# Patient Record
Sex: Male | Born: 1955 | Race: White | Hispanic: No | Marital: Married | Smoking: Former smoker
Health system: Southern US, Community
[De-identification: ages and names within clinical notes are randomized; demographics above are authoritative.]

## PROBLEM LIST (undated history)

## (undated) DIAGNOSIS — K439 Ventral hernia without obstruction or gangrene: Secondary | ICD-10-CM

## (undated) DIAGNOSIS — I739 Peripheral vascular disease, unspecified: Secondary | ICD-10-CM

## (undated) DIAGNOSIS — N200 Calculus of kidney: Secondary | ICD-10-CM

## (undated) DIAGNOSIS — I219 Acute myocardial infarction, unspecified: Secondary | ICD-10-CM

## (undated) DIAGNOSIS — E785 Hyperlipidemia, unspecified: Secondary | ICD-10-CM

## (undated) DIAGNOSIS — T451X5A Adverse effect of antineoplastic and immunosuppressive drugs, initial encounter: Secondary | ICD-10-CM

## (undated) DIAGNOSIS — R110 Nausea: Secondary | ICD-10-CM

## (undated) DIAGNOSIS — E663 Overweight: Secondary | ICD-10-CM

## (undated) DIAGNOSIS — C259 Malignant neoplasm of pancreas, unspecified: Secondary | ICD-10-CM

## (undated) DIAGNOSIS — R9430 Abnormal result of cardiovascular function study, unspecified: Secondary | ICD-10-CM

## (undated) DIAGNOSIS — I2510 Atherosclerotic heart disease of native coronary artery without angina pectoris: Secondary | ICD-10-CM

## (undated) DIAGNOSIS — I779 Disorder of arteries and arterioles, unspecified: Secondary | ICD-10-CM

## (undated) DIAGNOSIS — J90 Pleural effusion, not elsewhere classified: Secondary | ICD-10-CM

## (undated) DIAGNOSIS — IMO0002 Reserved for concepts with insufficient information to code with codable children: Secondary | ICD-10-CM

## (undated) DIAGNOSIS — E119 Type 2 diabetes mellitus without complications: Secondary | ICD-10-CM

## (undated) DIAGNOSIS — K5900 Constipation, unspecified: Secondary | ICD-10-CM

## (undated) DIAGNOSIS — H269 Unspecified cataract: Secondary | ICD-10-CM

## (undated) HISTORY — DX: Hyperlipidemia, unspecified: E78.5

## (undated) HISTORY — DX: Abnormal result of cardiovascular function study, unspecified: R94.30

## (undated) HISTORY — PX: CORONARY ANGIOPLASTY: SHX604

## (undated) HISTORY — PX: KIDNEY STONE SURGERY: SHX686

## (undated) HISTORY — DX: Overweight: E66.3

## (undated) HISTORY — DX: Disorder of arteries and arterioles, unspecified: I77.9

## (undated) HISTORY — PX: ANKLE SURGERY: SHX546

## (undated) HISTORY — PX: INSERTION / PLACEMENT PLEURAL CATHETER: SUR721

## (undated) HISTORY — DX: Ventral hernia without obstruction or gangrene: K43.9

## (undated) HISTORY — DX: Atherosclerotic heart disease of native coronary artery without angina pectoris: I25.10

## (undated) HISTORY — DX: Peripheral vascular disease, unspecified: I73.9

## (undated) HISTORY — DX: Reserved for concepts with insufficient information to code with codable children: IMO0002

---

## 1979-04-17 DIAGNOSIS — N200 Calculus of kidney: Secondary | ICD-10-CM

## 1979-04-17 HISTORY — DX: Calculus of kidney: N20.0

## 2004-04-16 HISTORY — PX: CARDIAC CATHETERIZATION: SHX172

## 2004-04-20 ENCOUNTER — Inpatient Hospital Stay (HOSPITAL_COMMUNITY): Admission: EM | Admit: 2004-04-20 | Discharge: 2004-04-23 | Payer: Self-pay | Admitting: Cardiology

## 2004-04-20 ENCOUNTER — Ambulatory Visit: Payer: Self-pay | Admitting: Cardiology

## 2004-05-05 ENCOUNTER — Ambulatory Visit: Payer: Self-pay

## 2004-05-12 ENCOUNTER — Ambulatory Visit: Payer: Self-pay | Admitting: Cardiology

## 2004-06-16 ENCOUNTER — Ambulatory Visit: Payer: Self-pay | Admitting: Cardiology

## 2004-08-08 ENCOUNTER — Ambulatory Visit: Payer: Self-pay | Admitting: Cardiology

## 2004-11-09 ENCOUNTER — Ambulatory Visit: Payer: Self-pay | Admitting: Cardiology

## 2005-01-23 ENCOUNTER — Ambulatory Visit: Payer: Self-pay | Admitting: Cardiology

## 2005-05-28 ENCOUNTER — Ambulatory Visit: Payer: Self-pay | Admitting: Cardiology

## 2005-08-07 ENCOUNTER — Ambulatory Visit: Payer: Self-pay | Admitting: Cardiology

## 2005-12-19 ENCOUNTER — Ambulatory Visit: Payer: Self-pay | Admitting: Cardiology

## 2006-06-05 ENCOUNTER — Emergency Department (HOSPITAL_COMMUNITY): Admission: EM | Admit: 2006-06-05 | Discharge: 2006-06-05 | Payer: Self-pay | Admitting: Emergency Medicine

## 2006-08-28 ENCOUNTER — Ambulatory Visit: Payer: Self-pay | Admitting: Cardiology

## 2006-08-28 LAB — CONVERTED CEMR LAB
ALT: 22 units/L (ref 0–40)
AST: 24 units/L (ref 0–37)
Albumin: 4.4 g/dL (ref 3.5–5.2)
Alkaline Phosphatase: 37 units/L — ABNORMAL LOW (ref 39–117)
Total Bilirubin: 0.7 mg/dL (ref 0.3–1.2)
Total CHOL/HDL Ratio: 2.2

## 2006-09-10 ENCOUNTER — Ambulatory Visit: Payer: Self-pay

## 2007-09-11 ENCOUNTER — Ambulatory Visit: Payer: Self-pay | Admitting: Cardiology

## 2007-09-11 LAB — CONVERTED CEMR LAB
Albumin: 4.1 g/dL (ref 3.5–5.2)
Alkaline Phosphatase: 53 units/L (ref 39–117)
LDL Cholesterol: 49 mg/dL (ref 0–99)
Total Bilirubin: 1 mg/dL (ref 0.3–1.2)
Total CHOL/HDL Ratio: 2.5
Triglycerides: 41 mg/dL (ref 0–149)

## 2008-01-28 ENCOUNTER — Ambulatory Visit: Payer: Self-pay | Admitting: Cardiology

## 2008-01-28 LAB — CONVERTED CEMR LAB
ALT: 25 units/L (ref 0–53)
Cholesterol: 91 mg/dL (ref 0–200)
HDL: 43.4 mg/dL (ref >39.0)
Total Bilirubin: 1 mg/dL (ref 0.3–1.2)
Total Protein: 6.5 g/dL (ref 6.0–8.3)
VLDL: 7 mg/dL (ref 0–40)

## 2008-09-21 DIAGNOSIS — E785 Hyperlipidemia, unspecified: Secondary | ICD-10-CM

## 2008-09-21 DIAGNOSIS — E663 Overweight: Secondary | ICD-10-CM | POA: Insufficient documentation

## 2008-09-22 ENCOUNTER — Ambulatory Visit: Payer: Self-pay | Admitting: Cardiology

## 2008-09-23 ENCOUNTER — Encounter: Payer: Self-pay | Admitting: Cardiology

## 2008-10-15 ENCOUNTER — Telehealth: Payer: Self-pay | Admitting: Cardiology

## 2009-09-19 ENCOUNTER — Encounter: Payer: Self-pay | Admitting: Cardiology

## 2009-09-22 ENCOUNTER — Ambulatory Visit: Payer: Self-pay | Admitting: Cardiology

## 2009-09-28 ENCOUNTER — Encounter: Payer: Self-pay | Admitting: Cardiology

## 2010-05-06 ENCOUNTER — Encounter: Payer: Self-pay | Admitting: Emergency Medicine

## 2010-05-14 LAB — CONVERTED CEMR LAB
Albumin: 4.3 g/dL (ref 3.5–5.2)
Bilirubin, Direct: 0.1 mg/dL (ref 0.0–0.3)
Cholesterol: 99 mg/dL (ref 0–200)
HDL: 43.2 mg/dL (ref >39.00)
HDL: 50.4 mg/dL (ref >39.00)
LDL Cholesterol: 43 mg/dL (ref 0–99)
Total Bilirubin: 0.7 mg/dL (ref 0.3–1.2)
Total CHOL/HDL Ratio: 2
Triglycerides: 35 mg/dL (ref 0.0–149.0)
Triglycerides: 51 mg/dL (ref 0.0–149.0)
VLDL: 7 mg/dL (ref 0.0–40.0)

## 2010-05-16 NOTE — Assessment & Plan Note (Signed)
Summary: yearly/sl   Visit Type:  Follow-up Primary Provider:  Selena Batten, MD  CC:  CAD.  History of Present Illness: The patient is seen for followup coronary artery disease.  He is feeling well.  I saw him last night, 2011.  His lipids were checked that day and they were excellent.  He's not having any chest pain.  He is fully active.  He had a drug-eluting stent in 2006.  Nuclear study in 2008 showed no ischemia.  He stopped smoking on the day of his MI in 2006.  He has not restarted.  He has chosen to remain on Plavix along with his aspirin.  I support this.  Current Medications (verified): 1)  Niaspan 1000 Mg Cr-Tabs (Niacin (Antihyperlipidemic)) .... One Tablet By Mouth Once Daily 2)  Altace 2.5 Mg Caps (Ramipril) .... Take 1 Capsule By Mouth Once A Day 3)  Lipitor 40 Mg Tabs (Atorvastatin Calcium) .... Take One Tablet By Mouth Daily. 4)  Metoprolol Succinate 25 Mg Xr24h-Tab (Metoprolol Succinate) .... Take 1 Tablet By Mouth Once A Day 5)  Plavix 75 Mg Tabs (Clopidogrel Bisulfate) .... Take 1 Tablet By Mouth Once A Day 6)  Nitroglycerin 0.4 Mg Subl (Nitroglycerin) .... One Tablet Under Tongue Every 5 Minutes As Needed For Chest Pain---May Repeat Times Three 7)  Aspirin 81 Mg Tbec (Aspirin) .... Take One Tablet By Mouth Daily  Allergies (verified): No Known Drug Allergies  Past History:  Past Medical History:  OVERWEIGHT (ICD-278.02) DYSLIPIDEMIA (ICD-272.4) * EF 50%.... catheterization.. 2006 /  62%.... nuclear... 2008 CAD (ICD-414.00)  [name] (DES) stent  2006 /  nuclear stress.. may, 2008... EF 62%..... no ischemia smoking....stopped  Review of Systems       Patient denies fever, chills, headache, sweats, rash, change in vision, change in hearing, chest pain, nausea vomiting, urinary symptoms, shortness of breath.  All other systems are reviewed and are negative.  Vital Signs:  Patient profile:   55 year old male Height:      74 inches Weight:      257 pounds BMI:      33.12 Pulse rate:   62 / minute Pulse rhythm:   regular BP sitting:   114 / 70  (left arm) Cuff size:   large  Vitals Entered By: Judithe Modest CMA (September 22, 2009 8:33 AM)  Physical Exam  General:  patient is mildly overweight but in good shape and stable. Head:  head is atraumatic. Eyes:  no xanthelasma. Neck:  no jugular venous distention. Chest Wall:  no chest wall tenderness. Lungs:  lungs are clear.  Respiratory effort is nonlabored. Heart:  cardiac exam reveals S1-S2.  No clicks or significant murmurs. Abdomen:  abdomen is soft. Msk:  no musculoskeletal deformities. Extremities:  no peripheral edema. Skin:  no skin rashes. Psych:  patient is oriented to person time and place.  Affect is normal.   Impression & Recommendations:  Problem # 1:  OVERWEIGHT (ICD-278.02) Patient is encouraged to try to lose a little weight.  Problem # 2:  DYSLIPIDEMIA (ICD-272.4)  His updated medication list for this problem includes:    Niaspan 1000 Mg Cr-tabs (Niacin (antihyperlipidemic)) ..... One tablet by mouth once daily    Lipitor 40 Mg Tabs (Atorvastatin calcium) .Marland Kitchen... Take one tablet by mouth daily. By history the lipids have been under excellent control.  He is fasting this morning.  Fasting lipid profile will be obtained.  Orders: TLB-Hepatic/Liver Function Pnl (80076-HEPATIC) TLB-Lipid Panel (80061-LIPID)  Problem #  3:  CAD (ICD-414.00)  His updated medication list for this problem includes:    Altace 2.5 Mg Caps (Ramipril) .Marland Kitchen... Take 1 capsule by mouth once a day    Metoprolol Succinate 25 Mg Xr24h-tab (Metoprolol succinate) .Marland Kitchen... Take 1 tablet by mouth once a day    Plavix 75 Mg Tabs (Clopidogrel bisulfate) .Marland Kitchen... Take 1 tablet by mouth once a day    Nitroglycerin 0.4 Mg Subl (Nitroglycerin) ..... One tablet under tongue every 5 minutes as needed for chest pain---may repeat times three    Aspirin 81 Mg Tbec (Aspirin) .Marland Kitchen... Take one tablet by mouth daily Coronary disease  is stable.  EKG is done today and reviewed by me.  There is no significant abnormality.  He will remain on aspirin.  The remain on Plavix as he would like to and I am in agreement.  Orders: EKG w/ Interpretation (93000) TLB-Hepatic/Liver Function Pnl (80076-HEPATIC) TLB-Lipid Panel (80061-LIPID)  Problem # 4:  * EF 50% There is no reason to assess LV function at this time.  EF was 62% by nuclear scan in 2008.  I will see him for cardiology followup in one year.  Patient Instructions: 1)  Labs today 2)  Follow up in 1 year

## 2010-05-16 NOTE — Letter (Signed)
Summary: Custom - Lipid  [facility], Main Office  [address]   [place], [place] [address]   Phone: [phone]  Fax: [phone]     September 28, 2009 MRN: [id number]   Jimmy Christensen [address] [place], [place]  [address]   Dear Jimmy Christensen,  We have reviewed your cholesterol results.  They are as follows:     Total Cholesterol:    99 (Desirable: less than 200)       HDL  Cholesterol:     43.20  (Desirable: greater than 40 for men and 50 for women)       LDL Cholesterol:       49  (Desirable: less than 100 for low risk and less than 70 for moderate to high risk)       Triglycerides:       35.0  (Desirable: less than 150)  Our recommendations include:  Looks Great, continue current medications   Call our office at the number listed above if you have any questions.  Lowering your LDL cholesterol is important, but it is only one of a large number of "risk factors" that may indicate that you are at risk for heart disease, stroke or other complications of hardening of the arteries.  Other risk factors include:   A.  Cigarette Smoking* B.  High Blood Pressure* C.  Obesity* D.   Low HDL Cholesterol (see yours above)* E.   Diabetes Mellitus (higher risk if your is uncontrolled) F.  Family history of premature heart disease G.  Previous history of stroke or cardiovascular disease    *These are risk factors YOU HAVE CONTROL OVER.  For more information, visit .  There is now evidence that lowering the TOTAL CHOLESTEROL AND LDL CHOLESTEROL can reduce the risk of heart disease.  The American Heart Association recommends the following guidelines for the treatment of elevated cholesterol:  1.  If there is now current heart disease and less than two risk factors, TOTAL CHOLESTEROL should be less than 200 and LDL CHOLESTEROL should be less than 100. 2.  If there is current heart disease or two or more risk factors, TOTAL CHOLESTEROL should be less than 200 and LDL  CHOLESTEROL should be less than 70.  A diet low in cholesterol, saturated fat, and calories is the cornerstone of treatment for elevated cholesterol.  Cessation of smoking and exercise are also important in the management of elevated cholesterol and preventing vascular disease.  Studies have shown that 30 to 60 minutes of physical activity most days can help lower blood pressure, lower cholesterol, and keep your weight at a healthy level.  Drug therapy is used when cholesterol levels do not respond to therapeutic lifestyle changes (smoking cessation, diet, and exercise) and remains unacceptably high.  If medication is started, it is important to have you levels checked periodically to evaluate the need for further treatment options.  Thank you,    [employer] Team

## 2010-05-16 NOTE — Miscellaneous (Signed)
  Clinical Lists Changes  Observations: Added new observation of PAST MED HX:  OVERWEIGHT (ICD-278.02) DYSLIPIDEMIA (ICD-272.4) * EF 50%.... catheterization.. 2006 /  62%.... nuclear... 2008 CAD (ICD-414.00)  [name] (DES) stent  2006 /  nuclear stress.. may, 2008... EF 62%..... no ischemia smoking....stopped  (09/19/2009 12:18) Added new observation of PRIMARY MD: Selena Batten, MD (09/19/2009 12:18)       Past History:  Past Medical History:  OVERWEIGHT (ICD-278.02) DYSLIPIDEMIA (ICD-272.4) * EF 50%.... catheterization.. 2006 /  62%.... nuclear... 2008 CAD (ICD-414.00)  [name] (DES) stent  2006 /  nuclear stress.. may, 2008... EF 62%..... no ischemia smoking....stopped

## 2010-08-29 NOTE — Assessment & Plan Note (Signed)
[facility]                            CARDIOLOGY OFFICE NOTE   NAME:Jimmy Christensen, Jimmy Christensen                           MRN:          [id number]  DATE:08/28/2006                            DOB:          November 14, 1955    Mr. Sharps is doing well.  He has known coronary disease.  He received a  [name] stent in January 2006.  He has had no significant symptoms since  then.  He has not had any further followup testing.  I will talk with  him once again about proceeding with an exercise test.  He is on long-  term Plavix at this point and tolerating his medicines well.  He also is  on Lipitor and Niaspan.  He has had a good result with his lipids.  In  September, however, his HDL was slightly low at 39.  We will repeat it  at this point and then decide if we will give him a higher dose of  Niaspan as he is currently on 1 g.  LV function has been good at 50%.  He has not had any signs of heart failure.   PAST MEDICAL HISTORY:  Allergies:  No known drug allergies.   Medications:  1. Plavix 75.  2. Aspirin 325.  His aspirin dose can be reduced to 81.  3. Toprol-XL 25.  4. Altace 2.5, and we will be sure that he knows that this is now      generic.  5. Lipitor 40.  6. Niaspan one at night.   Other medical problems:  See the list below.   REVIEW OF SYSTEMS:  He is feeling well and has no significant problems.  He asks me about peripheral arterial disease.  I explained to him that  he had no symptoms of PAD.  However, we are treating any potential  vascular disease with all of the secondary prevention that we are doing  and I reviewed all of this with him.   PHYSICAL EXAMINATION:  VITAL SIGNS:  Weight is 233 pounds, similar to  last year.  Blood pressure 116/66 with a pulse of 58.  GENERAL:  The patient is oriented to person, time and place.  Affect is  normal.  HEENT:  Reveals no xanthelasma.  He has normal extraocular motion.  He  has normal conjunctivae.  There are  no carotid bruits.  There is no  jugular venous distention.  LUNGS:  Clear.  Respiratory effort is not labored.  CARDIAC:  Reveals an S1 with an S2.  There are no clicks or significant  murmurs.  ABDOMEN:  Soft.  He has normal bowel sounds.  MUSCULOSKELETAL:  There is trace edema in his right ankle which is due  to an old injury.  There is no edema in the left ankle.  There are no  other musculoskeletal deformities.   EKG shows sinus bradycardia.  He is quite stable with this.  We checked  to see if there were any further labs since September 2007 and I do not  have any further.   PROBLEMS:  1.  Coronary disease.  He received a [name] stent in the past.  Plan      has been for long-term Plavix.  I need to get a copy of his      catheterization report and re-review it.  We will keep him on      Plavix now.  Aspirin dose will be reduced.  2. Ejection fraction 50%.  3. Dyslipidemia.  This is being treated well.  Will obtain a fasting      lipid profile and then be in touch with him as to whether or not I      do want to change his Niaspan dosing or not.  4. History of tobacco use that was stopped.   It has now been since his [name] stent in January 2005.  He did have  residual 40% LAD disease.  We will plan for a stress Myoview as long as  he is in agreement.     Luis Abed, MD, [facility]     JDK/MedQ  DD: 08/28/2006  DT: 08/28/2006  Job #: [id number]   cc:   Pam Drown, M.D.

## 2010-08-29 NOTE — Assessment & Plan Note (Signed)
Jimmy Christensen                            CARDIOLOGY OFFICE NOTE   NAME:Jimmy Christensen, Lacasse                           MRN:          [id number]  DATE:09/11/2007                            DOB:          [birth year]    Mr. Egger is doing well.  He is post PCI in the past.  In my prior  notes, I had mentioned looking for his cath report.  I still do not have  the exact report.  I know that he received a [name] stent in the past.  He is not having chest pain.  He has no shortness of breath.  His  exercise program is actually quite good.  He is exercising on a regular  basis.  His weight is up.  We talked about losing weight, and he says he  will work on trying to do this.  I explained to him all the rationale  behind weight loss including all the negative aspects of having excess  adipose tissue related to the cardiovascular system.   PAST MEDICAL HISTORY:   ALLERGIES:  No known drug allergies.   MEDICATIONS:  1. Plavix 75.  2. Toprol 25.  3. Altace 2.5.  4. Lipitor 40.  5. Niaspan 1 g.  6. Aspirin 325 (to be switched back to 81 mg daily).   OTHER MEDICAL PROBLEMS:  See the complete list below.   REVIEW OF SYSTEMS:  He has no GI or GU symptoms.  He is going about full  activity.  He is stable today.   Otherwise, review of systems is negative.   PHYSICAL EXAMINATION:  VITAL SIGNS:  Blood pressure today is 122/71.  His weight is 249 pounds.  Pulse is 54.  GENERAL:  The patient is oriented to person, time, and place.  Affect is  normal.  HEENT:  Reveals no xanthelasma.  He has normal extraocular motion.  NECK:  There are no carotid bruits.  There is no jugular venous  distention.  LUNGS:  Clear.  Respiratory effort is not labored.  CARDIAC:  Reveals S1 and S2.  There are no clicks or significant  murmurs.  ABDOMEN:  Protuberant but soft.  There are no masses or bruits.  EXTREMITIES:  He has no significant peripheral edema.  MUSCULOSKELETAL:  There are no  musculoskeletal deformities.   EKG reveals mild sinus bradycardia.  His nuclear scan from Sep 10, 2006,  looked good.  He exercised for 11 minutes.  There was no sign of  ischemia.  He had a good ejection fraction.   PROBLEMS:  1. Coronary artery disease post [name] stent in 2006.  He is doing      well.  He continues on aspirin and Plavix.  Once again, I will try      to obtain his cath report.  When I see him next year, we will begin      to talk about whether his Plavix can be stopped.  2. Ejection fraction 50%.  3. Dyslipidemia.  We will draw fasting lipids with liver panel today.  4. History of tobacco use  in the past that he stopped.  5. Excess weight.  We had significant discussions about his approach      to weight loss.   He is stable overall.  I will see him back in 1 year.     Luis Abed, MD, Jimmy Christensen  Electronically Signed    JDK/MedQ  DD: 09/11/2007  DT: 09/11/2007  Job #: [id number]   cc:   Pam Drown, M.D.

## 2010-09-01 NOTE — Discharge Summary (Signed)
NAMEMarland Kitchen  Jimmy Christensen, Jimmy Christensen NO.:  [id number]   MEDICAL RECORD NO.:  [id number]          PATIENT TYPE:  INP   LOCATION:  2021                         FACILITY:  [facility]   PHYSICIAN:  Willa Rough, M.D.     DATE OF BIRTH:  04/20/1955   DATE OF ADMISSION:  04/20/2004  DATE OF DISCHARGE:  04/23/2004                                 DISCHARGE SUMMARY   DATE OF ADMISSION:  April 20, 2004.   DATE OF DISCHARGE:  April 23, 2004.   PROCEDURES:  1.  Cardiac catheterization.  2. Coronary arteriogram.  3. Left      ventriculogram.   HOSPITAL COURSE:  Jimmy Christensen is a 55 year old male with no known history of  coronary artery disease.  He had onset of substernal chest pain at  approximately 1:00 p.m.  He went to Urgent Care and was sent from there to  [facility].  His EKG was abnormal and Dr. Myrtis Ser was contracted.  He was  brought to [facility] for emergent catheterization.   The cardiac catheterization showed an RCA that was totaled proximally.  It  was treated with PTCA and a cypher stent reducing the stenosis to less than  10% with TIMI 3 flow.  He had no other critical disease but there was a 40%  stenosis in the mid LAD.  His EF was approximately 50% with inferior  akinesis.   Jimmy Christensen did well post cath.  He had no chest pain or shortness of breath.  He is motivated to quit smoking and states that he will.  He was seen by  cardiac rehab and given instructions on MI and stent restrictions, use of  sublingual nitroglycerin, calling 911, and exercise guidelines.   By April 23, 2004, Jimmy Christensen was ambulating without chest pain or shortness  of breath.  He was considered stable for discharge and is to follow up as an  outpatient.   DISCHARGE DIAGNOSES:  1.  Acute inferior myocardial infarction, CK  MP peak 1207/209 with a      troponin I peak of 31.72.  2.  Hyperlipidemia with a total cholesterol of 164, triglycerides 88, HDL      34, LDL 112.  3.  Ongoing tobacco use.  4.   Family history of coronary artery disease.  5.  Status post tonsillectomy, ankle surgery, and kidney stone surgery.   DISCHARGE INSTRUCTIONS:  1.  His activity level is to include no strenuous activity until cleared by      M.D. and no driving for five days.  He is to call our office for      problems with the cath site.  He is to stick to a low fat diet.  He is      to follow up with Dr. Willa Rough as well as Dr. Gweneth Dimitri.   DISCHARGE MEDICATIONS:  1.  Aspirin 325 mg daily.  2.  Plavix 75 mg daily.  3.  Lipitor 80 mg daily.  4.  Toprol-XL 25 mg daily.  5.  Sublingual nitroglycerin p.r.n.  RB/MEDQ  D:  04/23/2004  T:  04/23/2004  Job:  [id number]   cc:   Pam Drown, M.D.  [address]  [place]  [place] [address]  Fax: [phone]   Willa Rough, M.D.

## 2010-09-01 NOTE — Cardiovascular Report (Signed)
NAME:  ABDIEL, BLACKERBY NO.:  [id number]   MEDICAL RECORD NO.:  [id number]          PATIENT TYPE:  INP   LOCATION:  2852                         FACILITY:  [facility]   PHYSICIAN:  Charlies Constable, M.D. [facility] DATE OF BIRTH:  1956/02/03   DATE OF PROCEDURE:  04/21/2004  DATE OF DISCHARGE:                              CARDIAC CATHETERIZATION   HISTORY:  Mr. Lopezgarcia is a 55 year old who has no prior known history of heart  disease.  He had the onset of chest pain at 1:30, and was seen later in [facility]  after[facility] where his EKG suggested a diaphragmatic  wall infarction and he was transferred to [facility].  Dr. Myrtis Ser  was consulted and arranged for him to come to the catheter laboratory for  intervention.  The ECG showed 1 mm ST elevation in 3, and inverted T-waves  in 2, 3, and aVF.  He was given 600 mg of aspirin and heparin prior to  transfer.  He has a strong positive family history for coronary disease and  is a smoker.   DESCRIPTION OF PROCEDURE:  The procedure was performed by the right femoral  artery using arterial sheath and 6 French pre-formed coronary catheters.  Femoral arterial puncture was performed and Omnipaque contrast was used.  After completion of the diagnostic study we made a decision to do  intervention on the totally occluded right coronary artery.   The patient was given additional heparin following HCT greater than 200  seconds, and was given double bolused Integrilin infusion.  We used a 6  [place] guiding catheter with side holes and a __________wire.  We  crossed the lesion with the wire without difficulty.  This did not re-  establish reperfusion.  We then dotted the lesion with a 3 x 20 mm Maverick,  and this also did not re-establish reperfusion.  We then inflated the  balloon up to 8 atmospheres by 30 seconds and this established reperfusion  with TIMI III flow.  We then used a 3.5 x 18 mm [name] stent and deployed  this at the lesion of the proximal right coronary artery with one inflation  up to 16 atmospheres for 30 seconds.  We post-dilated with a 4.5 x 15 mm  Quantum Maverick, performing three inflations up to 15 atmospheres by 30  seconds.  Repeat diagnostics were then performed through the guiding  catheter.  The patient tolerated the procedure well and left the laboratory  in satisfactory condition.   RESULTS:  1.  Left main coronary artery is free of significant disease.  2.  The left anterior descending artery gave rise to four diagonal branches      and two septal perforators.  There was 40% narrowing in the mid left      anterior descending artery.  3.  The circumflex artery gave rise to a small marginal branch, large      marginal branch, and a small posterior lateral branch.  These vessels      were irregular, but there is no significant obstruction.  4.  The right coronary artery gave rise to a clonus branch and a right      ventricular branch, and then was completely occluded.  There were      collaterals filling the distal right coronary artery from the left      coronary artery and the right coronary artery appeared quite large.   LEFT VENTRICULOGRAM:  The left ventriculogram performed in the RAO  projection showed akinesis of the inferior wall.  The apex and anterior  lateral wall move well.  The estimated ejection fraction was 50%.  The  aortic pressure was 129/78, with mean of 100.  Left ventricular pressure was  129/24.   Following stenting of the lesion in the mid right coronary artery this  vessel improved from 100% to less than 10%, and the flow improved from TIMI  0 to TIMI III flow.  Distal vessel was a large vessel consisting of  posterior descending and four moderately large posterior lateral branches.  There was a nice myocardial blush at the end of the procedure.   The patient had the onset of chest pain at 1330, and arrived at [facility]  Emergency Room at 1944.   The first balloon inflation was performed at 2332.  This gave a door-window time of 3 hours and 48 minutes, and a reperfusion  time of 10 hours and 2 minutes.   CONCLUSION:  1.  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  2.  Successful reperfusion and stenting of the proximal right coronary      artery using a [name] drug-eluding stent with improvement in narrowing      from 100% to less than 10%.   DISPOSITION:  The patient is_______________for further observation.  We will  classify the patient as low risk and target discharge on day #2.       BB/MEDQ  D:  04/21/2004  T:  04/21/2004  Job:  [id number]   cc:   Willa Rough, M.D.   Pam Drown, M.D.  [address]  [place]  [place] [address]  Fax: [phone]   Cardiopulmonary Lab

## 2010-09-01 NOTE — H&P (Signed)
NAME:  Jimmy Christensen, Jimmy Christensen NO.:  [id number]   MEDICAL RECORD NO.:  [id number]          PATIENT TYPE:  EMS   LOCATION:  ED                           FACILITY:  [facility]   PHYSICIAN:  Willa Rough, M.D.     DATE OF BIRTH:  [birth year]   DATE OF ADMISSION:  04/20/2004  DATE OF DISCHARGE:                                HISTORY & PHYSICAL   HISTORY OF PRESENT ILLNESS:  Mr. Baranowski is a pleasant 55 year old gentleman  who has had chest pain today since 1:00.  He had some chest discomfort  earlier this week.  Today, it was much more prolonged and he was seen and  brought to the [facility].  His symptoms were felt  to be cardiac and I was called immediately, and decision is made to treat  him aggressively.   PAST MEDICAL HISTORY:  Allergies:  No known drug allergies.  Medications:  The patient does not take any medications on a regular basis.  He was given  some pulmonary medications recently.  Other medical problems:  See the  complete list below.   SOCIAL HISTORY:  The patient works with [employer], dispensing.  He smokes until  today.  He does not drink alcohol excessively and he does not use any  street drugs.   FAMILY HISTORY:  The family history is strong for coronary disease in the  patient's mother and siblings.   REVIEW OF SYSTEMS:  Overall, he feels well.  He has not had any major  headaches.  He has had no major GI symptoms or GU symptoms.  Overall, other  than his current chest pain, his review of systems is negative.   PHYSICAL EXAMINATION:  GENERAL:  The patient is having some mild ongoing  chest pain.  VITAL SIGNS:  His blood pressure is 112/70, pulse 60.  HEENT:  No marked abnormalities.  LUNGS:  Clear.  NECK:  Normal.  CARDIAC:  An S1 and S2 but no clicks or significant murmurs.  ABDOMEN:  Benign.  He has no peripheral edema.  There are no musculoskeletal  deformities.  NEUROLOGIC:  Grossly intact.   LABORATORY DATA:  EKG reveals 1 mm of  inferior ST elevation with inverted T  waves.  CPK is in the range of 400 with an MB in the range of 50.  Renal  function is normal.  Hemoglobin is normal.  Baseline coagulations are  pending prior to his heparin bolus.   PROBLEMS:  1.  Smoking history.  The patient and his wife both say that they will be      stopping at this time.  2.  Strong family history of coronary disease.  3.  Acute DMI.  I believe that his symptoms started at approximately 1:00      today; however, they have persisted for      a long period of time, and he still has some symptoms.  He will be      treated as an acute ST elevation infarct.  He is treated with heparin  and beta blocker.  He is now receiving 600 mg of p.o. Plavix.  He will      not be receiving IIB, IIIA inhibitors as he is headed straight to the      catheterization lab under the care of Dr. Juanda Chance.       JK/MEDQ  D:  04/20/2004  T:  04/20/2004  Job:  [id number]

## 2010-09-15 ENCOUNTER — Other Ambulatory Visit: Payer: Self-pay | Admitting: *Deleted

## 2010-09-15 MED ORDER — RAMIPRIL 2.5 MG PO CAPS: 2.5000 mg | ORAL_CAPSULE | Freq: Every day | ORAL | Status: DC

## 2010-09-15 MED ORDER — NIACIN ER (ANTIHYPERLIPIDEMIC) 1000 MG PO TBCR: 1000.0000 mg | EXTENDED_RELEASE_TABLET | Freq: Every day | ORAL | Status: DC

## 2010-09-15 MED ORDER — ATORVASTATIN CALCIUM 40 MG PO TABS: 40.0000 mg | ORAL_TABLET | Freq: Every day | ORAL | Status: DC

## 2010-09-15 MED ORDER — CLOPIDOGREL BISULFATE 75 MG PO TABS: 75.0000 mg | ORAL_TABLET | Freq: Every day | ORAL | Status: DC

## 2010-09-15 MED ORDER — METOPROLOL SUCCINATE ER 25 MG PO TB24: 25.0000 mg | ORAL_TABLET | Freq: Every day | ORAL | Status: DC

## 2010-09-19 ENCOUNTER — Encounter: Payer: Self-pay | Admitting: Cardiology

## 2010-10-12 ENCOUNTER — Encounter: Payer: Self-pay | Admitting: Cardiology

## 2010-10-12 DIAGNOSIS — I2510 Atherosclerotic heart disease of native coronary artery without angina pectoris: Secondary | ICD-10-CM | POA: Insufficient documentation

## 2010-10-12 DIAGNOSIS — R9430 Abnormal result of cardiovascular function study, unspecified: Secondary | ICD-10-CM | POA: Insufficient documentation

## 2010-10-13 ENCOUNTER — Ambulatory Visit (INDEPENDENT_AMBULATORY_CARE_PROVIDER_SITE_OTHER): Payer: BC Managed Care – PPO | Admitting: Cardiology

## 2010-10-13 ENCOUNTER — Encounter: Payer: Self-pay | Admitting: *Deleted

## 2010-10-13 ENCOUNTER — Encounter: Payer: Self-pay | Admitting: Cardiology

## 2010-10-13 DIAGNOSIS — I2510 Atherosclerotic heart disease of native coronary artery without angina pectoris: Secondary | ICD-10-CM

## 2010-10-13 DIAGNOSIS — E785 Hyperlipidemia, unspecified: Secondary | ICD-10-CM

## 2010-10-13 LAB — LIPID PANEL
Cholesterol: 103 mg/dL (ref 0–200)
VLDL: 12.6 mg/dL (ref 0.0–40.0)

## 2010-10-13 NOTE — Patient Instructions (Signed)
Lab today  Your physician wants you to follow-up in: 1 year.  You will receive a reminder letter in the mail two months in advance. If you don't receive a letter, please call our office to schedule the follow-up appointment.  

## 2010-10-13 NOTE — Progress Notes (Signed)
HPI Patient is seen for followup of coronary disease.  He is doing extremely well.  He received a drug-eluting stent in 2006.  Nuclear exercise test in 2008 revealed no ischemia.  Is not having chest pain or shortness of breath.  His blood pressure is under good control.  His lipids are excellent.  I have reviewed labs from earlier this year showing a total cholesterol of 102 with an LDL of 49 and HDL 42 and triglycerides 54.  This is on Niaspan and Lipitor 40.  The meds will be continued. No Known Allergies  Current Outpatient Prescriptions  Medication Sig Dispense Refill  . aspirin 81 MG EC tablet Take 81 mg by mouth daily.        Marland Kitchen atorvastatin (LIPITOR) 40 MG tablet Take 1 tablet (40 mg total) by mouth daily.  90 tablet  3  . clopidogrel (PLAVIX) 75 MG tablet Take 1 tablet (75 mg total) by mouth daily.  90 tablet  3  . metoprolol succinate (TOPROL XL) 25 MG 24 hr tablet Take 1 tablet (25 mg total) by mouth daily.  90 tablet  3  . niacin (NIASPAN) 1000 MG CR tablet Take 1 tablet (1,000 mg total) by mouth at bedtime.  90 tablet  3  . nitroGLYCERIN (NITROSTAT) 0.4 MG SL tablet Place 0.4 mg under the tongue every 5 (five) minutes as needed. May repeat up to 3 doses.       . ramipril (ALTACE) 2.5 MG capsule Take 1 capsule (2.5 mg total) by mouth daily.  90 capsule  3    History   Social History  . Marital Status: Married    Spouse Name: N/A    Number of Children: N/A  . Years of Education: N/A   Occupational History  . Not on file.   Social History Main Topics  . Smoking status: Former Games developer  . Smokeless tobacco: Not on file  . Alcohol Use:   . Drug Use:   . Sexually Active:    Other Topics Concern  . Not on file   Social History Narrative  . No narrative on file    No family history on file.  Past Medical History  Diagnosis Date  . Overweight   . Dyslipidemia   . CAD (coronary artery disease)     [name] (DES) stent 2006; Nuclear stress 05/08, EF 62%, no ischemia  .  Ejection fraction     50% catheterization 2006 /  60% nuclear, 2008    Past Surgical History  Procedure Date  . Cardiac catheterization 2006    EF 50%; 62% nuclear 2008    ROS  Patient denies fever, chills, headache, sweats, rash, change in vision, change in hearing, chest pain, cough, nausea vomiting, urinary symptoms.  All other systems are reviewed and are negative.  PHYSICAL EXAM Patient is overweight.  He is trying to lose some weight.  He is oriented to person time and place.  Affect is normal.  Head is atraumatic.  There is no xanthelasma.  Lungs are clear.  Respiratory effort is unlabored.  Cardiac exam reveals S1 and S2.  No clicks or significant murmurs.  The abdomen is soft.  There is no peripheral edema. Filed Vitals:   10/13/10 0855  BP: 130/76  Pulse: 55  Height: 6\' 2"  (1.88 m)  Weight: 261 lb 12.8 oz (118.752 kg)    EKG Is done today.  There is mild sinus bradycardia.  It is normal.  It is reviewed by me.  ASSESSMENT & PLAN

## 2010-10-13 NOTE — Assessment & Plan Note (Signed)
He has excellent control of his lipids.  No change in therapy.

## 2010-10-13 NOTE — Assessment & Plan Note (Signed)
Coronary disease is stable.  No further workup.  When I see him back next year we will consider a followup stress test.

## 2011-05-06 ENCOUNTER — Other Ambulatory Visit: Payer: Self-pay

## 2011-05-06 ENCOUNTER — Encounter (HOSPITAL_BASED_OUTPATIENT_CLINIC_OR_DEPARTMENT_OTHER): Payer: Self-pay | Admitting: Emergency Medicine

## 2011-05-06 ENCOUNTER — Inpatient Hospital Stay (HOSPITAL_BASED_OUTPATIENT_CLINIC_OR_DEPARTMENT_OTHER)
Admission: EM | Admit: 2011-05-06 | Discharge: 2011-05-07 | DRG: 143 | Disposition: A | Discharge status: Home / Self Care | Payer: BC Managed Care – PPO | Attending: Cardiology | Admitting: Cardiology

## 2011-05-06 ENCOUNTER — Emergency Department (INDEPENDENT_AMBULATORY_CARE_PROVIDER_SITE_OTHER): Payer: BC Managed Care – PPO

## 2011-05-06 DIAGNOSIS — Z9861 Coronary angioplasty status: Secondary | ICD-10-CM

## 2011-05-06 DIAGNOSIS — I252 Old myocardial infarction: Secondary | ICD-10-CM

## 2011-05-06 DIAGNOSIS — I2510 Atherosclerotic heart disease of native coronary artery without angina pectoris: Secondary | ICD-10-CM | POA: Diagnosis present

## 2011-05-06 DIAGNOSIS — Z8679 Personal history of other diseases of the circulatory system: Secondary | ICD-10-CM

## 2011-05-06 DIAGNOSIS — E669 Obesity, unspecified: Secondary | ICD-10-CM | POA: Diagnosis present

## 2011-05-06 DIAGNOSIS — R0789 Other chest pain: Principal | ICD-10-CM | POA: Diagnosis present

## 2011-05-06 DIAGNOSIS — E785 Hyperlipidemia, unspecified: Secondary | ICD-10-CM | POA: Insufficient documentation

## 2011-05-06 DIAGNOSIS — I200 Unstable angina: Secondary | ICD-10-CM

## 2011-05-06 DIAGNOSIS — R079 Chest pain, unspecified: Secondary | ICD-10-CM

## 2011-05-06 DIAGNOSIS — I10 Essential (primary) hypertension: Secondary | ICD-10-CM | POA: Diagnosis present

## 2011-05-06 HISTORY — DX: Acute myocardial infarction, unspecified: I21.9

## 2011-05-06 HISTORY — DX: Calculus of kidney: N20.0

## 2011-05-06 LAB — BASIC METABOLIC PANEL
BUN: 10 mg/dL (ref 6–23)
Calcium: 9.6 mg/dL (ref 8.4–10.5)
Creatinine, Ser: 0.9 mg/dL (ref 0.50–1.35)
GFR calc Af Amer: >90 mL/min (ref >90)
GFR calc non Af Amer: >90 mL/min (ref >90)
Glucose, Bld: 116 mg/dL — ABNORMAL HIGH (ref 70–99)

## 2011-05-06 LAB — CBC
HCT: 40.9 % (ref 39.0–52.0)
Hemoglobin: 14.8 g/dL (ref 13.0–17.0)
MCH: 32.2 pg (ref 26.0–34.0)
MCHC: 36.2 g/dL — ABNORMAL HIGH (ref 30.0–36.0)
MCV: 88.9 fL (ref 78.0–100.0)
RDW: 12.4 % (ref 11.5–15.5)

## 2011-05-06 MED ORDER — CLOPIDOGREL BISULFATE 75 MG PO TABS
75.0000 mg | ORAL_TABLET | Freq: Every day | ORAL | Status: DC
  Administered 2011-05-07: 75 mg via ORAL
  Filled 2011-05-06: qty 1

## 2011-05-06 MED ORDER — ASPIRIN 81 MG PO CHEW
CHEWABLE_TABLET | ORAL | Status: AC
  Administered 2011-05-06: 324 mg via ORAL
  Filled 2011-05-06: qty 4

## 2011-05-06 MED ORDER — ASPIRIN 81 MG PO TBEC: 325.0000 mg | DELAYED_RELEASE_TABLET | Freq: Every day | ORAL | Status: DC

## 2011-05-06 MED ORDER — NIACIN ER (ANTIHYPERLIPIDEMIC) 500 MG PO TBCR
1000.0000 mg | EXTENDED_RELEASE_TABLET | Freq: Every day | ORAL | Status: DC
  Administered 2011-05-06: 1000 mg via ORAL
  Filled 2011-05-06 (×2): qty 2

## 2011-05-06 MED ORDER — ROSUVASTATIN CALCIUM 20 MG PO TABS
20.0000 mg | ORAL_TABLET | Freq: Every day | ORAL | Status: DC
  Administered 2011-05-06: 20 mg via ORAL
  Filled 2011-05-06 (×2): qty 1

## 2011-05-06 MED ORDER — HEPARIN SOD (PORCINE) IN D5W 100 UNIT/ML IV SOLN
1700.0000 [IU]/h | INTRAVENOUS | Status: DC
  Administered 2011-05-06: 1550 [IU]/h via INTRAVENOUS
  Filled 2011-05-06 (×3): qty 250

## 2011-05-06 MED ORDER — ONDANSETRON HCL 4 MG/2ML IJ SOLN: 4.0000 mg | Freq: Four times a day (QID) | INTRAMUSCULAR | Status: DC | PRN

## 2011-05-06 MED ORDER — ASPIRIN EC 325 MG PO TBEC
325.0000 mg | DELAYED_RELEASE_TABLET | Freq: Every day | ORAL | Status: DC
  Filled 2011-05-06: qty 1

## 2011-05-06 MED ORDER — HEPARIN BOLUS VIA INFUSION
4000.0000 [IU] | Freq: Once | INTRAVENOUS | Status: AC
  Administered 2011-05-06: 4000 [IU] via INTRAVENOUS
  Filled 2011-05-06: qty 4000

## 2011-05-06 MED ORDER — ISOSORBIDE MONONITRATE 15 MG HALF TABLET
15.0000 mg | ORAL_TABLET | Freq: Every day | ORAL | Status: DC
  Administered 2011-05-07: 15 mg via ORAL
  Filled 2011-05-06: qty 1

## 2011-05-06 MED ORDER — NITROGLYCERIN 0.4 MG SL SUBL: 0.4000 mg | SUBLINGUAL_TABLET | SUBLINGUAL | Status: DC | PRN

## 2011-05-06 MED ORDER — METOPROLOL SUCCINATE ER 25 MG PO TB24
25.0000 mg | ORAL_TABLET | Freq: Every day | ORAL | Status: DC
  Administered 2011-05-07: 25 mg via ORAL
  Filled 2011-05-06: qty 1

## 2011-05-06 MED ORDER — RAMIPRIL 2.5 MG PO CAPS
2.5000 mg | ORAL_CAPSULE | Freq: Every day | ORAL | Status: DC
  Administered 2011-05-07: 2.5 mg via ORAL
  Filled 2011-05-06: qty 1

## 2011-05-06 MED ORDER — ACETAMINOPHEN 325 MG PO TABS
650.0000 mg | ORAL_TABLET | ORAL | Status: DC | PRN
  Administered 2011-05-07: 650 mg via ORAL
  Filled 2011-05-06: qty 2

## 2011-05-06 MED ORDER — NITROGLYCERIN 0.4 MG SL SUBL
0.4000 mg | SUBLINGUAL_TABLET | SUBLINGUAL | Status: DC | PRN
  Administered 2011-05-06 (×2): 0.4 mg via SUBLINGUAL
  Filled 2011-05-06: qty 25

## 2011-05-06 MED ORDER — ASPIRIN 81 MG PO CHEW
324.0000 mg | CHEWABLE_TABLET | Freq: Once | ORAL | Status: AC
  Administered 2011-05-06: 324 mg via ORAL

## 2011-05-06 NOTE — H&P (Signed)
Jimmy Christensen is an 57 y.o. male with a history of CAD s/p PCI in 2006 Chief Complaint: Chest pain HPI: Patient was in his usual state of health until this afternoon when he started having chest pains while sitting down and not doing anything in particular. Chest pain is left sided, located just underneath his left breast and is radiating to the left axilla. The chest pain was described as a tightness and was 2/10 in intensity whereas the pain in the axilla was described as burning in character. There have been no noticeable aggravation or relieving factors. Pain lasts 2-3 minutes, disappearing for about 10 minutes before recurring. He denies any other symptoms. Specifically he denies nausea or vomiting, diaphoresis or dizziness. His exercise tolerance had remained unchanged and he denied leg edema, orthopnea or PND. He has been taking all his meds regularly and there was no recent stressful event.  His cardiac history is significant for CAD resulting in PCI in 2006. At the time, he had been having upper chest pain somewhat similar to what he has now but worse in intensity. He had the pain for 2 weeks before he presented to the hospital. He showed up at [facility] and was found to have ST elevation in the inferior leads. He had a cypher stent to his RCA.His last known EF was normal and a stress test done in 2008 was also normal.   Past Medical History  Diagnosis Date  . Overweight   . Dyslipidemia   . CAD (coronary artery disease)     [name] (DES) stent 2006; Nuclear stress 05/08, EF 62%, no ischemia  . Ejection fraction     50% catheterization 2006 /  60% nuclear, 2008  . MI (myocardial infarction)   . Kidney stones   . Headache     Past Surgical History  Procedure Date  . Cardiac catheterization 2006    EF 50%; 62% nuclear 2008  . Kidney stone surgery   . Ankle surgery   . Coronary angioplasty     No family history on file. Social History:  reports that he has quit smoking. He  does not have any smokeless tobacco history on file. He reports that he drinks alcohol. He reports that he does not use illicit drugs.  Allergies: No Known Allergies  Medications Prior to Admission  Medication Dose Route Frequency Provider Last Rate Last Dose  . aspirin chewable tablet 324 mg  324 mg Oral Once Hilario Quarry, MD   324 mg at 05/06/11 1526  . nitroGLYCERIN (NITROSTAT) SL tablet 0.4 mg  0.4 mg Sublingual Q5 min PRN Hilario Quarry, MD   0.4 mg at 05/06/11 1625   Medications Prior to Admission  Medication Sig Dispense Refill  . aspirin 81 MG EC tablet Take 81 mg by mouth daily.        Marland Kitchen atorvastatin (LIPITOR) 40 MG tablet Take 1 tablet (40 mg total) by mouth daily.  90 tablet  3  . clopidogrel (PLAVIX) 75 MG tablet Take 1 tablet (75 mg total) by mouth daily.  90 tablet  3  . metoprolol succinate (TOPROL XL) 25 MG 24 hr tablet Take 1 tablet (25 mg total) by mouth daily.  90 tablet  3  . niacin (NIASPAN) 1000 MG CR tablet Take 1 tablet (1,000 mg total) by mouth at bedtime.  90 tablet  3  . nitroGLYCERIN (NITROSTAT) 0.4 MG SL tablet Place 0.4 mg under the tongue every 5 (five) minutes as needed. May  repeat up to 3 doses.       . ramipril (ALTACE) 2.5 MG capsule Take 1 capsule (2.5 mg total) by mouth daily.  90 capsule  3    Results for orders placed during the hospital encounter of 05/06/11 (from the past 48 hour(s))  CBC     Status: Abnormal   Collection Time   05/06/11  3:25 PM      Component Value Range Comment   WBC 7.9  4.0 - 10.5 (K/uL)    RBC 4.60  4.22 - 5.81 (MIL/uL)    Hemoglobin 14.8  13.0 - 17.0 (g/dL)    HCT 16.1  09.6 - 04.5 (%)    MCV 88.9  78.0 - 100.0 (fL)    MCH 32.2  26.0 - 34.0 (pg)    MCHC 36.2 (*) 30.0 - 36.0 (g/dL)    RDW 40.9  81.1 - 91.4 (%)    Platelets 240  150 - 400 (K/uL)   BASIC METABOLIC PANEL     Status: Abnormal   Collection Time   05/06/11  3:25 PM      Component Value Range Comment   Sodium 140  135 - 145 (mEq/L)    Potassium 4.2  3.5 -  5.1 (mEq/L)    Chloride 105  96 - 112 (mEq/L)    CO2 27  19 - 32 (mEq/L)    Glucose, Bld 116 (*) 70 - 99 (mg/dL)    BUN 10  6 - 23 (mg/dL)    Creatinine, Ser 7.82  0.50 - 1.35 (mg/dL)    Calcium 9.6  8.4 - 10.5 (mg/dL)    GFR calc non Af Amer >90  >90 (mL/min)    GFR calc Af Amer >90  >90 (mL/min)   TROPONIN I     Status: Normal   Collection Time   05/06/11  3:25 PM      Component Value Range Comment   Troponin I <0.30  <0.30 (ng/mL)    Dg Chest Portable 1 View  05/06/2011  *RADIOLOGY REPORT*  Clinical Data: Left chest pain.  History of coronary artery disease and myocardial infarction.  PORTABLE CHEST - 1 VIEW  Comparison: None.  Findings: 1535 hours.  The heart size and mediastinal contours are normal.  There is mild linear atelectasis or scarring at the left lung base.  The lungs are otherwise clear.  There is no pleural effusion or pneumothorax.  No acute osseous findings are identified.  Telemetry leads overlie the chest.  IMPRESSION: No active cardiopulmonary process.  Original Report Authenticated By: Gerrianne Scale, M.D.    Review of Systems  Constitutional: Negative.   HENT: Negative.   Eyes: Negative.   Respiratory: Negative.   Gastrointestinal:       He has been burping a lot in the last 3 months.  Genitourinary: Negative.   Musculoskeletal: Negative.   Skin: Negative.   Neurological: Negative.   Endo/Heme/Allergies: Negative.   Psychiatric/Behavioral: Negative.     Blood pressure 106/66, pulse 64, temperature 98.5 F (36.9 C), temperature source Oral, resp. rate 16, height 6\' 2"  (1.88 m), weight 259 lb 14.8 oz (117.9 kg), SpO2 98.00%. Physical Exam  Constitutional: He is oriented to person, place, and time. He appears well-developed and well-nourished. No distress.  HENT:  Head: Normocephalic.  Mouth/Throat: No oropharyngeal exudate.  Eyes: Conjunctivae and EOM are normal. Pupils are equal, round, and reactive to light. No scleral icterus.  Neck: Normal range  of motion. Neck supple. No JVD present. No  thyromegaly present.  Cardiovascular: Normal rate, regular rhythm, normal heart sounds and intact distal pulses.  Exam reveals no gallop and no friction rub.   No murmur heard. Respiratory: Effort normal and breath sounds normal. No respiratory distress. He has no wheezes. He has no rales. He exhibits no tenderness.  GI: Soft. Bowel sounds are normal. There is no tenderness.  Musculoskeletal: Normal range of motion. He exhibits no edema and no tenderness.  Neurological: He is alert and oriented to person, place, and time.  Skin: Skin is warm and dry. No rash noted. He is not diaphoretic. No erythema. No pallor.  Psychiatric: He has a normal mood and affect. His behavior is normal. Judgment and thought content normal.    EKG Sinus, early r wave transition in lead V2, no ST segment depression or elevation.  LHC 04/21/2004 LM- Normal LAD- Mid 40%. Branches- 4 Diags, 2 septal perforators LCX- Normal Branches - Small marginal, large marginal and small PL RCA- proximal 100% occlusion after the conus and RV branches. + collaterals from left. Had DES stent to proximal RCA with restoration of normal flow revealing large territory Inferior wall hypokinesis EF 50%   Assessment/Plan ACS ( Unstable Angina)   Patient with remote Inferior wall STEMI s/p PCI to RCA,  on ASA and Plavix who is  having stuttering chest pain somewhat similar to what he had at the time of the MI. Will start him on heparin infusion and add Isosorbide mononitrate to his PRN NTG I will continue his home meds (ASA, Plavix, ramipril, metoprolol, statin) Will give him a 600mg  dose of plavix now Serial EKGs and cardiac enzymes NPO after midnight If pain persists inspite of current therapy, will consider cardiac catheterization. If however, pain quickly resolves, will order stress test in the morning.     Grandville Silos 05/06/2011, 9:09 PM

## 2011-05-06 NOTE — ED Notes (Signed)
Report to Vernona Rieger on [address] [facility]

## 2011-05-06 NOTE — Progress Notes (Signed)
ANTICOAGULATION CONSULT NOTE - Initial Consult  Pharmacy Consult for Heparin Indication: chest pain/ACS  No Known Allergies  Patient Measurements: Height: 6\' 2"  (188 cm) Weight: 259 lb 14.8 oz (117.9 kg) IBW/kg (Calculated) : 82.2    Vital Signs: Temp: 98.1 F (36.7 C) (01/20 2100) Temp src: Oral (01/20 2100) BP: 117/69 mmHg (01/20 2100) Pulse Rate: 72  (01/20 2100)  Labs:  Basename 05/06/11 1525  HGB 14.8  HCT 40.9  PLT 240  APTT --  LABPROT --  INR --  HEPARINUNFRC --  CREATININE 0.90  CKTOTAL --  CKMB --  TROPONINI <0.30   Estimated Creatinine Clearance: 128.1 ml/min (by C-G formula based on Cr of 0.9).  Medical History: Past Medical History  Diagnosis Date  . Overweight   . Dyslipidemia   . CAD (coronary artery disease)     [name] (DES) stent 2006; Nuclear stress 05/08, EF 62%, no ischemia  . Ejection fraction     50% catheterization 2006 /  60% nuclear, 2008  . MI (myocardial infarction)   . Kidney stones   . Headache     Medications:  Prescriptions prior to admission  Medication Sig Dispense Refill  . aspirin 81 MG EC tablet Take 81 mg by mouth daily.        Marland Kitchen atorvastatin (LIPITOR) 40 MG tablet Take 40 mg by mouth daily.      . clopidogrel (PLAVIX) 75 MG tablet Take 75 mg by mouth daily.      . metoprolol succinate (TOPROL-XL) 25 MG 24 hr tablet Take 25 mg by mouth daily.      . niacin (NIASPAN) 1000 MG CR tablet Take 1,000 mg by mouth at bedtime.      . nitroGLYCERIN (NITROSTAT) 0.4 MG SL tablet Place 0.4 mg under the tongue every 5 (five) minutes as needed. May repeat up to 3 doses.       . ramipril (ALTACE) 2.5 MG capsule Take 2.5 mg by mouth daily.      Marland Kitchen DISCONTD: atorvastatin (LIPITOR) 40 MG tablet Take 1 tablet (40 mg total) by mouth daily.  90 tablet  3  . DISCONTD: clopidogrel (PLAVIX) 75 MG tablet Take 1 tablet (75 mg total) by mouth daily.  90 tablet  3  . DISCONTD: metoprolol succinate (TOPROL XL) 25 MG 24 hr tablet Take 1 tablet (25  mg total) by mouth daily.  90 tablet  3  . DISCONTD: niacin (NIASPAN) 1000 MG CR tablet Take 1 tablet (1,000 mg total) by mouth at bedtime.  90 tablet  3  . DISCONTD: ramipril (ALTACE) 2.5 MG capsule Take 1 capsule (2.5 mg total) by mouth daily.  90 capsule  3    Assessment: 56 year old man to be started on IV heparin for ACS Goal of Therapy:  Heparin level 0.3-0.7 units/ml   Plan:  4000 units bolus followed by a 1550 unit/hr drip.  Check heparin level in 6 hours.  Adjust per goal.  Daily heparin level and CBC while on heparin.  Mickeal Skinner 05/06/2011,9:54 PM

## 2011-05-06 NOTE — Progress Notes (Signed)
At 1902 pt c/o 2/10 chest pain to left side of his chest. 12 lead ekg done without acute changes noted as compared to ekg from [employer]. Pt given 1 SL NTG with relief of pain with in 10 min. Bp pre ntg was 148/79 hr 63 sats 100% on 2L [place]. His BP post NTG was 125/64. Fellows MD on call made aware of Patients arrival and pain. Will cont to monitor closely.

## 2011-05-06 NOTE — ED Provider Notes (Signed)
History   This chart was scribed for Jimmy Quarry, MD by Melba Coon. The patient was seen in room MH05/MH05 and the patient's care was started at 3:38PM.    CSN: [id number]  Arrival date & time 05/06/11  1414   First MD Initiated Contact with Patient 05/06/11 1530      Chief Complaint  Patient presents with  . Chest Pain    (Consider location/radiation/quality/duration/timing/severity/associated sxs/prior treatment) HPI Jimmy Christensen is a 56 y.o. male who presents to the Emergency Department complaining of intermittent moderate to severe left sternal anterior pain with an onset 3 hours ago. Pt has had previous similar episode of pain when his right coronary artery was blocked and had MI in 2006. No heart problems or catherizations since then. Pt rates the severity 2/10. Pt takes ASA everyday, but not today. Standing up alleviates the pain; sitting down aggravates the pain. No n/v/d, HTN or high cholesterol.  PCP: Med clinic in [facility]  Past Medical History  Diagnosis Date  . Overweight   . Dyslipidemia   . CAD (coronary artery disease)     [name] (DES) stent 2006; Nuclear stress 05/08, EF 62%, no ischemia  . Ejection fraction     50% catheterization 2006 /  60% nuclear, 2008  . MI (myocardial infarction)     Past Surgical History  Procedure Date  . Cardiac catheterization 2006    EF 50%; 62% nuclear 2008  . Kidney stone surgery   . Ankle surgery     No family history on file. Family history of heart problems History  Substance Use Topics  . Smoking status: Former Games developer  . Smokeless tobacco: Not on file  . Alcohol Use: Yes     rare      Review of Systems 10 Systems reviewed and are negative for acute change except as noted in the HPI.  Allergies  Review of patient's allergies indicates no known allergies.  Home Medications   Current Outpatient Rx  Name Route Sig Dispense Refill  . ASPIRIN 81 MG PO TBEC Oral Take 81 mg by mouth daily.      .  ATORVASTATIN CALCIUM 40 MG PO TABS Oral Take 1 tablet (40 mg total) by mouth daily. 90 tablet 3  . CLOPIDOGREL BISULFATE 75 MG PO TABS Oral Take 1 tablet (75 mg total) by mouth daily. 90 tablet 3  . METOPROLOL SUCCINATE ER 25 MG PO TB24 Oral Take 1 tablet (25 mg total) by mouth daily. 90 tablet 3  . NIACIN ER (ANTIHYPERLIPIDEMIC) 1000 MG PO TBCR Oral Take 1 tablet (1,000 mg total) by mouth at bedtime. 90 tablet 3  . NITROGLYCERIN 0.4 MG SL SUBL Sublingual Place 0.4 mg under the tongue every 5 (five) minutes as needed. May repeat up to 3 doses.     Marland Kitchen RAMIPRIL 2.5 MG PO CAPS Oral Take 1 capsule (2.5 mg total) by mouth daily. 90 capsule 3    BP 118/60  Pulse 64  Temp(Src) 98.5 F (36.9 C) (Oral)  Resp 9  Ht 6\' 2"  (1.88 m)  Wt 260 lb (117.935 kg)  BMI 33.38 kg/m2  SpO2 99%  Physical Exam  Constitutional: He is oriented to person, place, and time. He appears well-developed.  HENT:  Head: Normocephalic and atraumatic.  Eyes: Conjunctivae and EOM are normal. Pupils are equal, round, and reactive to light. No scleral icterus.  Neck: Normal range of motion. Neck supple. No thyromegaly present.  Cardiovascular: Normal rate and regular rhythm.  Exam reveals  no gallop and no friction rub.   No murmur heard. Pulmonary/Chest: Effort normal and breath sounds normal. No stridor. He has no wheezes. He has no rales. He exhibits no tenderness.  Abdominal: Soft. He exhibits no distension. There is no tenderness. There is no rebound.  Musculoskeletal: Normal range of motion. He exhibits no edema.  Lymphadenopathy:    He has no cervical adenopathy.  Neurological: He is oriented to person, place, and time. Coordination normal.  Skin: No rash noted. No erythema.  Psychiatric: He has a normal mood and affect. His behavior is normal.    ED Course  Procedures (including critical care time)  DIAGNOSTIC STUDIES: Oxygen Saturation is 100% on room air, normal; by my interpretation.    COORDINATION OF  CARE:   Results for orders placed during the hospital encounter of 05/06/11  CBC      Component Value Range   WBC 7.9  4.0 - 10.5 (K/uL)   RBC 4.60  4.22 - 5.81 (MIL/uL)   Hemoglobin 14.8  13.0 - 17.0 (g/dL)   HCT 16.1  09.6 - 04.5 (%)   MCV 88.9  78.0 - 100.0 (fL)   MCH 32.2  26.0 - 34.0 (pg)   MCHC 36.2 (*) 30.0 - 36.0 (g/dL)   RDW 40.9  81.1 - 91.4 (%)   Platelets 240  150 - 400 (K/uL)  BASIC METABOLIC PANEL      Component Value Range   Sodium 140  135 - 145 (mEq/L)   Potassium 4.2  3.5 - 5.1 (mEq/L)   Chloride 105  96 - 112 (mEq/L)   CO2 27  19 - 32 (mEq/L)   Glucose, Bld 116 (*) 70 - 99 (mg/dL)   BUN 10  6 - 23 (mg/dL)   Creatinine, Ser 7.82  0.50 - 1.35 (mg/dL)   Calcium 9.6  8.4 - 95.6 (mg/dL)   GFR calc non Af Amer >90  >90 (mL/min)   GFR calc Af Amer >90  >90 (mL/min)  TROPONIN I      Component Value Range   Troponin I <0.30  <0.30 (ng/mL)     Dg Chest Portable 1 View  05/06/2011  *RADIOLOGY REPORT*  Clinical Data: Left chest pain.  History of coronary artery disease and myocardial infarction.  PORTABLE CHEST - 1 VIEW  Comparison: None.  Findings: 1535 hours.  The heart size and mediastinal contours are normal.  There is mild linear atelectasis or scarring at the left lung base.  The lungs are otherwise clear.  There is no pleural effusion or pneumothorax.  No acute osseous findings are identified.  Telemetry leads overlie the chest.  IMPRESSION: No active cardiopulmonary process.  Original Report Authenticated By: Gerrianne Scale, M.D.     No diagnosis found. Willa Rough, MD 10/13/2010 1:39 PM Signed  HPI  Patient is seen for followup of coronary disease. He is doing extremely well. He received a drug-eluting stent in 2006. Nuclear exercise test in 2008 revealed no ischemia. Is not having chest pain or shortness of breath. His blood pressure is under good control. His lipids are excellent. I have reviewed labs from earlier this year showing a total cholesterol of  102 with an LDL of 49 and HDL 42 and triglycerides 54. This is on Niaspan and Lipitor 40. The meds will be continued.  No Known Allergies  Current Outpatient Prescriptions   Medication  Sig  Dispense  Refill   .  aspirin 81 MG EC tablet  Take 81 mg by  mouth daily.     Marland Kitchen  atorvastatin (LIPITOR) 40 MG tablet  Take 1 tablet (40 mg total) by mouth daily.  90 tablet  3   .  clopidogrel (PLAVIX) 75 MG tablet  Take 1 tablet (75 mg total) by mouth daily.  90 tablet  3   .  metoprolol succinate (TOPROL XL) 25 MG 24 hr tablet  Take 1 tablet (25 mg total) by mouth daily.  90 tablet  3   .  niacin (NIASPAN) 1000 MG CR tablet  Take 1 tablet (1,000 mg total) by mouth at bedtime.  90 tablet  3   .  nitroGLYCERIN (NITROSTAT) 0.4 MG SL tablet  Place 0.4 mg under the tongue every 5 (five) minutes as needed. May repeat up to 3 doses.     .  ramipril (ALTACE) 2.5 MG capsule  Take 1 capsule (2.5 mg total) by mouth daily.  90 capsule  3    History    Results for orders placed during the hospital encounter of 05/06/11  CBC      Component Value Range   WBC 7.9  4.0 - 10.5 (K/uL)   RBC 4.60  4.22 - 5.81 (MIL/uL)   Hemoglobin 14.8  13.0 - 17.0 (g/dL)   HCT 40.9  81.1 - 91.4 (%)   MCV 88.9  78.0 - 100.0 (fL)   MCH 32.2  26.0 - 34.0 (pg)   MCHC 36.2 (*) 30.0 - 36.0 (g/dL)   RDW 78.2  95.6 - 21.3 (%)   Platelets 240  150 - 400 (K/uL)  BASIC METABOLIC PANEL      Component Value Range   Sodium 140  135 - 145 (mEq/L)   Potassium 4.2  3.5 - 5.1 (mEq/L)   Chloride 105  96 - 112 (mEq/L)   CO2 27  19 - 32 (mEq/L)   Glucose, Bld 116 (*) 70 - 99 (mg/dL)   BUN 10  6 - 23 (mg/dL)   Creatinine, Ser 0.86  0.50 - 1.35 (mg/dL)   Calcium 9.6  8.4 - 57.8 (mg/dL)   GFR calc non Af Amer >90  >90 (mL/min)   GFR calc Af Amer >90  >90 (mL/min)  TROPONIN I      Component Value Range   Troponin I <0.30  <0.30 (ng/mL)   Dg Chest Portable 1 View  05/06/2011  *RADIOLOGY REPORT*  Clinical Data: Left chest pain.  History of  coronary artery disease and myocardial infarction.  PORTABLE CHEST - 1 VIEW  Comparison: None.  Findings: 1535 hours.  The heart size and mediastinal contours are normal.  There is mild linear atelectasis or scarring at the left lung base.  The lungs are otherwise clear.  There is no pleural effusion or pneumothorax.  No acute osseous findings are identified.  Telemetry leads overlie the chest.  IMPRESSION: No active cardiopulmonary process.  Original Report Authenticated By: Gerrianne Scale, M.D.     MDM   Date: 05/06/2011  Rate: 68  Rhythm: normal sinus rhythm with frequent pvcs  QRS Axis: normal  Intervals: normal  ST/T Wave abnormalities: normal  Conduction Disutrbances:none  Narrative Interpretation:   Old EKG Reviewed: none available   Patient with unclear pain here seemed to be waxing and waning to about 2/10.  Nitro x 2 given with resolution.  ASA given prehospital.    I personally performed the services described in this documentation, which was scribed in my presence. The recorded information has been reviewed and considered.  Jimmy Quarry, MD 05/06/11 [phone]

## 2011-05-06 NOTE — ED Notes (Signed)
Pt c/o LT CP while sitting at desk approx 1 hr ago; denies other sx, sts he has been burping a lot over the last month

## 2011-05-06 NOTE — ED Notes (Signed)
Pt sts. He thinks the "tightness" he felt earlier was the cardiac lead wires; once he moved them, that feeling was gone

## 2011-05-07 ENCOUNTER — Inpatient Hospital Stay (HOSPITAL_COMMUNITY): Payer: BC Managed Care – PPO

## 2011-05-07 ENCOUNTER — Other Ambulatory Visit: Payer: Self-pay

## 2011-05-07 ENCOUNTER — Telehealth: Payer: Self-pay | Admitting: Cardiology

## 2011-05-07 DIAGNOSIS — E669 Obesity, unspecified: Secondary | ICD-10-CM | POA: Diagnosis present

## 2011-05-07 DIAGNOSIS — R079 Chest pain, unspecified: Secondary | ICD-10-CM

## 2011-05-07 DIAGNOSIS — I517 Cardiomegaly: Secondary | ICD-10-CM

## 2011-05-07 DIAGNOSIS — I200 Unstable angina: Secondary | ICD-10-CM

## 2011-05-07 LAB — PROTIME-INR: INR: 1.11 (ref 0.00–1.49)

## 2011-05-07 LAB — CARDIAC PANEL(CRET KIN+CKTOT+MB+TROPI)
CK, MB: 3.6 ng/mL (ref 0.3–4.0)
CK, MB: 3.7 ng/mL (ref 0.3–4.0)
Total CK: 197 U/L (ref 7–232)
Troponin I: <0.3 ng/mL (ref <0.30)
Troponin I: <0.3 ng/mL (ref <0.30)

## 2011-05-07 LAB — BASIC METABOLIC PANEL
BUN: 11 mg/dL (ref 6–23)
CO2: 25 mEq/L (ref 19–32)
Chloride: 107 mEq/L (ref 96–112)
Creatinine, Ser: 0.79 mg/dL (ref 0.50–1.35)

## 2011-05-07 LAB — CBC
HCT: 41.4 % (ref 39.0–52.0)
MCHC: 35 g/dL (ref 30.0–36.0)
MCV: 90.6 fL (ref 78.0–100.0)
RDW: 12.3 % (ref 11.5–15.5)
WBC: 7.8 10*3/uL (ref 4.0–10.5)

## 2011-05-07 LAB — LIPID PANEL
Cholesterol: 103 mg/dL (ref 0–200)
Triglycerides: 54 mg/dL (ref <150)

## 2011-05-07 MED ORDER — TECHNETIUM TC 99M TETROFOSMIN IV KIT
10.0000 | PACK | Freq: Once | INTRAVENOUS | Status: AC | PRN
  Administered 2011-05-07: 10 via INTRAVENOUS

## 2011-05-07 MED ORDER — PANTOPRAZOLE SODIUM 40 MG PO TBEC: 40.0000 mg | DELAYED_RELEASE_TABLET | Freq: Every day | ORAL | Status: DC

## 2011-05-07 MED ORDER — TECHNETIUM TC 99M TETROFOSMIN IV KIT
30.0000 | PACK | Freq: Once | INTRAVENOUS | Status: AC | PRN
  Administered 2011-05-07: 30 via INTRAVENOUS

## 2011-05-07 NOTE — Telephone Encounter (Signed)
Note opened in error.

## 2011-05-07 NOTE — Discharge Summary (Signed)
Discharge Summary   Patient ID: Terel Bann MRN: [id number], DOB/AGE: [birth year] 56 y.o.  Primary MD: Gweneth Dimitri, MD, MD Primary Cardiologist: Willa Rough MD  Admit date: 05/06/2011 D/C date:     05/07/2011     Primary Discharge Diagnoses:  1. Chest pain, noncardiac, felt likely GI  - negative cardiac enzymes x 3   - Exercise myoview 05/07/11- negative for exercise-stress induced ischemia. Left ventricular ejection fraction 60%.    - Initiated PPI  Secondary Discharge Diagnoses:  1. CAD  - s/p [name] DES to RCA 2006  - EF 50% by cath 2006, 60-65% by echo 04/2011 1. Dyslipidemia 2. Kidney stones / kidney stone surgery 3. Headache 4. Ankle surgery  Allergies No Known Allergies  Diagnostic Studies/Procedures:  Nm Myocar Multi W/spect W/wall Motion / Ef    05/07/2011  Findings: The patient did achieve target heart rate. The stress SPECT images demonstrate physiologic distribution of radiopharmaceutical. Rest images demonstrate no perfusion defects. The gated stress SPECT images demonstrate normal left ventricular myocardial thickening.  No focal wall motion abnormality is seen. Calculated left ventricular end-diastolic volume , end- systolic volume 42ml, ejection fraction of 60%.  IMPRESSION:  1. Negative for exercise-stress induced ischemia.  2. Left ventricular ejection fraction 60%.    05/07/11 - 2D Echocardiogram Study Conclusions - Left ventricle: The cavity size was normal. Wall thickness was increased in a pattern of mild LVH. Systolic function was normal. The estimated ejection fraction was in the range of 60% to 65%. Wall motion was normal; there were no regional wall motion abnormalities. Doppler parameters are consistent with abnormal left ventricular relaxation (grade 1 diastolic dysfunction). - Left atrium: The atrium was mildly dilated. - Right atrium: The atrium was mildly dilated.   History of Present Illness: 56 y.o. male w/ PMHx significant for CAD, HTN,  HLD, who presented to [facility] on 05/06/11 with complaints of chest pain.  He complained of left sided CP underneath his left breast radiating to left axilla, 2/10 described as burning. There were no aggravating or relieving factors. He denies any other symptoms including n/v, diaphoresis or dizziness. His exercise tolerance had remained unchanged and he denied leg edema, orthopnea or PND. He has been taking all his meds regularly and there was no recent stressful event.   Hospital Course: In the ED his EKG showed NSR with early r wave transition in lead V2, no ST segment depression or elevation. He was admitted to the hospital for rule-out and placed on IV heparin and long-acting nitrate. Home meds were continued. He was re-loaded with Plavix. Cardiac enzymes remained negative. His pain was felt somewhat atypical, possibly GI in nature. He went for stress myoview today demonstrating no exercise-stress induced ischemia and normal LV function. His BP increased to 203 systolic but he had not taken his beta blocker that morning. Dr. Shirlee Latch would like him to continue monitoring his BP at home. Nitrate was discontinued and PPI was added. Dr. Shirlee Latch has seen & examined him today and feels he is stable for discharge.  Discharge Vitals: Blood pressure 105/61, pulse 66, temperature 98.1 F (36.7 C), temperature source Oral, resp. rate 16, height 6\' 2"  (1.88 m), weight 259 lb 14.8 oz (117.9 kg), SpO2 98.00%.  Labs: Lab Results  Component Value Date   WBC 7.8 05/07/2011   HGB 14.5 05/07/2011   HCT 41.4 05/07/2011   MCV 90.6 05/07/2011   PLT 203 05/07/2011    Lab 05/07/11 0600  NA 140  K 3.6  CL 107  CO2 25  BUN 11  CREATININE 0.79  CALCIUM 9.0  GLUCOSE 105*    Basename 05/07/11 1245 05/07/11 0600 05/06/11 2226 05/06/11 1525  CKTOTAL 197 188 186 --  CKMB 3.7 3.6 3.7 --  TROPONINI <0.30 <0.30 <0.30 <0.30   Component Value Date   CHOL 103 05/07/2011   HDL 45 05/07/2011   LDLCALC 47  05/07/2011   TRIG 54 05/07/2011   Discharge Medications   Medication List  As of 05/07/2011  4:37 PM   TAKE these medications         aspirin 81 MG EC tablet   Take 81 mg by mouth daily.      atorvastatin 40 MG tablet   Commonly known as: LIPITOR   Take 40 mg by mouth daily.      clopidogrel 75 MG tablet   Commonly known as: PLAVIX   Take 75 mg by mouth daily.      metoprolol succinate 25 MG 24 hr tablet   Commonly known as: TOPROL-XL   Take 25 mg by mouth daily.      niacin 1000 MG CR tablet   Commonly known as: NIASPAN   Take 1,000 mg by mouth at bedtime.      nitroGLYCERIN 0.4 MG SL tablet   Commonly known as: NITROSTAT   Place 0.4 mg under the tongue every 5 (five) minutes as needed. May repeat up to 3 doses.      pantoprazole 40 MG tablet   Commonly known as: PROTONIX   Take 1 tablet (40 mg total) by mouth daily.      ramipril 2.5 MG capsule   Commonly known as: ALTACE   Take 2.5 mg by mouth daily.            Disposition   Discharge Orders    Future Appointments: Provider: Department: Dept Phone: Center:   06/21/2011 10:00 AM Luis Abed, MD [facility] [phone] [facility]     Future Orders Please Complete By Expires   Diet - low sodium heart healthy      Increase activity slowly      Discharge instructions      Comments:   **PLEASE REMEMBER TO BRING ALL OF YOUR MEDICATIONS TO EACH OF YOUR FOLLOW-UP OFFICE VISITS.  * Please monitor your blood pressure at home.      Follow-up Information    Follow up with Willa Rough, MD. (06/21/11 at  10:00am)    Contact information:   [address] [place] [address] [phone]           Outstanding Labs/Studies: None  Duration of Discharge Encounter: Greater than 30 minutes including physician and PA time.  Signed, Aldair Rickel PA-C 05/07/2011, 4:37 PM

## 2011-05-07 NOTE — Progress Notes (Signed)
*  PRELIMINARY RESULTS* Echocardiogram 2D Echocardiogram has been performed.  Jimmy Christensen [facility] 05/07/2011, 3:39 PM

## 2011-05-07 NOTE — Progress Notes (Signed)
GXT Myoview: Pre test - no CP/SOB With GXT - no CP. SOB with exertion but exercise-limiting factor was leg fatigue.  ECGs reviewed - no ST elevation. Some up-sloping ST depression but no down-sloping.  HR goal achieved in stage 2 - MV injected and exercise continued for > 1 minute more. Final results/images pending. OK to d/c heparin and saline-lock IV. 05/07/2011 11:13 AM

## 2011-05-07 NOTE — Progress Notes (Signed)
Heparin infusion was discontinued therefore not restarted. NS loc flushed with 3 cc NS. Monitor discontinued as per R. Barrett PA. RN Lelon Mast on 3700 notified that Heparin and monitor d/c, and that R Barrett requested lunch tray be waiting for patient on return to 3700.

## 2011-05-07 NOTE — Progress Notes (Signed)
Heparin stopped for stress test.

## 2011-05-07 NOTE — Progress Notes (Signed)
D/c orders received; IV removed with gauze on; pt remains in stable condition; pt meds and instructions reviewed and given to pt; pt d/c home 

## 2011-05-07 NOTE — Progress Notes (Signed)
Patient placed on monitor. In NSR 60's to 70's with some PAC's. Report received from his nurse on 3700. No chest discomfort at present. In w/c. Heparin infusing at 1700 cc per hour. IV site no redness swelling and infusing well.

## 2011-05-07 NOTE — Progress Notes (Signed)
ANTICOAGULATION CONSULT NOTE - Follow Up Consult  Pharmacy Consult for heparin Indication: chest pain/ACS  No Known Allergies  Patient Measurements: Height: 6\' 2"  (188 cm) Weight: 259 lb 14.8 oz (117.9 kg) IBW/kg (Calculated) : 82.2   Vital Signs: Temp: 97.8 F (36.6 C) (01/21 0500) Temp src: Oral (01/21 0500) BP: 110/64 mmHg (01/21 0500) Pulse Rate: 60  (01/21 0500)  Labs:  Basename 05/07/11 0600 05/06/11 2226 05/06/11 1525  HGB 14.5 -- 14.8  HCT 41.4 -- 40.9  PLT 203 -- 240  APTT -- -- --  LABPROT 14.5 -- --  INR 1.11 -- --  HEPARINUNFRC 0.27* -- --  CREATININE -- -- 0.90  CKTOTAL -- 186 --  CKMB -- 3.7 --  TROPONINI -- <0.30 <0.30   Estimated Creatinine Clearance: 128.1 ml/min (by C-G formula based on Cr of 0.9).   Medications:  Scheduled:    . aspirin  324 mg Oral Once  . aspirin EC  325 mg Oral Daily  . clopidogrel  75 mg Oral QAC breakfast  . heparin  4,000 Units Intravenous Once  . isosorbide mononitrate  15 mg Oral Daily  . metoprolol succinate  25 mg Oral Daily  . niacin  1,000 mg Oral QHS  . ramipril  2.5 mg Oral Daily  . rosuvastatin  20 mg Oral Daily  . DISCONTD: aspirin  325 mg Oral Daily   Infusions:    . heparin 1,550 Units/hr (05/06/11 2258)    Assessment: 55yo male slightly subtherapeutic on heparin with initial dosing for CP.  Goal of Therapy:  Heparin level 0.3-0.7 units/ml   Plan:  Will increase heparin gtt by ~2 units/kg/hr to 1700 units/hr and check level in 6hr.  Colleen Can PharmD BCPS 05/07/2011,7:16 AM

## 2011-05-07 NOTE — Progress Notes (Signed)
Patient Name: Jimmy Christensen Date of Encounter: 05/07/2011  Principal Problem:  *ACS (acute coronary syndrome)    SUBJECTIVE:No more CP/SOB, pt had Sx at rest and has previously done "Insanity" in Nov 2012 without CP. He has been walking the golf course without difficulty but the Sx he had were like his previous MI pain.   OBJECTIVE  Filed Vitals:   05/06/11 2000 05/06/11 2100 05/06/11 2140 05/07/11 0500  BP:  117/69  110/64  Pulse:  72  60  Temp:  98.1 F (36.7 C)  97.8 F (36.6 C)  TempSrc:  Oral  Oral  Resp:  18  18  Height: 6\' 2"  (1.88 m)     Weight: 259 lb 14.8 oz (117.9 kg)     SpO2:  99% 99% 97%    Intake/Output Summary (Last 24 hours) at 05/07/11 1610 Last data filed at 05/07/11 0600  Gross per 24 hour  Intake 109.02 ml  Output      0 ml  Net 109.02 ml   Weight change:   PHYSICAL EXAM  General: Well developed, well nourished, in no acute distress. Head: Normocephalic, atraumatic.  Neck: Supple without bruits, no JVD. Lungs:  Resp regular and unlabored, CTA. Heart: RRR no s3, s4, or murmurs. Abdomen: Soft, non-tender, non-distended, BS + x 4.  Extremities: No clubbing, cyanosis, no edema.  Neuro: Alert and oriented X 3. Moves all extremities spontaneously. Psych: Normal affect.  LABS:  CBC: Basename 05/07/11 0600 05/06/11 1525  WBC 7.8 7.9  NEUTROABS -- --  HGB 14.5 14.8  HCT 41.4 40.9  MCV 90.6 88.9  PLT 203 240   Basic Metabolic Panel: Basename 05/07/11 0600 05/06/11 1525  NA 140 140  K 3.6 4.2  CL 107 105  CO2 25 27  GLUCOSE 105* 116*  BUN 11 10  CREATININE 0.79 0.90  CALCIUM 9.0 9.6  MG -- --  PHOS -- --   Cardiac Enzymes: Basename 05/07/11 0600 05/06/11 2226 05/06/11 1525  CKTOTAL 188 186 --  CKMB 3.6 3.7 --  CKMBINDEX -- -- --  TROPONINI <0.30 <0.30 <0.30   Fasting Lipid Panel: Basename 05/07/11 0600  CHOL 103  HDL 45  LDLCALC 47  TRIG 54  CHOLHDL 2.3  LDLDIRECT --   Thyroid Function Tests: No results found for this  basename: TSH,T4TOTAL,FREET3,T3FREE,THYROIDAB in the last 72 hours Anemia Panel: No results found for this basename: VITAMINB12,FOLATE,FERRITIN,TIBC,IRON,RETICCTPCT in the last 72 hours  TELE*SR  Radiology/Studies:  Dg Chest Portable 1 View  05/06/2011  *RADIOLOGY REPORT*  Clinical Data: Left chest pain.  History of coronary artery disease and myocardial infarction.  PORTABLE CHEST - 1 VIEW  Comparison: None.  Findings: 1535 hours.  The heart size and mediastinal contours are normal.  There is mild linear atelectasis or scarring at the left lung base.  The lungs are otherwise clear.  There is no pleural effusion or pneumothorax.  No acute osseous findings are identified.  Telemetry leads overlie the chest.  IMPRESSION: No active cardiopulmonary process.  Original Report Authenticated By: Gerrianne Scale, M.D.    Current Medications:     . aspirin  324 mg Oral Once  . aspirin EC  325 mg Oral Daily  . clopidogrel  75 mg Oral QAC breakfast  . heparin  4,000 Units Intravenous Once  . isosorbide mononitrate  15 mg Oral Daily  . metoprolol succinate  25 mg Oral Daily  . niacin  1,000 mg Oral QHS  . ramipril  2.5 mg Oral  Daily  . rosuvastatin  20 mg Oral Daily  . DISCONTD: aspirin  325 mg Oral Daily    ASSESSMENT AND PLAN: 1. ACS - sx not typical, burping recently but no other GI sx and no other MS sx. Ez neg MI. Go ahead with MV per MD. 2. Other wise - see PMH/H&P.  Signed, Theodore Demark , PA-C  Patient seen with PA, agree with above note.  Atypical chest pain but similar in location to prior.  May be GI.  ETT-myoview today, if negative can go home with trial of PPI.   Marca Ancona 05/07/2011 9:45 AM

## 2011-05-09 ENCOUNTER — Telehealth: Payer: Self-pay | Admitting: Cardiology

## 2011-05-09 NOTE — Telephone Encounter (Signed)
Opened in error

## 2011-06-21 ENCOUNTER — Encounter: Payer: BC Managed Care – PPO | Admitting: Cardiology

## 2011-06-28 ENCOUNTER — Encounter: Payer: Self-pay | Admitting: Cardiology

## 2011-07-02 ENCOUNTER — Telehealth: Payer: Self-pay | Admitting: Cardiology

## 2011-07-02 ENCOUNTER — Encounter: Payer: BC Managed Care – PPO | Admitting: Cardiology

## 2011-07-02 MED ORDER — PANTOPRAZOLE SODIUM 40 MG PO TBEC: 40.0000 mg | DELAYED_RELEASE_TABLET | Freq: Every day | ORAL | Status: DC

## 2011-07-02 NOTE — Telephone Encounter (Signed)
Refill sent electronically.  Pt was notified.

## 2011-07-02 NOTE — Telephone Encounter (Signed)
NEW MESSAGE:  Pt recently in the hosp and needs Pantoprazole sod 40 mg sent to [facility].  Pt was to be seen today but appt had to be re-scheduled per Dr. Myrtis Ser.  Pt has 2 pills left.  Please e scribe as soon as possible.

## 2011-08-05 ENCOUNTER — Encounter: Payer: Self-pay | Admitting: Cardiology

## 2011-08-08 ENCOUNTER — Ambulatory Visit (INDEPENDENT_AMBULATORY_CARE_PROVIDER_SITE_OTHER): Payer: BC Managed Care – PPO | Admitting: Cardiology

## 2011-08-08 ENCOUNTER — Encounter: Payer: Self-pay | Admitting: Cardiology

## 2011-08-08 VITALS — BP 125/69 | HR 78 | Ht 74.0 in | Wt 266.0 lb

## 2011-08-08 DIAGNOSIS — R0989 Other specified symptoms and signs involving the circulatory and respiratory systems: Secondary | ICD-10-CM

## 2011-08-08 DIAGNOSIS — R9430 Abnormal result of cardiovascular function study, unspecified: Secondary | ICD-10-CM

## 2011-08-08 DIAGNOSIS — I2510 Atherosclerotic heart disease of native coronary artery without angina pectoris: Secondary | ICD-10-CM

## 2011-08-08 DIAGNOSIS — E785 Hyperlipidemia, unspecified: Secondary | ICD-10-CM

## 2011-08-08 MED ORDER — NITROGLYCERIN 0.4 MG SL SUBL: 0.4000 mg | SUBLINGUAL_TABLET | SUBLINGUAL | Status: DC | PRN

## 2011-08-08 MED ORDER — NITROGLYCERIN 0.4 MG SL SUBL: 0.4000 mg | SUBLINGUAL_TABLET | SUBLINGUAL | Status: AC | PRN

## 2011-08-08 NOTE — Assessment & Plan Note (Signed)
The patient's lipids are excellent. I've had a careful discussion with him about the current use of niacin. He received this to help increase his HDL. He had a substantial increase his HDL. I explained to him that we are not starting niacin new in some patients at this time but that I felt it was appropriate for him to stay on niacin. He is in full agreement. He feels that it is definitely helped him.

## 2011-08-08 NOTE — Assessment & Plan Note (Signed)
There is a soft left carotid bruit. The patient has documented coronary disease. He has never had a carotid Doppler. This will be scheduled and will be in touch with the results. Otherwise I'll see him back in one year.

## 2011-08-08 NOTE — Patient Instructions (Signed)
Your physician wants you to follow-up in: 1 year.   You will receive a reminder letter in the mail two months in advance. If you don't receive a letter, please call our office to schedule the follow-up appointment.  Your physician has requested that you have a carotid duplex. This test is an ultrasound of the carotid arteries in your neck. It looks at blood flow through these arteries that supply the brain with blood. Allow one hour for this exam. There are no restrictions or special instructions.    

## 2011-08-08 NOTE — Progress Notes (Signed)
   HPI Patient is doing very well. He has known coronary disease. He had some chest discomfort in January, 2013.I have reviewed all the hospital records completely concerning this. There was no MI. Two-dimensional echo showed normal LV function. Nuclear stress study showed no scar or ischemia. He has not had any problems since. He does follow full activities.  No Known Allergies  Current Outpatient Prescriptions  Medication Sig Dispense Refill  . aspirin 81 MG EC tablet Take 81 mg by mouth daily.        Marland Kitchen atorvastatin (LIPITOR) 40 MG tablet Take 40 mg by mouth daily.      . clopidogrel (PLAVIX) 75 MG tablet Take 75 mg by mouth daily.      . metoprolol succinate (TOPROL-XL) 25 MG 24 hr tablet Take 25 mg by mouth daily.      . niacin (NIASPAN) 1000 MG CR tablet Take 1,000 mg by mouth at bedtime.      . nitroGLYCERIN (NITROSTAT) 0.4 MG SL tablet Place 0.4 mg under the tongue every 5 (five) minutes as needed. May repeat up to 3 doses.       . pantoprazole (PROTONIX) 40 MG tablet Take 1 tablet (40 mg total) by mouth daily.  90 tablet  0  . ramipril (ALTACE) 2.5 MG capsule Take 2.5 mg by mouth daily.        History   Social History  . Marital Status: Married    Spouse Name: N/A    Number of Children: N/A  . Years of Education: N/A   Occupational History  . Not on file.   Social History Main Topics  . Smoking status: Former Games developer  . Smokeless tobacco: Not on file  . Alcohol Use: Yes     rare  . Drug Use: No  . Sexually Active:    Other Topics Concern  . Not on file   Social History Narrative  . No narrative on file    No family history on file.  Past Medical History  Diagnosis Date  . Overweight   . Dyslipidemia   . CAD (coronary artery disease)     [name] (DES) stent 2006; Nuclear stress 05/08, EF 62%, no ischemia  . Ejection fraction     50% catheterization 2006 /  60% nuclear, 2008  . MI (myocardial infarction)   . Kidney stones   . Headache     Past Surgical  History  Procedure Date  . Cardiac catheterization 2006    EF 50%; 62% nuclear 2008  . Kidney stone surgery   . Ankle surgery   . Coronary angioplasty     ROS   Patient denies fever, chills, headache, sweats, rash, change in vision, change in hearing, chest pain, cough, nausea vomiting, urinary symptoms. All other systems are reviewed and are negative.  PHYSICAL EXAM  Patient is overweight but stable. He is oriented to person time and place. Affect is normal. There is no jugular venous distention. There may be a soft left carotid bruit. Lungs are clear. Respiratory effort is nonlabored. Cardiac exam reveals S1 and S2. There no clicks or significant murmurs. The abdomen is soft. Is no peripheral edema. There no musculoskeletal deformities. There are no skin rashes. Filed Vitals:   08/08/11 1546  BP: 125/69  Pulse: 78  Height: 6\' 2"  (1.88 m)  Weight: 266 lb (120.657 kg)     ASSESSMENT & PLAN

## 2011-08-08 NOTE — Assessment & Plan Note (Signed)
Coronary disease is stable. As outlined, he had a hospitalization in January. I reviewed the records and he had no ischemia or proven infarct. He does not need any further testing at this time.

## 2011-08-09 ENCOUNTER — Other Ambulatory Visit: Payer: Self-pay | Admitting: *Deleted

## 2011-08-09 MED ORDER — NIACIN ER (ANTIHYPERLIPIDEMIC) 1000 MG PO TBCR: 1000.0000 mg | EXTENDED_RELEASE_TABLET | Freq: Every day | ORAL | Status: DC

## 2011-08-09 MED ORDER — METOPROLOL SUCCINATE ER 25 MG PO TB24: 25.0000 mg | ORAL_TABLET | Freq: Every day | ORAL | Status: DC

## 2011-08-09 MED ORDER — ATORVASTATIN CALCIUM 40 MG PO TABS: 40.0000 mg | ORAL_TABLET | Freq: Every day | ORAL | Status: DC

## 2011-08-09 MED ORDER — RAMIPRIL 2.5 MG PO CAPS: 2.5000 mg | ORAL_CAPSULE | Freq: Every day | ORAL | Status: DC

## 2011-08-09 MED ORDER — CLOPIDOGREL BISULFATE 75 MG PO TABS: 75.0000 mg | ORAL_TABLET | Freq: Every day | ORAL | Status: AC

## 2011-08-28 ENCOUNTER — Encounter (INDEPENDENT_AMBULATORY_CARE_PROVIDER_SITE_OTHER): Payer: BC Managed Care – PPO

## 2011-08-28 DIAGNOSIS — R0989 Other specified symptoms and signs involving the circulatory and respiratory systems: Secondary | ICD-10-CM

## 2011-08-28 DIAGNOSIS — I6529 Occlusion and stenosis of unspecified carotid artery: Secondary | ICD-10-CM

## 2011-09-04 ENCOUNTER — Encounter: Payer: Self-pay | Admitting: Cardiology

## 2011-09-04 DIAGNOSIS — I739 Peripheral vascular disease, unspecified: Secondary | ICD-10-CM

## 2011-09-07 ENCOUNTER — Telehealth: Payer: Self-pay | Admitting: Cardiology

## 2011-09-07 NOTE — Telephone Encounter (Signed)
Patient called was told carotid dopplers revealed mild abnormality,Dr.Katz will follow.

## 2011-09-07 NOTE — Telephone Encounter (Signed)
Pt rtn call re results 

## 2011-09-07 NOTE — Progress Notes (Signed)
Called msg to call back for results.

## 2011-09-19 ENCOUNTER — Other Ambulatory Visit: Payer: Self-pay | Admitting: Cardiology

## 2011-09-19 MED ORDER — PANTOPRAZOLE SODIUM 40 MG PO TBEC: 40.0000 mg | DELAYED_RELEASE_TABLET | Freq: Every day | ORAL | Status: DC

## 2012-03-10 ENCOUNTER — Telehealth: Payer: Self-pay | Admitting: Cardiology

## 2012-03-10 NOTE — Telephone Encounter (Signed)
Form was re-faxed

## 2012-03-10 NOTE — Telephone Encounter (Signed)
New problem;   Fax was sent over on 10/29. To stop plavix  On Saturday . Upcoming procedure on  12/5.

## 2012-06-13 ENCOUNTER — Other Ambulatory Visit: Payer: Self-pay

## 2012-06-17 MED ORDER — PANTOPRAZOLE SODIUM 40 MG PO TBEC: 40.0000 mg | DELAYED_RELEASE_TABLET | Freq: Every day | ORAL | Status: DC

## 2012-09-12 ENCOUNTER — Ambulatory Visit (INDEPENDENT_AMBULATORY_CARE_PROVIDER_SITE_OTHER): Payer: BC Managed Care – PPO | Admitting: Cardiology

## 2012-09-12 ENCOUNTER — Encounter: Payer: Self-pay | Admitting: Cardiology

## 2012-09-12 VITALS — BP 132/68 | HR 57 | Ht 74.0 in | Wt 256.0 lb

## 2012-09-12 DIAGNOSIS — K439 Ventral hernia without obstruction or gangrene: Secondary | ICD-10-CM

## 2012-09-12 DIAGNOSIS — E785 Hyperlipidemia, unspecified: Secondary | ICD-10-CM

## 2012-09-12 DIAGNOSIS — I779 Disorder of arteries and arterioles, unspecified: Secondary | ICD-10-CM

## 2012-09-12 DIAGNOSIS — I2510 Atherosclerotic heart disease of native coronary artery without angina pectoris: Secondary | ICD-10-CM

## 2012-09-12 LAB — LIPID PANEL
LDL Cholesterol: 56 mg/dL (ref 0–99)
Total CHOL/HDL Ratio: 2

## 2012-09-12 NOTE — Patient Instructions (Addendum)
**Note De-identified Mckensie Scotti Obfuscation** Your physician recommends that you continue on your current medications as directed. Please refer to the Current Medication list given to you today.  Your physician recommends that you return for lab work in: today  Your physician wants you to follow-up in: 1 year. You will receive a reminder letter in the mail two months in advance. If you don't receive a letter, please call our office to schedule the follow-up appointment.    

## 2012-09-12 NOTE — Assessment & Plan Note (Signed)
We did a carotid Doppler for the first time last year. He has mild disease.

## 2012-09-12 NOTE — Progress Notes (Signed)
HPI   Patient is here for her yearly followup. He is quite stable. His original symptom was tightness across the chest and under both arms. He's had no recurring symptoms. He was in the hospital in January, 2013. Nuclear scan at that time showed no ischemia. His LV function is normal. He's going about full activities. While doing some sit-ups he noted a bulging in his midabdomen. He does have a ventral hernia.  No Known Allergies  Current Outpatient Prescriptions  Medication Sig Dispense Refill  . aspirin 81 MG EC tablet Take 81 mg by mouth daily.        Marland Kitchen atorvastatin (LIPITOR) 40 MG tablet Take 1 tablet (40 mg total) by mouth daily.  90 tablet  3  . metoprolol succinate (TOPROL-XL) 25 MG 24 hr tablet Take 1 tablet (25 mg total) by mouth daily.  90 tablet  3  . niacin (NIASPAN) 1000 MG CR tablet Take 1 tablet (1,000 mg total) by mouth at bedtime.  90 tablet  3  . nitroGLYCERIN (NITROSTAT) 0.4 MG SL tablet Place 1 tablet (0.4 mg total) under the tongue every 5 (five) minutes as needed. May repeat up to 3 doses.  25 tablet  11  . pantoprazole (PROTONIX) 40 MG tablet Take 1 tablet (40 mg total) by mouth daily.  90 tablet  1  . ramipril (ALTACE) 2.5 MG capsule Take 1 capsule (2.5 mg total) by mouth daily.  90 capsule  3   No current facility-administered medications for this visit.    History   Social History  . Marital Status: Married    Spouse Name: N/A    Number of Children: N/A  . Years of Education: N/A   Occupational History  . Not on file.   Social History Main Topics  . Smoking status: Former Games developer  . Smokeless tobacco: Not on file  . Alcohol Use: Yes     Comment: rare  . Drug Use: No  . Sexually Active:    Other Topics Concern  . Not on file   Social History Narrative  . No narrative on file    No family history on file.  Past Medical History  Diagnosis Date  . Overweight(278.02)   . Dyslipidemia   . CAD (coronary artery disease)     [name] (DES) stent  2006; Nuclear stress 05/08, EF 62%, no ischemia  . Ejection fraction     50% catheterization 2006 /  60% nuclear, 2008  . MI (myocardial infarction)   . Kidney stones   . Headache(784.0)   . Carotid artery disease     Doppler, May, 2013, 0-39% bilateral    Past Surgical History  Procedure Laterality Date  . Cardiac catheterization  2006    EF 50%; 62% nuclear 2008  . Kidney stone surgery    . Ankle surgery    . Coronary angioplasty      Patient Active Problem List   Diagnosis Date Noted  . Carotid artery disease   . Obesity (BMI 30-39.9) 05/07/2011  . CAD (coronary artery disease)   . Ejection fraction   . DYSLIPIDEMIA 09/21/2008  . OVERWEIGHT 09/21/2008    ROS   Patient denies fever, chills, headache, sweats, rash, change in vision, change in hearing, chest pain, cough, nausea vomiting, urinary symptoms. All other systems are reviewed and are negative.  PHYSICAL EXAM  Patient is oriented to person time and place. Affect is normal. Lungs are clear. Respiratory effort is nonlabored. Cardiac exam her vitals S1  and S2. There no clicks or significant murmurs. His abdomen is soft. When he tenses his abdominal musculature he does have an abdominal ventral hernia. It is broad-based. There is no peripheral edema. There no musculoskeletal deformities. There are no skin rashes.  Filed Vitals:   09/12/12 0904  BP: 132/68  Pulse: 57  Height: 6\' 2"  (1.88 m)  Weight: 256 lb (116.121 kg)   EKG is done today and reviewed by me. There is mild sinus bradycardia. QRS is normal. There is no change from the past.  ASSESSMENT & PLAN

## 2012-09-12 NOTE — Assessment & Plan Note (Signed)
Lipids will be checked today. Over time he had a dramatic increase in his HDL from niacin. I explained to him that we are no longer starting niacin with new or patient's. However there is no evidence that this is not a good drug for him. He was impressed with the fact that he had excellent response with increase in his HDL. I am in agreement that continuing this medication will be good for him. He is also on guidelines directed Lipitor.

## 2012-09-12 NOTE — Assessment & Plan Note (Signed)
This is a new problem today. It is broad-based. There is no risk to him. I encouraged him to continue strengthening  his abdominal muscles, But not to overdo his exercising. If he has major changes over time this hernia may have to be assessed further

## 2012-09-12 NOTE — Assessment & Plan Note (Signed)
Coronary disease is stable. He received a drug-eluting stent in 2006. Most recent nuclear was January, 2013. There was no scar or ischemia. Ejection fraction 60%. He's having no symptoms. No further workup.

## 2012-09-24 ENCOUNTER — Other Ambulatory Visit: Payer: Self-pay

## 2012-09-24 MED ORDER — NIACIN ER (ANTIHYPERLIPIDEMIC) 1000 MG PO TBCR: 1000.0000 mg | EXTENDED_RELEASE_TABLET | Freq: Every day | ORAL | Status: DC

## 2012-09-24 NOTE — Telephone Encounter (Signed)
niacin (NIASPAN) 1000 MG CR tablet  Take 1 tablet (1,000 mg total) by mouth at bedtime.   90 tablet   3   DYSLIPIDEMIA - Luis Abed, MD at 09/12/2012  9:34 AM  Status: Written Related Problem: DYSLIPIDEMIA  Lipids will be checked today. Over time he had a dramatic increase in his HDL from niacin. I explained to him that we are no longer starting niacin with new or patient's. However there is no evidence that this is not a good drug for him. He was impressed with the fact that he had excellent response with increase in his HDL. I am in agreement that continuing this medication will be good for him. He is also on guidelines directed Lipitor.

## 2012-09-25 ENCOUNTER — Other Ambulatory Visit: Payer: Self-pay

## 2012-09-25 MED ORDER — ATORVASTATIN CALCIUM 40 MG PO TABS: 40.0000 mg | ORAL_TABLET | Freq: Every day | ORAL | Status: AC

## 2012-09-25 MED ORDER — METOPROLOL SUCCINATE ER 25 MG PO TB24: 25.0000 mg | ORAL_TABLET | Freq: Every day | ORAL | Status: AC

## 2012-09-25 NOTE — Telephone Encounter (Signed)
Patient Instructions  Your physician recommends that you continue on your current medications as directed. Please refer to the Current Medication list given to you today. Luis Abed, MD at 09/12/2012  1:27 PM

## 2012-10-02 ENCOUNTER — Other Ambulatory Visit: Payer: Self-pay | Admitting: Emergency Medicine

## 2012-10-02 NOTE — Telephone Encounter (Signed)
Refill request on rampril and plavix.   Patient was seen on 09/12/12, informing patient that medication was not listed during that visit.  Patient states Dr. Myrtis Ser is aware.  Please advise.

## 2012-10-08 MED ORDER — RAMIPRIL 2.5 MG PO CAPS: 2.5000 mg | ORAL_CAPSULE | Freq: Every day | ORAL | Status: DC

## 2013-02-19 ENCOUNTER — Other Ambulatory Visit: Payer: Self-pay

## 2013-03-27 ENCOUNTER — Telehealth: Payer: Self-pay | Admitting: Oncology

## 2013-03-27 NOTE — Telephone Encounter (Signed)
S/W PT AND GVE NP APPT 12/18 @ 10:30 W/DR. CHISM REFERRING -[facility] DX- PANCREATIC CA WELCOME PACKET MAILED.

## 2013-03-30 ENCOUNTER — Telehealth: Payer: Self-pay | Admitting: Oncology

## 2013-03-30 NOTE — Telephone Encounter (Signed)
C/D 03/30/13 for appt. 04/02/13

## 2013-04-01 ENCOUNTER — Other Ambulatory Visit: Payer: Self-pay | Admitting: Oncology

## 2013-04-01 DIAGNOSIS — C259 Malignant neoplasm of pancreas, unspecified: Secondary | ICD-10-CM

## 2013-04-02 ENCOUNTER — Ambulatory Visit (HOSPITAL_BASED_OUTPATIENT_CLINIC_OR_DEPARTMENT_OTHER): Payer: BC Managed Care – PPO | Admitting: Oncology

## 2013-04-02 ENCOUNTER — Other Ambulatory Visit (HOSPITAL_BASED_OUTPATIENT_CLINIC_OR_DEPARTMENT_OTHER): Payer: BC Managed Care – PPO

## 2013-04-02 ENCOUNTER — Ambulatory Visit (HOSPITAL_BASED_OUTPATIENT_CLINIC_OR_DEPARTMENT_OTHER): Payer: BC Managed Care – PPO

## 2013-04-02 ENCOUNTER — Encounter: Payer: Self-pay | Admitting: Oncology

## 2013-04-02 VITALS — BP 137/83 | HR 104 | Temp 98.0°F | Resp 19 | Ht 74.0 in | Wt 207.5 lb

## 2013-04-02 DIAGNOSIS — M549 Dorsalgia, unspecified: Secondary | ICD-10-CM

## 2013-04-02 DIAGNOSIS — R109 Unspecified abdominal pain: Secondary | ICD-10-CM

## 2013-04-02 DIAGNOSIS — C259 Malignant neoplasm of pancreas, unspecified: Secondary | ICD-10-CM

## 2013-04-02 DIAGNOSIS — C252 Malignant neoplasm of tail of pancreas: Secondary | ICD-10-CM

## 2013-04-02 LAB — COMPREHENSIVE METABOLIC PANEL (CC13)
AST: 21 U/L (ref 5–34)
Albumin: 3.8 g/dL (ref 3.5–5.0)
Anion Gap: 12 mEq/L — ABNORMAL HIGH (ref 3–11)
BUN: 9 mg/dL (ref 7.0–26.0)
CO2: 26 mEq/L (ref 22–29)
Calcium: 10.2 mg/dL (ref 8.4–10.4)
Chloride: 101 mEq/L (ref 98–109)
Potassium: 4.2 mEq/L (ref 3.5–5.1)

## 2013-04-02 LAB — CBC WITH DIFFERENTIAL/PLATELET
Basophils Absolute: 0 10*3/uL (ref 0.0–0.1)
EOS%: 0.5 % (ref 0.0–7.0)
Eosinophils Absolute: 0.1 10*3/uL (ref 0.0–0.5)
HCT: 48.2 % (ref 38.4–49.9)
HGB: 16 g/dL (ref 13.0–17.1)
MCH: 29.7 pg (ref 27.2–33.4)
MONO#: 1 10*3/uL — ABNORMAL HIGH (ref 0.1–0.9)
NEUT#: 9 10*3/uL — ABNORMAL HIGH (ref 1.5–6.5)
RDW: 13.3 % (ref 11.0–14.6)
WBC: 11.4 10*3/uL — ABNORMAL HIGH (ref 4.0–10.3)
lymph#: 1.3 10*3/uL (ref 0.9–3.3)

## 2013-04-02 MED ORDER — OXYCODONE HCL 10 MG PO TABS: 10.0000 mg | ORAL_TABLET | ORAL | Status: DC

## 2013-04-02 NOTE — Consult Note (Signed)
Reason for Referral: Pancreatic cancer.   HPI: 57 year old gentleman currently of [place] her he currently resides. He is a gentleman with past medical history of coronary artery disease but really no significant comorbid conditions otherwise. He was in his normal state of health and in the last few months started developing abdominal pain and back pain associated with weight loss. His workup initially consistent of an abdominal ultrasound but subsequently on 03/19/2013 underwent a CT scan of the abdomen and pelvis after an ultrasound showed a fullness of the collective system. His CT scan of the abdomen showed a 4.6 x 3.8 cm mass at the tail of the pancreas as well as a suspicious 1.2 cm lymph node is noted as well. Perihepatic ascites and diffuse omental caking were all detected suggesting advanced malignancy. On 03/24/2013 he underwent an EUS and fine-needle aspiration of his pancreatic tumor that was done by [facility] health gastroenterology showed adenocarcinoma confirming the presence of pancreatic cancer. Patient is referred to me for the evaluation and management options.  Clinically, Jimmy Christensen continue to have a low grade nagging mid abdominal and back pain. Is not associated with any nausea or vomiting. Has not reported any hematochezia or melena. Has not reported any jaundice or change in the color of his urine and stool. He is taking Percocet with some relief of his symptoms. He has reported close to 50 pound weight loss in the last 6 months. Some of that is intentional and most recently it has not. He does not report any headaches blurred vision or double vision. Did not report any seizure activity or neurological complaints. He does not report any chest pain shortness of breath or cough. Does that report any palpitation or leg edema. Does not report any wheezing or hemoptysis. Does not report any frequency urgency or hesitancy. Does not report any hematuria or dysuria.  Does not report any joint pain or arthralgias. Does not report any bleeding or clotting tendencies.   Past Medical History  Diagnosis Date  . Overweight(278.02)   . Dyslipidemia   . CAD (coronary artery disease)     [name] (DES) stent 2006; Nuclear stress 05/08, EF 62%, no ischemia  . Ejection fraction     50% catheterization 2006 /  60% nuclear, 2008  . MI (myocardial infarction)   . Kidney stones   . Headache(784.0)   . Carotid artery disease     Doppler, May, 2013, 0-39% bilateral  . Ventral hernia     First noted may, 2014  :  Past Surgical History  Procedure Laterality Date  . Cardiac catheterization  2006    EF 50%; 62% nuclear 2008  . Kidney stone surgery    . Ankle surgery    . Coronary angioplasty    :   Current Outpatient Prescriptions  Medication Sig Dispense Refill  . aspirin 81 MG EC tablet Take 81 mg by mouth daily.        Marland Kitchen atorvastatin (LIPITOR) 40 MG tablet Take 1 tablet (40 mg total) by mouth daily.  90 tablet  3  . Canagliflozin (INVOKANA) 100 MG TABS Take 1 tablet by mouth daily.      . ENDOCET 5-325 MG per tablet Take 1 tablet by mouth every 6 (six) hours as needed.      . fluticasone (FLONASE) 50 MCG/ACT nasal spray Place 1 spray into both nostrils daily.      . insulin glargine (LANTUS) 100 UNIT/ML injection Inject 10 Units into the skin  daily.      . metFORMIN (GLUCOPHAGE) 500 MG tablet Take 1 tablet by mouth daily.      . metoprolol succinate (TOPROL-XL) 25 MG 24 hr tablet Take 1 tablet (25 mg total) by mouth daily.  90 tablet  3  . niacin (NIASPAN) 1000 MG CR tablet Take 1 tablet (1,000 mg total) by mouth at bedtime.  90 tablet  3  . nitroGLYCERIN (NITROSTAT) 0.4 MG SL tablet Place 1 tablet (0.4 mg total) under the tongue every 5 (five) minutes as needed. May repeat up to 3 doses.  25 tablet  11  . pantoprazole (PROTONIX) 40 MG tablet Take 1 tablet (40 mg total) by mouth daily.  90 tablet  1  . Oxycodone HCl 10 MG TABS Take 1 tablet (10 mg total) by  mouth every 4 (four) hours.  50 tablet  0   No current facility-administered medications for this visit.      No Known Allergies:  No family history on file.:  History   Social History  . Marital Status: Married    Spouse Name: N/A    Number of Children: N/A  . Years of Education: N/A   Occupational History  . Not on file.   Social History Main Topics  . Smoking status: Former Games developer  . Smokeless tobacco: Not on file  . Alcohol Use: Yes     Comment: rare  . Drug Use: No  . Sexual Activity:    Other Topics Concern  . Not on file   Social History Narrative  . No narrative on file  :  Pertinent items are noted in HPI.  Exam: ECOG 1 Blood pressure 137/83, pulse 104, temperature 98 F (36.7 C), temperature source Oral, resp. rate 19, height 6\' 2"  (1.88 m), weight 207 lb 8 oz (94.121 kg). General appearance: alert, cooperative and appears stated age Head: Normocephalic, without obvious abnormality, atraumatic Throat: lips, mucosa, and tongue normal; teeth and gums normal Neck: no adenopathy, no carotid bruit, no JVD, supple, symmetrical, trachea midline and thyroid not enlarged, symmetric, no tenderness/mass/nodules Back: symmetric, no curvature. ROM normal. No CVA tenderness. Resp: clear to auscultation bilaterally Chest wall: no tenderness Cardio: regular rate and rhythm, S1, S2 normal, no murmur, click, rub or gallop GI: soft, non-tender; bowel sounds normal; no masses,  no organomegaly Extremities: extremities normal, atraumatic, no cyanosis or edema Pulses: 2+ and symmetric Skin: Skin color, texture, turgor normal. No rashes or lesions Lymph nodes: Cervical, supraclavicular, and axillary nodes normal. Neurologic: Grossly normal   Recent Labs  04/02/13 1040  WBC 11.4*  HGB 16.0  HCT 48.2  PLT 309    Recent Labs  04/02/13 1040  NA 139  K 4.2  CO2 26  GLUCOSE 157*  BUN 9.0  CREATININE 0.7  CALCIUM 10.2      Assessment and Plan:    57 year old gentleman with the following issues:  1. 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 He presents with a tail of the pancreas tumor measuring 4.6 x 3.8 cm. His abdominal imaging studies showed omental caking, ascites and possible lymphadenopathy. The natural course of this disease was discussed today in detail with Jimmy Christensen and his wife. I've explained to them that this is an incurable malignancy and carries a rather poor prognosis despite the best treatment options. Given his age and a reasonable performance status he would be a good candidate for palliative chemotherapy in attempt to palliate future symptoms from advanced pancreatic cancer as  well as improve overall survival modestly. I've explained to him as well that surgery has no role at this time and radiation is likely to benefit.  Options of systemic chemotherapy were discussed today that would include Gemzar alone, Gemzar with Tarceva, Gemzar with Abraxane and FOLFOXIRI. The risks and benefits of these regimens were discussed in details complications associated with him were discussed as well. The benefit is clearly increases with adding more drugs leaving combination of 5-FU, oxaliplatin and CPT-11 is the most effective regimen but carries the most toxicity. Complications include myelosuppression, peripheral neuropathy, GI complications, neutropenia, neutropenic sepsis and infusion-related complications are some of these complications discussed today. The benefits of all of these regimens are marginal at this point.  The plan for him is to think about this discussion today he is also seeking second opinion at another institutions and he will let me know if he was to be treated here and what specific regimen he is willing to go with.  2. Abdominal pain: I have given him prescription for oxycodone 10 mg I've also discussed with him the appropriate bowel regimen as well as role of note a block in the future if narcotic  analgesics do not help.  3. Gastric outlet obstruction as well as biliary obstruction: At this point I see no signs of the fact that his tumor is in detail the pancreas makes this less likely at this time.   4. Thrombosis prophylaxis: The risk of VTE is high with pancreatic malignancy and he is not showing any evidence of thrombosis at this time.

## 2013-04-02 NOTE — Progress Notes (Signed)
Please see consult note.  

## 2013-04-02 NOTE — Progress Notes (Signed)
Checked in new patient with no financial issues. He has appt card. °

## 2013-04-03 LAB — CANCER ANTIGEN 19-9: CA 19-9: 22381 U/mL — ABNORMAL HIGH (ref <35.0)

## 2013-05-02 ENCOUNTER — Emergency Department (HOSPITAL_BASED_OUTPATIENT_CLINIC_OR_DEPARTMENT_OTHER)
Admission: EM | Admit: 2013-05-02 | Discharge: 2013-05-02 | Disposition: A | Discharge status: Home / Self Care | Payer: BC Managed Care – PPO | Attending: Emergency Medicine | Admitting: Emergency Medicine

## 2013-05-02 ENCOUNTER — Encounter (HOSPITAL_BASED_OUTPATIENT_CLINIC_OR_DEPARTMENT_OTHER): Payer: Self-pay | Admitting: Emergency Medicine

## 2013-05-02 DIAGNOSIS — E663 Overweight: Secondary | ICD-10-CM | POA: Insufficient documentation

## 2013-05-02 DIAGNOSIS — Z87442 Personal history of urinary calculi: Secondary | ICD-10-CM | POA: Insufficient documentation

## 2013-05-02 DIAGNOSIS — Z79899 Other long term (current) drug therapy: Secondary | ICD-10-CM | POA: Insufficient documentation

## 2013-05-02 DIAGNOSIS — K625 Hemorrhage of anus and rectum: Secondary | ICD-10-CM | POA: Insufficient documentation

## 2013-05-02 DIAGNOSIS — Z9861 Coronary angioplasty status: Secondary | ICD-10-CM | POA: Insufficient documentation

## 2013-05-02 DIAGNOSIS — K644 Residual hemorrhoidal skin tags: Secondary | ICD-10-CM | POA: Insufficient documentation

## 2013-05-02 DIAGNOSIS — IMO0002 Reserved for concepts with insufficient information to code with codable children: Secondary | ICD-10-CM | POA: Insufficient documentation

## 2013-05-02 DIAGNOSIS — Z7982 Long term (current) use of aspirin: Secondary | ICD-10-CM | POA: Insufficient documentation

## 2013-05-02 DIAGNOSIS — E119 Type 2 diabetes mellitus without complications: Secondary | ICD-10-CM | POA: Insufficient documentation

## 2013-05-02 DIAGNOSIS — R109 Unspecified abdominal pain: Secondary | ICD-10-CM | POA: Insufficient documentation

## 2013-05-02 DIAGNOSIS — C259 Malignant neoplasm of pancreas, unspecified: Secondary | ICD-10-CM | POA: Insufficient documentation

## 2013-05-02 DIAGNOSIS — K649 Unspecified hemorrhoids: Secondary | ICD-10-CM

## 2013-05-02 DIAGNOSIS — E785 Hyperlipidemia, unspecified: Secondary | ICD-10-CM | POA: Insufficient documentation

## 2013-05-02 DIAGNOSIS — Z794 Long term (current) use of insulin: Secondary | ICD-10-CM | POA: Insufficient documentation

## 2013-05-02 DIAGNOSIS — I252 Old myocardial infarction: Secondary | ICD-10-CM | POA: Insufficient documentation

## 2013-05-02 DIAGNOSIS — I2510 Atherosclerotic heart disease of native coronary artery without angina pectoris: Secondary | ICD-10-CM | POA: Insufficient documentation

## 2013-05-02 DIAGNOSIS — Z87891 Personal history of nicotine dependence: Secondary | ICD-10-CM | POA: Insufficient documentation

## 2013-05-02 DIAGNOSIS — Z95818 Presence of other cardiac implants and grafts: Secondary | ICD-10-CM | POA: Insufficient documentation

## 2013-05-02 DIAGNOSIS — G8929 Other chronic pain: Secondary | ICD-10-CM | POA: Insufficient documentation

## 2013-05-02 HISTORY — DX: Constipation, unspecified: K59.00

## 2013-05-02 HISTORY — DX: Nausea: T45.1X5A

## 2013-05-02 HISTORY — DX: Pleural effusion, not elsewhere classified: J90

## 2013-05-02 HISTORY — DX: Malignant neoplasm of pancreas, unspecified: C25.9

## 2013-05-02 HISTORY — DX: Nausea: R11.0

## 2013-05-02 LAB — CBC
HCT: 35.1 % — ABNORMAL LOW (ref 39.0–52.0)
Hemoglobin: 11.7 g/dL — ABNORMAL LOW (ref 13.0–17.0)
MCH: 28.7 pg (ref 26.0–34.0)
MCHC: 33.3 g/dL (ref 30.0–36.0)
MCV: 86.2 fL (ref 78.0–100.0)
PLATELETS: 208 10*3/uL (ref 150–400)
RBC: 4.07 MIL/uL — ABNORMAL LOW (ref 4.22–5.81)
RDW: 14.1 % (ref 11.5–15.5)
WBC: 5.8 10*3/uL (ref 4.0–10.5)

## 2013-05-02 MED ORDER — SENNOSIDES-DOCUSATE SODIUM 8.6-50 MG PO TABS
2.0000 | ORAL_TABLET | Freq: Every day | ORAL | Status: DC
Start: 2013-05-02 — End: 2013-05-08

## 2013-05-02 MED ORDER — MAGNESIUM CITRATE PO SOLN
1.0000 | Freq: Once | ORAL | Status: DC
Start: 2013-05-02 — End: 2013-05-04

## 2013-05-02 MED ORDER — FLEET ENEMA 7-19 GM/118ML RE ENEM
1.0000 | ENEMA | Freq: Once | RECTAL | Status: AC
  Administered 2013-05-02: 1 via RECTAL
  Filled 2013-05-02: qty 1

## 2013-05-02 MED ORDER — BELLADONNA-OPIUM 16.2-30 MG RE SUPP: 30.0000 mg | Freq: Three times a day (TID) | RECTAL | Status: DC | PRN

## 2013-05-02 NOTE — Discharge Instructions (Signed)
As discussed, it is important that you follow-up with your physicians to ensure appropriate ongoing care.  Return here for concerning changes in your condition.

## 2013-05-02 NOTE — ED Notes (Signed)
Pt having rectal bleeding with some constipation since last night.  Pt relates it is dark red bleeding when he wipes.  Pt states he is having some bulging sensation with the urge to have BM.  Poor appetite.  Having some nausea due to treatment of pancreatic cancer.

## 2013-05-02 NOTE — ED Provider Notes (Signed)
CSN: [id number]     Arrival date & time 05/02/13  9381 History   First MD Initiated Contact with Patient 05/02/13 1022     Chief Complaint  Patient presents with  . Rectal Bleeding   (Consider location/radiation/quality/duration/timing/severity/associated sxs/prior Treatment) HPI Patient presents with concerns rectal bleeding and pain. Patient notes that symptoms have been present for several days. He notes that the bleeding is apparently new, present with defecation.  There is black stool, red blood. There is associated mild lower abdominal pain. Patient has a notable history of chronic pain for which he takes significant necrotic. In addition the patient was recently diagnosed with pancreatic cancer, and is currently receiving chemotherapy. Of chemotherapy session completed 2 days ago. Patient also had a consultation of collapsed lung. He has a drain in the right lung.  One he notes that this is a unchanged, constant, no current complaints. For the patient's rectal pain, he has not taken any medication.  Past Medical History  Diagnosis Date  . Overweight   . Dyslipidemia   . CAD (coronary artery disease)     [name] (DES) stent 2006; Nuclear stress 05/08, EF 62%, no ischemia  . Ejection fraction     50% catheterization 2006 /  60% nuclear, 2008  . MI (myocardial infarction)   . Kidney stones   . Headache(784.0)   . Carotid artery disease     Doppler, May, 2013, 0-39% bilateral  . Ventral hernia     First noted may, 2014  . Pancreatic cancer   . Constipation   . Chemotherapy-induced nausea   . Pleural effusion   . Pleural effusion on right    Past Surgical History  Procedure Laterality Date  . Cardiac catheterization  2006    EF 50%; 62% nuclear 2008  . Kidney stone surgery    . Ankle surgery    . Coronary angioplasty    . Insertion / placement pleural catheter     History reviewed. No pertinent family history. History  Substance Use Topics  . Smoking status:  Former Research scientist (life sciences)  . Smokeless tobacco: Not on file  . Alcohol Use: Yes     Comment: rare    Review of Systems  Constitutional:       Per HPI, otherwise negative  HENT:       Per HPI, otherwise negative  Respiratory:       Per HPI, otherwise negative  Cardiovascular:       Per HPI, otherwise negative  Gastrointestinal: Negative for vomiting.  Endocrine:       Negative aside from HPI  Genitourinary:       Neg aside from HPI   Musculoskeletal:       Per HPI, otherwise negative  Skin: Negative.   Neurological: Negative for syncope.    Allergies  Review of patient's allergies indicates no known allergies.  Home Medications   Current Outpatient Rx  Name  Route  Sig  Dispense  Refill  . aspirin 81 MG EC tablet   Oral   Take 81 mg by mouth daily.           Marland Kitchen atorvastatin (LIPITOR) 40 MG tablet   Oral   Take 1 tablet (40 mg total) by mouth daily.   90 tablet   3   . Canagliflozin (INVOKANA) 100 MG TABS   Oral   Take 1 tablet by mouth daily.         . ENDOCET 5-325 MG per tablet   Oral  Take 1 tablet by mouth every 6 (six) hours as needed.         . fluticasone (FLONASE) 50 MCG/ACT nasal spray   Each Nare   Place 1 spray into both nostrils daily.         . insulin glargine (LANTUS) 100 UNIT/ML injection   Subcutaneous   Inject 10 Units into the skin daily.         . metFORMIN (GLUCOPHAGE) 500 MG tablet   Oral   Take 1 tablet by mouth daily.         . metoprolol succinate (TOPROL-XL) 25 MG 24 hr tablet   Oral   Take 1 tablet (25 mg total) by mouth daily.   90 tablet   3   . niacin (NIASPAN) 1000 MG CR tablet   Oral   Take 1 tablet (1,000 mg total) by mouth at bedtime.   90 tablet   3   . nitroGLYCERIN (NITROSTAT) 0.4 MG SL tablet   Sublingual   Place 1 tablet (0.4 mg total) under the tongue every 5 (five) minutes as needed. May repeat up to 3 doses.   25 tablet   11   . Oxycodone HCl 10 MG TABS   Oral   Take 1 tablet (10 mg total) by  mouth every 4 (four) hours.   50 tablet   0   . pantoprazole (PROTONIX) 40 MG tablet   Oral   Take 1 tablet (40 mg total) by mouth daily.   90 tablet   1    BP 108/70  Pulse 124  Temp(Src) 97.7 F (36.5 C) (Oral)  Resp 22  Ht 6\' 2"  (1.88 m)  Wt 188 lb (85.276 kg)  BMI 24.13 kg/m2  SpO2 94% Physical Exam  Nursing note and vitals reviewed. Constitutional: He is oriented to person, place, and time. He appears well-developed. He has a sickly appearance. No distress.  HENT:  Head: Normocephalic and atraumatic.  Eyes: Conjunctivae and EOM are normal.  Cardiovascular: Normal rate and regular rhythm.   Pulmonary/Chest: Effort normal. No stridor. No respiratory distress.    Abdominal: Soft. He exhibits no distension. There is tenderness in the suprapubic area.  Genitourinary:     Musculoskeletal: He exhibits no edema.  Neurological: He is alert and oriented to person, place, and time.  Skin: Skin is warm and dry.  Psychiatric: He has a normal mood and affect.    ED Course  Procedures (including critical care time) Labs Review Labs Reviewed  CBC   Imaging Review No results found.  EKG Interpretation   None      1:20 PM Patient had BM following enema.  No new complaints MDM   1. Rectal bleeding   2. Hemorrhoid    this patient with history of pancreatic cancer now presents with rectal bleeding.  On exam is awake alert mildly tachycardic, but otherwise hemodynamically stable.  Patient has a notable external hemorrhoids, nonthrombosed.  After this had some relief with medication here, he was discharged.  Panel a conversation about hemorrhoids, primary care followup, eventual surgical evaluation if his hemorrhoids persist.     Carmin Muskrat, MD 05/02/13 1322

## 2013-05-04 ENCOUNTER — Encounter (HOSPITAL_COMMUNITY): Payer: Self-pay | Admitting: Emergency Medicine

## 2013-05-04 ENCOUNTER — Inpatient Hospital Stay (HOSPITAL_COMMUNITY)
Admission: EM | Admit: 2013-05-04 | Discharge: 2013-05-08 | DRG: 871 | Disposition: A | Discharge status: Home Health | Payer: BC Managed Care – PPO | Attending: Internal Medicine | Admitting: Internal Medicine

## 2013-05-04 ENCOUNTER — Emergency Department (HOSPITAL_COMMUNITY): Payer: BC Managed Care – PPO

## 2013-05-04 DIAGNOSIS — IMO0002 Reserved for concepts with insufficient information to code with codable children: Secondary | ICD-10-CM

## 2013-05-04 DIAGNOSIS — J90 Pleural effusion, not elsewhere classified: Secondary | ICD-10-CM

## 2013-05-04 DIAGNOSIS — I252 Old myocardial infarction: Secondary | ICD-10-CM

## 2013-05-04 DIAGNOSIS — E872 Acidosis, unspecified: Secondary | ICD-10-CM

## 2013-05-04 DIAGNOSIS — I739 Peripheral vascular disease, unspecified: Secondary | ICD-10-CM

## 2013-05-04 DIAGNOSIS — C259 Malignant neoplasm of pancreas, unspecified: Secondary | ICD-10-CM | POA: Diagnosis present

## 2013-05-04 DIAGNOSIS — Z800 Family history of malignant neoplasm of digestive organs: Secondary | ICD-10-CM

## 2013-05-04 DIAGNOSIS — E785 Hyperlipidemia, unspecified: Secondary | ICD-10-CM

## 2013-05-04 DIAGNOSIS — E669 Obesity, unspecified: Secondary | ICD-10-CM

## 2013-05-04 DIAGNOSIS — E43 Unspecified severe protein-calorie malnutrition: Secondary | ICD-10-CM

## 2013-05-04 DIAGNOSIS — R1084 Generalized abdominal pain: Secondary | ICD-10-CM | POA: Diagnosis present

## 2013-05-04 DIAGNOSIS — K439 Ventral hernia without obstruction or gangrene: Secondary | ICD-10-CM

## 2013-05-04 DIAGNOSIS — R0609 Other forms of dyspnea: Secondary | ICD-10-CM | POA: Diagnosis present

## 2013-05-04 DIAGNOSIS — Z515 Encounter for palliative care: Secondary | ICD-10-CM

## 2013-05-04 DIAGNOSIS — I2510 Atherosclerotic heart disease of native coronary artery without angina pectoris: Secondary | ICD-10-CM

## 2013-05-04 DIAGNOSIS — R112 Nausea with vomiting, unspecified: Secondary | ICD-10-CM | POA: Diagnosis present

## 2013-05-04 DIAGNOSIS — J189 Pneumonia, unspecified organism: Secondary | ICD-10-CM

## 2013-05-04 DIAGNOSIS — Z87891 Personal history of nicotine dependence: Secondary | ICD-10-CM

## 2013-05-04 DIAGNOSIS — E8729 Other acidosis: Secondary | ICD-10-CM | POA: Diagnosis present

## 2013-05-04 DIAGNOSIS — C252 Malignant neoplasm of tail of pancreas: Secondary | ICD-10-CM | POA: Diagnosis present

## 2013-05-04 DIAGNOSIS — Z7982 Long term (current) use of aspirin: Secondary | ICD-10-CM

## 2013-05-04 DIAGNOSIS — A419 Sepsis, unspecified organism: Principal | ICD-10-CM

## 2013-05-04 DIAGNOSIS — Z87442 Personal history of urinary calculi: Secondary | ICD-10-CM

## 2013-05-04 DIAGNOSIS — C801 Malignant (primary) neoplasm, unspecified: Secondary | ICD-10-CM

## 2013-05-04 DIAGNOSIS — M25512 Pain in left shoulder: Secondary | ICD-10-CM

## 2013-05-04 DIAGNOSIS — R9430 Abnormal result of cardiovascular function study, unspecified: Secondary | ICD-10-CM

## 2013-05-04 DIAGNOSIS — J9383 Other pneumothorax: Secondary | ICD-10-CM | POA: Diagnosis present

## 2013-05-04 DIAGNOSIS — R109 Unspecified abdominal pain: Secondary | ICD-10-CM

## 2013-05-04 DIAGNOSIS — C786 Secondary malignant neoplasm of retroperitoneum and peritoneum: Secondary | ICD-10-CM

## 2013-05-04 DIAGNOSIS — R0989 Other specified symptoms and signs involving the circulatory and respiratory systems: Secondary | ICD-10-CM | POA: Diagnosis present

## 2013-05-04 DIAGNOSIS — E871 Hypo-osmolality and hyponatremia: Secondary | ICD-10-CM

## 2013-05-04 DIAGNOSIS — R197 Diarrhea, unspecified: Secondary | ICD-10-CM

## 2013-05-04 DIAGNOSIS — I779 Disorder of arteries and arterioles, unspecified: Secondary | ICD-10-CM

## 2013-05-04 DIAGNOSIS — E663 Overweight: Secondary | ICD-10-CM

## 2013-05-04 DIAGNOSIS — R509 Fever, unspecified: Secondary | ICD-10-CM

## 2013-05-04 DIAGNOSIS — G5790 Unspecified mononeuropathy of unspecified lower limb: Secondary | ICD-10-CM | POA: Diagnosis present

## 2013-05-04 DIAGNOSIS — J9819 Other pulmonary collapse: Secondary | ICD-10-CM | POA: Diagnosis present

## 2013-05-04 DIAGNOSIS — E119 Type 2 diabetes mellitus without complications: Secondary | ICD-10-CM

## 2013-05-04 DIAGNOSIS — R188 Other ascites: Secondary | ICD-10-CM

## 2013-05-04 DIAGNOSIS — Z8249 Family history of ischemic heart disease and other diseases of the circulatory system: Secondary | ICD-10-CM

## 2013-05-04 DIAGNOSIS — J939 Pneumothorax, unspecified: Secondary | ICD-10-CM

## 2013-05-04 DIAGNOSIS — J910 Malignant pleural effusion: Secondary | ICD-10-CM | POA: Diagnosis present

## 2013-05-04 HISTORY — DX: Type 2 diabetes mellitus without complications: E11.9

## 2013-05-04 HISTORY — DX: Unspecified cataract: H26.9

## 2013-05-04 LAB — MRSA PCR SCREENING: MRSA by PCR: NEGATIVE

## 2013-05-04 LAB — URINALYSIS, ROUTINE W REFLEX MICROSCOPIC
Glucose, UA: >1000 mg/dL — AB
Hgb urine dipstick: NEGATIVE
Ketones, ur: 40 mg/dL — AB
Leukocytes, UA: NEGATIVE
NITRITE: NEGATIVE
PH: 6 (ref 5.0–8.0)
Protein, ur: 30 mg/dL — AB
Urobilinogen, UA: 0.2 mg/dL (ref 0.0–1.0)

## 2013-05-04 LAB — BASIC METABOLIC PANEL
BUN: 16 mg/dL (ref 6–23)
CALCIUM: 8.3 mg/dL — AB (ref 8.4–10.5)
CHLORIDE: 90 meq/L — AB (ref 96–112)
CO2: 25 mEq/L (ref 19–32)
CREATININE: 0.65 mg/dL (ref 0.50–1.35)
GFR calc non Af Amer: >90 mL/min (ref >90)
Glucose, Bld: 155 mg/dL — ABNORMAL HIGH (ref 70–99)
Potassium: 4.4 mEq/L (ref 3.7–5.3)
Sodium: 131 mEq/L — ABNORMAL LOW (ref 137–147)

## 2013-05-04 LAB — COMPREHENSIVE METABOLIC PANEL
ALT: 24 U/L (ref 0–53)
AST: 28 U/L (ref 0–37)
Albumin: 2 g/dL — ABNORMAL LOW (ref 3.5–5.2)
Alkaline Phosphatase: 75 U/L (ref 39–117)
BUN: 17 mg/dL (ref 6–23)
CALCIUM: 8.4 mg/dL (ref 8.4–10.5)
CHLORIDE: 93 meq/L — AB (ref 96–112)
CO2: 17 mEq/L — ABNORMAL LOW (ref 19–32)
Creatinine, Ser: 0.61 mg/dL (ref 0.50–1.35)
GFR calc Af Amer: >90 mL/min (ref >90)
GFR calc non Af Amer: >90 mL/min (ref >90)
Glucose, Bld: 157 mg/dL — ABNORMAL HIGH (ref 70–99)
Potassium: 4.2 mEq/L (ref 3.7–5.3)
Sodium: 132 mEq/L — ABNORMAL LOW (ref 137–147)
Total Bilirubin: 0.6 mg/dL (ref 0.3–1.2)
Total Protein: 5.2 g/dL — ABNORMAL LOW (ref 6.0–8.3)

## 2013-05-04 LAB — CBC WITH DIFFERENTIAL/PLATELET
BASOS PCT: 0 % (ref 0–1)
Basophils Absolute: 0 10*3/uL (ref 0.0–0.1)
EOS ABS: 0 10*3/uL (ref 0.0–0.7)
EOS PCT: 0 % (ref 0–5)
HCT: 44.7 % (ref 39.0–52.0)
HEMOGLOBIN: 15.5 g/dL (ref 13.0–17.0)
LYMPHS PCT: 11 % — AB (ref 12–46)
Lymphs Abs: 0.4 10*3/uL — ABNORMAL LOW (ref 0.7–4.0)
MCH: 29.2 pg (ref 26.0–34.0)
MCHC: 34.7 g/dL (ref 30.0–36.0)
MCV: 84.2 fL (ref 78.0–100.0)
MONO ABS: 0.1 10*3/uL (ref 0.1–1.0)
Monocytes Relative: 2 % — ABNORMAL LOW (ref 3–12)
NEUTROS PCT: 87 % — AB (ref 43–77)
Neutro Abs: 3.5 10*3/uL (ref 1.7–7.7)
PLATELETS: 216 10*3/uL (ref 150–400)
RBC: 5.31 MIL/uL (ref 4.22–5.81)
RDW: 14 % (ref 11.5–15.5)
WBC: 4 10*3/uL (ref 4.0–10.5)

## 2013-05-04 LAB — TROPONIN I

## 2013-05-04 LAB — GLUCOSE, CAPILLARY
GLUCOSE-CAPILLARY: 145 mg/dL — AB (ref 70–99)
GLUCOSE-CAPILLARY: 121 mg/dL — AB (ref 70–99)
Glucose-Capillary: 152 mg/dL — ABNORMAL HIGH (ref 70–99)

## 2013-05-04 LAB — CG4 I-STAT (LACTIC ACID): LACTIC ACID, VENOUS: 2.93 mmol/L — AB (ref 0.5–2.2)

## 2013-05-04 LAB — BODY FLUID CELL COUNT WITH DIFFERENTIAL
Eos, Fluid: 2 %
Lymphs, Fluid: 89 %
MONOCYTE-MACROPHAGE-SEROUS FLUID: 2 % — AB (ref 50–90)
Neutrophil Count, Fluid: 7 % (ref 0–25)
Total Nucleated Cell Count, Fluid: 584 cu mm (ref 0–1000)

## 2013-05-04 LAB — LIPASE, BLOOD: Lipase: 8 U/L — ABNORMAL LOW (ref 11–59)

## 2013-05-04 LAB — URINE MICROSCOPIC-ADD ON

## 2013-05-04 LAB — GRAM STAIN

## 2013-05-04 LAB — PRO B NATRIURETIC PEPTIDE: Pro B Natriuretic peptide (BNP): 542 pg/mL — ABNORMAL HIGH (ref 0–125)

## 2013-05-04 MED ORDER — VANCOMYCIN HCL 10 G IV SOLR
2000.0000 mg | Freq: Once | INTRAVENOUS | Status: AC
  Administered 2013-05-04: 2000 mg via INTRAVENOUS
  Filled 2013-05-04: qty 2000

## 2013-05-04 MED ORDER — SODIUM CHLORIDE 0.9 % IV SOLN
INTRAVENOUS | Status: AC
  Administered 2013-05-04: 16:00:00 via INTRAVENOUS

## 2013-05-04 MED ORDER — FLUTICASONE PROPIONATE 50 MCG/ACT NA SUSP
1.0000 | Freq: Every day | NASAL | Status: DC | PRN
  Filled 2013-05-04: qty 16

## 2013-05-04 MED ORDER — INSULIN ASPART 100 UNIT/ML ~~LOC~~ SOLN: 0.0000 [IU] | Freq: Every day | SUBCUTANEOUS | Status: DC

## 2013-05-04 MED ORDER — MORPHINE SULFATE ER 30 MG PO TBCR
30.0000 mg | EXTENDED_RELEASE_TABLET | Freq: Two times a day (BID) | ORAL | Status: DC
  Administered 2013-05-04: 30 mg via ORAL
  Filled 2013-05-04: qty 1

## 2013-05-04 MED ORDER — METOPROLOL SUCCINATE ER 25 MG PO TB24
25.0000 mg | ORAL_TABLET | Freq: Every day | ORAL | Status: DC
  Administered 2013-05-04 – 2013-05-07 (×3): 25 mg via ORAL
  Filled 2013-05-04 (×5): qty 1

## 2013-05-04 MED ORDER — DULOXETINE HCL 60 MG PO CPEP
60.0000 mg | ORAL_CAPSULE | Freq: Every day | ORAL | Status: DC
  Administered 2013-05-04 – 2013-05-08 (×5): 60 mg via ORAL
  Filled 2013-05-04 (×5): qty 1

## 2013-05-04 MED ORDER — SODIUM CHLORIDE 0.9 % IJ SOLN
3.0000 mL | Freq: Two times a day (BID) | INTRAMUSCULAR | Status: DC
Start: 2013-05-04 — End: 2013-05-08
  Administered 2013-05-04: 3 mL via INTRAVENOUS

## 2013-05-04 MED ORDER — INSULIN ASPART 100 UNIT/ML ~~LOC~~ SOLN
0.0000 [IU] | Freq: Three times a day (TID) | SUBCUTANEOUS | Status: DC
  Administered 2013-05-05: 1 [IU] via SUBCUTANEOUS
  Administered 2013-05-06 (×2): 2 [IU] via SUBCUTANEOUS
  Administered 2013-05-06 – 2013-05-08 (×2): 1 [IU] via SUBCUTANEOUS

## 2013-05-04 MED ORDER — ONDANSETRON HCL 4 MG/2ML IJ SOLN
4.0000 mg | Freq: Once | INTRAMUSCULAR | Status: AC
  Administered 2013-05-04: 4 mg via INTRAVENOUS
  Filled 2013-05-04: qty 2

## 2013-05-04 MED ORDER — ATORVASTATIN CALCIUM 40 MG PO TABS
40.0000 mg | ORAL_TABLET | Freq: Every day | ORAL | Status: DC
  Administered 2013-05-04 – 2013-05-08 (×5): 40 mg via ORAL
  Filled 2013-05-04 (×5): qty 1

## 2013-05-04 MED ORDER — ASPIRIN 81 MG PO CHEW
81.0000 mg | CHEWABLE_TABLET | Freq: Every day | ORAL | Status: DC
  Administered 2013-05-04 – 2013-05-08 (×5): 81 mg via ORAL
  Filled 2013-05-04 (×5): qty 1

## 2013-05-04 MED ORDER — HYDROMORPHONE HCL 2 MG PO TABS
2.0000 mg | ORAL_TABLET | ORAL | Status: DC | PRN
  Administered 2013-05-04 – 2013-05-06 (×2): 2 mg via ORAL
  Administered 2013-05-07: 4 mg via ORAL
  Filled 2013-05-04 (×2): qty 1
  Filled 2013-05-04: qty 2

## 2013-05-04 MED ORDER — PIPERACILLIN-TAZOBACTAM 3.375 G IVPB 30 MIN
3.3750 g | Freq: Once | INTRAVENOUS | Status: AC
  Administered 2013-05-04: 3.375 g via INTRAVENOUS
  Filled 2013-05-04: qty 50

## 2013-05-04 MED ORDER — NITROGLYCERIN 0.4 MG SL SUBL: 0.4000 mg | SUBLINGUAL_TABLET | SUBLINGUAL | Status: DC | PRN

## 2013-05-04 MED ORDER — HYDROCORTISONE 2.5 % RE CREA
TOPICAL_CREAM | Freq: Two times a day (BID) | RECTAL | Status: DC | PRN
  Filled 2013-05-04: qty 28.35

## 2013-05-04 MED ORDER — BISACODYL 10 MG RE SUPP
10.0000 mg | Freq: Every day | RECTAL | Status: DC | PRN
  Filled 2013-05-04: qty 1

## 2013-05-04 MED ORDER — ALUM & MAG HYDROXIDE-SIMETH 200-200-20 MG/5ML PO SUSP
30.0000 mL | Freq: Four times a day (QID) | ORAL | Status: DC | PRN
  Administered 2013-05-07: 30 mL via ORAL
  Filled 2013-05-04: qty 30

## 2013-05-04 MED ORDER — INSULIN GLARGINE 100 UNIT/ML ~~LOC~~ SOLN
10.0000 [IU] | Freq: Every day | SUBCUTANEOUS | Status: DC
  Administered 2013-05-04: 10 [IU] via SUBCUTANEOUS
  Filled 2013-05-04: qty 0.1

## 2013-05-04 MED ORDER — SODIUM CHLORIDE 0.9 % IV SOLN
INTRAVENOUS | Status: AC
  Administered 2013-05-04: 19:00:00 via INTRAVENOUS
  Administered 2013-05-05: 50 mL/h via INTRAVENOUS

## 2013-05-04 MED ORDER — HYDROMORPHONE HCL PF 1 MG/ML IJ SOLN
1.0000 mg | Freq: Once | INTRAMUSCULAR | Status: AC
  Administered 2013-05-04: 1 mg via INTRAVENOUS
  Filled 2013-05-04: qty 1

## 2013-05-04 MED ORDER — BIOTENE DRY MOUTH MT LIQD: 15.0000 mL | OROMUCOSAL | Status: DC | PRN

## 2013-05-04 MED ORDER — MENTHOL 3 MG MT LOZG
1.0000 | LOZENGE | OROMUCOSAL | Status: DC | PRN
  Administered 2013-05-05: 3 mg via ORAL
  Filled 2013-05-04 (×2): qty 9

## 2013-05-04 MED ORDER — IOHEXOL 300 MG/ML  SOLN
50.0000 mL | Freq: Once | INTRAMUSCULAR | Status: AC | PRN
  Administered 2013-05-04: 50 mL via ORAL

## 2013-05-04 MED ORDER — ONDANSETRON HCL 4 MG/2ML IJ SOLN
4.0000 mg | Freq: Four times a day (QID) | INTRAMUSCULAR | Status: DC | PRN
  Administered 2013-05-05: 4 mg via INTRAVENOUS
  Filled 2013-05-04 (×2): qty 2

## 2013-05-04 MED ORDER — MELOXICAM 7.5 MG PO TABS
7.5000 mg | ORAL_TABLET | Freq: Every day | ORAL | Status: DC
  Administered 2013-05-04 – 2013-05-05 (×2): 7.5 mg via ORAL
  Filled 2013-05-04 (×2): qty 1

## 2013-05-04 MED ORDER — ONDANSETRON HCL 4 MG PO TABS: 4.0000 mg | ORAL_TABLET | Freq: Four times a day (QID) | ORAL | Status: DC | PRN

## 2013-05-04 MED ORDER — ACETAMINOPHEN 325 MG PO TABS: 650.0000 mg | ORAL_TABLET | Freq: Four times a day (QID) | ORAL | Status: DC | PRN

## 2013-05-04 MED ORDER — ENOXAPARIN SODIUM 40 MG/0.4ML ~~LOC~~ SOLN
40.0000 mg | SUBCUTANEOUS | Status: DC
  Administered 2013-05-05 – 2013-05-08 (×4): 40 mg via SUBCUTANEOUS
  Filled 2013-05-04 (×4): qty 0.4

## 2013-05-04 MED ORDER — ZINC 10 MG MT LOZG: 10.0000 mg | LOZENGE | Freq: Every day | OROMUCOSAL | Status: DC

## 2013-05-04 MED ORDER — VITAMIN D3 25 MCG (1000 UNIT) PO TABS
5000.0000 [IU] | ORAL_TABLET | Freq: Every day | ORAL | Status: DC
  Administered 2013-05-04 – 2013-05-08 (×5): 5000 [IU] via ORAL
  Filled 2013-05-04 (×5): qty 5

## 2013-05-04 MED ORDER — VANCOMYCIN HCL IN DEXTROSE 1-5 GM/200ML-% IV SOLN
1000.0000 mg | Freq: Three times a day (TID) | INTRAVENOUS | Status: DC
  Administered 2013-05-04 – 2013-05-07 (×8): 1000 mg via INTRAVENOUS
  Filled 2013-05-04 (×10): qty 200

## 2013-05-04 MED ORDER — ACETAMINOPHEN 650 MG RE SUPP: 650.0000 mg | Freq: Four times a day (QID) | RECTAL | Status: DC | PRN

## 2013-05-04 MED ORDER — PROMETHAZINE HCL 25 MG/ML IJ SOLN
12.5000 mg | Freq: Four times a day (QID) | INTRAMUSCULAR | Status: DC | PRN
  Administered 2013-05-05 (×2): 12.5 mg via INTRAVENOUS
  Filled 2013-05-04 (×2): qty 1

## 2013-05-04 MED ORDER — IOHEXOL 300 MG/ML  SOLN
100.0000 mL | Freq: Once | INTRAMUSCULAR | Status: AC | PRN
  Administered 2013-05-04: 100 mL via INTRAVENOUS

## 2013-05-04 MED ORDER — PANTOPRAZOLE SODIUM 40 MG PO TBEC
40.0000 mg | DELAYED_RELEASE_TABLET | Freq: Every day | ORAL | Status: DC
  Administered 2013-05-04 – 2013-05-08 (×5): 40 mg via ORAL
  Filled 2013-05-04 (×5): qty 1

## 2013-05-04 MED ORDER — CANAGLIFLOZIN 100 MG PO TABS
1.0000 | ORAL_TABLET | Freq: Every day | ORAL | Status: DC
  Administered 2013-05-04 – 2013-05-08 (×4): 100 mg via ORAL
  Filled 2013-05-04 (×5): qty 1

## 2013-05-04 MED ORDER — LIDOCAINE-PRILOCAINE 2.5-2.5 % EX CREA
1.0000 "application " | TOPICAL_CREAM | Freq: Once | CUTANEOUS | Status: DC
  Filled 2013-05-04: qty 5

## 2013-05-04 MED ORDER — LORAZEPAM 2 MG/ML IJ SOLN: 1.0000 mg | INTRAMUSCULAR | Status: DC | PRN

## 2013-05-04 MED ORDER — SODIUM CHLORIDE 0.9 % IV BOLUS (SEPSIS)
1000.0000 mL | Freq: Once | INTRAVENOUS | Status: AC
  Administered 2013-05-04: 1000 mL via INTRAVENOUS

## 2013-05-04 MED ORDER — HYDROMORPHONE HCL PF 1 MG/ML IJ SOLN
1.0000 mg | INTRAMUSCULAR | Status: DC | PRN
  Administered 2013-05-04 – 2013-05-07 (×5): 1 mg via INTRAVENOUS
  Filled 2013-05-04: qty 2
  Filled 2013-05-04 (×4): qty 1

## 2013-05-04 MED ORDER — PIPERACILLIN-TAZOBACTAM 3.375 G IVPB
3.3750 g | Freq: Three times a day (TID) | INTRAVENOUS | Status: DC
  Administered 2013-05-04 – 2013-05-07 (×8): 3.375 g via INTRAVENOUS
  Filled 2013-05-04 (×10): qty 50

## 2013-05-04 NOTE — Consult Note (Signed)
Name: Jimmy Christensen MRN: [id number] DOB: Sep 15, 1955    ADMISSION DATE:  05/04/2013 CONSULTATION DATE:  1/19  REFERRING MD :  Betsey Holiday  PRIMARY SERVICE:  [facility]   CHIEF COMPLAINT:  Dyspnea/ possible pneumothorax   BRIEF PATIENT DESCRIPTION:   This is a 58 year old male recently diagnosed w/ stage IV pancreatic cancer in December. He is receiving his chemotherapy at [facility] in [place] (last chemo 1/15, and recently had Pleurx tube placed on 1/14, drained last 1/18) . Presents to ER at [facility] on 1/19 with 1d h/o progressive abd pain, fever and associated dyspnea. On eval pt found to have large right PTX. Per EDP had sig rush of air when vac bottle attached. PCCM asked to eval to assist in management of PTX.   SIGNIFICANT EVENTS / STUDIES:  Last chemo 1/15  Right pleur-x tube placed 1/14 To ER 1/17 with constipation  1/19 presents w/ abd pain, but found to have large PTX on film. Placed Pleur-x to sahara   LINES / TUBES: Right pleur-x tube 1/14>>>  CULTURES:  ANTIBIOTICS:  HISTORY OF PRESENT ILLNESS:    This is a 58 year old male recently diagnosed w/ stage IV pancreatic cancer in December. He is receiving his chemotherapy at [facility] in [place] (last chemo 1/15, and recently had Pleurx tube placed on 1/14, drained last 1/18) . Presents to ER at [facility] on 1/19 with 1d h/o progressive abd pain, fever and associated dyspnea. On eval pt found to have large right PTX. Per EDP had sig rush of air when vac bottle attached. PCCM asked to eval to assist in management of PTX.   PAST MEDICAL HISTORY :  Past Medical History  Diagnosis Date  . Overweight   . Dyslipidemia   . CAD (coronary artery disease)     [name] (DES) stent 2006; Nuclear stress 05/08, EF 62%, no ischemia  . Ejection fraction     50% catheterization 2006 /  60% nuclear, 2008  . MI (myocardial infarction)   . Kidney stones   . Headache(784.0)   . Carotid artery disease     Doppler, May, 2013,  0-39% bilateral  . Ventral hernia     First noted may, 2014  . Pancreatic cancer   . Constipation   . Chemotherapy-induced nausea   . Pleural effusion   . Pleural effusion on right    Past Surgical History  Procedure Laterality Date  . Cardiac catheterization  2006    EF 50%; 62% nuclear 2008  . Kidney stone surgery    . Ankle surgery    . Coronary angioplasty    . Insertion / placement pleural catheter     Prior to Admission medications   Medication Sig Start Date End Date Taking? Authorizing Provider  aspirin 81 MG EC tablet Take 81 mg by mouth daily.     Yes Historical Provider, MD  atorvastatin (LIPITOR) 40 MG tablet Take 1 tablet (40 mg total) by mouth daily. 09/25/12 10/30/13 Yes Carlena Bjornstad, MD  Canagliflozin [facility]) 100 MG TABS Take 1 tablet by mouth daily.   Yes Historical Provider, MD  cholecalciferol (VITAMIN D) 1000 UNITS tablet Take 5,000 Units by mouth daily.   Yes Historical Provider, MD  DULoxetine (CYMBALTA) 60 MG capsule Take 60 mg by mouth daily.   Yes Historical Provider, MD  fluticasone (FLONASE) 50 MCG/ACT nasal spray Place 1 spray into both nostrils daily. 03/14/13  Yes Historical Provider, MD  HYDROmorphone (DILAUDID) 2  MG tablet Take by mouth every 6 (six) hours as needed for severe pain.   Yes Historical Provider, MD  insulin glargine (LANTUS) 100 UNIT/ML injection Inject 10 Units into the skin at bedtime.    Yes Historical Provider, MD  lactulose (CHRONULAC) 10 GM/15ML solution Take 10 g by mouth 2 (two) times daily.   Yes Historical Provider, MD  lidocaine-prilocaine (EMLA) cream Apply 1 application topically once.   Yes Historical Provider, MD  meloxicam (MOBIC) 7.5 MG tablet Take 7.5 mg by mouth daily.   Yes Historical Provider, MD  metFORMIN (GLUCOPHAGE) 500 MG tablet Take 1 tablet by mouth daily. 02/18/13  Yes Historical Provider, MD  metoprolol succinate (TOPROL-XL) 25 MG 24 hr tablet Take 1 tablet (25 mg total) by mouth daily. 09/25/12 10/30/13 Yes  Carlena Bjornstad, MD  morphine (MS CONTIN) 30 MG 12 hr tablet Take 30 mg by mouth every 12 (twelve) hours.   Yes Historical Provider, MD  niacin (NIASPAN) 1000 MG CR tablet Take 1 tablet (1,000 mg total) by mouth at bedtime. 09/24/12 10/29/13 Yes Carlena Bjornstad, MD  ondansetron (ZOFRAN) 4 MG tablet Take 4 mg by mouth every 8 (eight) hours as needed for nausea or vomiting.   Yes Historical Provider, MD  pantoprazole (PROTONIX) 40 MG tablet Take 1 tablet (40 mg total) by mouth daily. 06/13/12 06/13/13 Yes Carlena Bjornstad, MD  prochlorperazine (COMPAZINE) 5 MG tablet Take 5 mg by mouth every 6 (six) hours as needed for nausea or vomiting.   Yes Historical Provider, MD  belladonna-opium (B&O SUPPRETTES) 16.2-30 MG suppository Place 1 suppository rectally every 8 (eight) hours as needed for pain. 05/02/13   Carmin Muskrat, MD  nitroGLYCERIN (NITROSTAT) 0.4 MG SL tablet Place 1 tablet (0.4 mg total) under the tongue every 5 (five) minutes as needed. May repeat up to 3 doses. 08/08/11   Carlena Bjornstad, MD  senna-docusate (SENOKOT-S) 8.6-50 MG per tablet Take 2 tablets by mouth daily. 05/02/13   Carmin Muskrat, MD   No Known Allergies  FAMILY HISTORY:  No family history on file. SOCIAL HISTORY:  reports that he has quit smoking. He does not have any smokeless tobacco history on file. He reports that he drinks alcohol. He reports that he does not use illicit drugs.  REVIEW OF SYSTEMS:   Constitutional: Negative for fever, chills, weight loss, malaise/fatigue and diaphoresis.  HENT: Negative for hearing loss, ear pain, nosebleeds, congestion, sore throat, neck pain, tinnitus and ear discharge.   Respiratory: Negative for cough, hemoptysis, sputum production, shortness of breath, w/ some chest pain associated w/ deep breath,  wheezing and stridor.   Cardiovascular: Negative for chest pain, palpitations, orthopnea, claudication, leg swelling and PND.  Gastrointestinal: Negative for heartburn, nausea, vomiting,  abdominal pain, diarrhea, constipation, blood in stool and melena.  Genitourinary: Negative for dysuria, urgency, frequency, hematuria and flank pain.  Musculoskeletal: Negative for myalgias, back pain, joint pain and falls.  Skin: Negative for itching and rash.  Neurological: Negative for dizziness, tingling, tremors, sensory change, speech change, focal weakness, seizures, loss of consciousness, weakness and headaches.  Endo/Heme/Allergies: Negative for environmental allergies and polydipsia. Does not bruise/bleed easily.  SUBJECTIVE:  Feel better  VITAL SIGNS: Temp:  [97.8 F (36.6 C)] 97.8 F (36.6 C) (01/19 1114) Pulse Rate:  [136] 136 (01/19 1114) Resp:  [19] 19 (01/19 1114) BP: (100)/(67) 100/67 mmHg (01/19 1114) SpO2:  [93 %-96 %] 93 % (01/19 1114)  PHYSICAL EXAMINATION: General:  58 year old male, appears stated  age, no acute distress.  Neuro:  Awake, oriented  HEENT:  [place] no JVD  Cardiovascular:  rrr Lungs:  Decreased on right. Right pleur-x now connected to 20 cmH20 1/7 air leak, good tidal  Abdomen: soft, tender + bowel sounds Musculoskeletal: intact  Skin:  Intact    Recent Labs Lab 05/04/13 1135  NA 132*  K 4.2  CL 93*  CO2 17*  BUN 17  CREATININE 0.61  GLUCOSE 157*    Recent Labs Lab 05/02/13 1130 05/04/13 1135  HGB 11.7* 15.5  HCT 35.1* 44.7  WBC 5.8 4.0  PLT 208 216   Dg Chest Port 1 View  05/04/2013   CLINICAL DATA:  Shortness of breath.  EXAM: PORTABLE CHEST - 1 VIEW  COMPARISON:  None.  FINDINGS: Right chest tube is noted of the right lower chest. A right pneumothorax is present. The pneumothorax is prominent and may be under tension. Atelectasis and effusion on the right noted. Port-A-Cath noted in good anatomic position. Small left pleural effusion. Mild left base atelectasis. Heart size normal.  IMPRESSION: 1. Prominent right pneumothorax with with probable tension. Right chest tube is noted over the right chest. 2. Bibasilar atelectasis with  bilateral pleural effusions. Critical Value/emergent results were called by telephone at the time of interpretation on 05/04/2013 at 11:50 AM to Dr. Joseph Berkshire , who verbally acknowledged these results.   Electronically Signed   By: Marcello Moores  Register   On: 05/04/2013 11:50    ASSESSMENT / PLAN: Abd pain/fever in setting of known stage IV pancreatic cancer w/ mets to omentum, peritoneum and lymph. Undergoing chemotherapy at [facility].  Plan Agree w/ CT imaging of abd to rule out abd sepsis Defer further diagnostics/rx  to IM service   Right Pneumothorax vs trapped lung  S/p pleur-x tube placed 1/14 for recurrent pleural effusion (possibly malignant).  rec Have placed pleur-x to sahara devise, will keep on 20 cmH20 for overnight Re-assess CXR in am  Mild metabolic acidosis + AG, hemodynamically stable  rec Check lactate  Pulmonary and [place] Pager: [phone]  05/04/2013, 1:34 PM  Reviewed above, examined pt, and agree with assessment/plan.  58 yo with recurrent pleural effusion s/p pleurx catheter.  Has Rt pneumothorax >> improved after attaching pleurx to sahara.  Continue pleurx to suction and f/u CXR.  Chesley Mires, MD [facility] Pulmonary/Critical Care 05/04/2013, 4:46 PM Pager:  [phone] After 3pm call: [phone]

## 2013-05-04 NOTE — ED Provider Notes (Addendum)
CSN: [id number]     Arrival date & time 05/04/13  1105 History   First MD Initiated Contact with Patient 05/04/13 1113     Chief Complaint  Patient presents with  . Shortness of Breath  . Diarrhea  . Emesis   (Consider location/radiation/quality/duration/timing/severity/associated sxs/prior Treatment) HPI Comments: Patient presents to the ER for evaluation of multiple complaints. Patient has been experiencing increasing abdominal pain for several days. He has felt constipated in the past, but has now developed diarrhea for the last couple of days. Patient has had nausea without vomiting. He has been febrile at home, as high as 101 earlier today. He has had a cough and chest congestion.  Patient reports that he has had chronic back pain secondary to his pancreatic cancer. He has developed progressively worsening, now severe diffuse abdominal pain over the last few days.  Patient is a 58 y.o. Christensen presenting with shortness of breath, diarrhea, and vomiting.  Shortness of Breath Associated symptoms: abdominal pain, cough, diaphoresis, fever and vomiting   Diarrhea Associated symptoms: abdominal pain, chills, diaphoresis, fever and vomiting   Emesis Associated symptoms: abdominal pain, chills and diarrhea     Past Medical History  Diagnosis Date  . Overweight   . Dyslipidemia   . CAD (coronary artery disease)     [name] (DES) stent 2006; Nuclear stress 05/08, EF 62%, no ischemia  . Ejection fraction     50% catheterization 2006 /  60% nuclear, 2008  . MI (myocardial infarction)   . Kidney stones   . Headache(784.0)   . Carotid artery disease     Doppler, May, 2013, 0-39% bilateral  . Ventral hernia     First noted may, 2014  . Pancreatic cancer   . Constipation   . Chemotherapy-induced nausea   . Pleural effusion   . Pleural effusion on right    Past Surgical History  Procedure Laterality Date  . Cardiac catheterization  2006    EF 50%; 62% nuclear 2008  . Kidney stone  surgery    . Ankle surgery    . Coronary angioplasty    . Insertion / placement pleural catheter     No family history on file. History  Substance Use Topics  . Smoking status: Former Games developer  . Smokeless tobacco: Not on file  . Alcohol Use: Yes     Comment: rare    Review of Systems  Constitutional: Positive for fever, chills and diaphoresis.  Respiratory: Positive for cough and shortness of breath.   Gastrointestinal: Positive for nausea, vomiting, abdominal pain and diarrhea.  All other systems reviewed and are negative.    Allergies  Review of patient's allergies indicates no known allergies.  Home Medications   Current Outpatient Rx  Name  Route  Sig  Dispense  Refill  . aspirin 81 MG EC tablet   Oral   Take 81 mg by mouth daily.           Marland Kitchen atorvastatin (LIPITOR) 40 MG tablet   Oral   Take 1 tablet (40 mg total) by mouth daily.   90 tablet   3   . Canagliflozin (INVOKANA) 100 MG TABS   Oral   Take 1 tablet by mouth daily.         . cholecalciferol (VITAMIN D) 1000 UNITS tablet   Oral   Take 5,000 Units by mouth daily.         . DULoxetine (CYMBALTA) 60 MG capsule   Oral   Take  60 mg by mouth daily.         . fluticasone (FLONASE) 50 MCG/ACT nasal spray   Each Nare   Place 1 spray into both nostrils daily.         Marland Kitchen HYDROmorphone (DILAUDID) 2 MG tablet   Oral   Take by mouth every 6 (six) hours as needed for severe pain.         Marland Kitchen insulin glargine (LANTUS) 100 UNIT/ML injection   Subcutaneous   Inject 10 Units into the skin at bedtime.          Marland Kitchen lactulose (CHRONULAC) 10 GM/15ML solution   Oral   Take 10 g by mouth 2 (two) times daily.         Marland Kitchen lidocaine-prilocaine (EMLA) cream   Topical   Apply 1 application topically once.         . meloxicam (MOBIC) 7.5 MG tablet   Oral   Take 7.5 mg by mouth daily.         . metFORMIN (GLUCOPHAGE) 500 MG tablet   Oral   Take 1 tablet by mouth daily.         . metoprolol  succinate (TOPROL-XL) 25 MG 24 hr tablet   Oral   Take 1 tablet (25 mg total) by mouth daily.   90 tablet   3   . morphine (MS CONTIN) 30 MG 12 hr tablet   Oral   Take 30 mg by mouth every 12 (twelve) hours.         . niacin (NIASPAN) 1000 MG CR tablet   Oral   Take 1 tablet (1,000 mg total) by mouth at bedtime.   90 tablet   3   . ondansetron (ZOFRAN) 4 MG tablet   Oral   Take 4 mg by mouth every 8 (eight) hours as needed for nausea or vomiting.         . pantoprazole (PROTONIX) 40 MG tablet   Oral   Take 1 tablet (40 mg total) by mouth daily.   90 tablet   1   . prochlorperazine (COMPAZINE) 5 MG tablet   Oral   Take 5 mg by mouth every 6 (six) hours as needed for nausea or vomiting.         . belladonna-opium (B&O SUPPRETTES) Jimmy.2-30 MG suppository   Rectal   Place 1 suppository rectally every 8 (eight) hours as needed for pain.   12 suppository   0   . nitroGLYCERIN (NITROSTAT) 0.4 MG SL tablet   Sublingual   Place 1 tablet (0.4 mg total) under the tongue every 5 (five) minutes as needed. May repeat up to 3 doses.   25 tablet   11   . senna-docusate (SENOKOT-S) 8.6-50 MG per tablet   Oral   Take 2 tablets by mouth daily.   30 tablet   0    BP 100/67  Pulse 136  Temp(Src) 97.8 F (36.6 C) (Oral)  Resp 19  SpO2 93% Physical Exam  Constitutional: He is oriented to person, place, and time. He appears well-developed and well-nourished. He appears distressed.  HENT:  Head: Normocephalic and atraumatic.  Right Ear: Hearing normal.  Left Ear: Hearing normal.  Nose: Nose normal.  Mouth/Throat: Oropharynx is clear and moist and mucous membranes are normal.  Eyes: Conjunctivae and EOM are normal. Pupils are equal, round, and reactive to light.  Neck: Normal range of motion. Neck supple.  Cardiovascular: Regular rhythm, S1 normal and S2 normal.  Tachycardia present.  Exam reveals no gallop and no friction rub.   No murmur heard. Pulmonary/Chest:  Tachypnea noted. No respiratory distress. He has decreased breath sounds in the right upper field, the right middle field and the right lower field. He exhibits no tenderness.  Abdominal: Soft. Normal appearance and bowel sounds are normal. There is no hepatosplenomegaly. There is generalized tenderness. There is no rebound, no guarding, no tenderness at McBurney's point and negative Murphy's sign. No hernia.  Musculoskeletal: Normal range of motion.  Neurological: He is alert and oriented to person, place, and time. He has normal strength. No cranial nerve deficit or sensory deficit. Coordination normal. GCS eye subscore is 4. GCS verbal subscore is 5. GCS motor subscore is 6.  Skin: Skin is warm and intact. No rash noted. He is diaphoretic. No cyanosis.  Psychiatric: He has a normal mood and affect. His speech is normal and behavior is normal. Thought content normal.    ED Course  Procedures (including critical care time) Labs Review Labs Reviewed  CBC WITH DIFFERENTIAL - Abnormal; Notable for the following:    Neutrophils Relative % 87 (*)    Lymphocytes Relative 11 (*)    Monocytes Relative 2 (*)    Lymphs Abs 0.4 (*)    All other components within normal limits  COMPREHENSIVE METABOLIC PANEL - Abnormal; Notable for the following:    Sodium 132 (*)    Chloride 93 (*)    CO2 17 (*)    Glucose, Bld 157 (*)    Total Protein 5.2 (*)    Albumin 2.0 (*)    All other components within normal limits  LIPASE, BLOOD - Abnormal; Notable for the following:    Lipase 8 (*)    All other components within normal limits  PRO B NATRIURETIC PEPTIDE - Abnormal; Notable for the following:    Pro B Natriuretic peptide (BNP) 542.0 (*)    All other components within normal limits  GLUCOSE, CAPILLARY - Abnormal; Notable for the following:    Glucose-Capillary 145 (*)    All other components within normal limits  CG4 I-STAT (LACTIC ACID) - Abnormal; Notable for the following:    Lactic Acid, Venous  2.93 (*)    All other components within normal limits  CULTURE, BLOOD (ROUTINE X 2)  CULTURE, BLOOD (ROUTINE X 2)  BODY FLUID CULTURE  GRAM STAIN  TROPONIN I  URINALYSIS, ROUTINE W REFLEX MICROSCOPIC  BODY FLUID CELL COUNT WITH DIFFERENTIAL   Imaging Review Dg Chest Port 1 View  05/04/2013   CLINICAL DATA:  Shortness of breath.  EXAM: PORTABLE CHEST - 1 VIEW  COMPARISON:  None.  FINDINGS: Right chest tube is noted of the right lower chest. A right pneumothorax is present. The pneumothorax is prominent and may be under tension. Atelectasis and effusion on the right noted. Port-A-Cath noted in good anatomic position. Small left pleural effusion. Mild left base atelectasis. Heart size normal.  IMPRESSION: 1. Prominent right pneumothorax with with probable tension. Right chest tube is noted over the right chest. 2. Bibasilar atelectasis with bilateral pleural effusions. Critical Value/emergent results were called by telephone at the time of interpretation on 05/04/2013 at 11:50 AM to Dr. Joseph Berkshire , who verbally acknowledged these results.   Electronically Signed   By: Marcello Moores  Register   On: 05/04/2013 11:50    EKG Interpretation    Date/Time:  Monday May 04 2013 11:11:03 EST Ventricular Rate:  134 PR Interval:  115 QRS Duration: 75 QT  Interval:  307 QTC Calculation: 458 R Axis:   90 Text Interpretation:  Sinus tachycardia Borderline right axis deviation Otherwise within normal limits Confirmed by Aysha Livecchi  MD, Blaize Epple ([id number]) on 05/04/2013 11:32:51 AM            MDM  Diagnosis: 1. R Pneumothorax 2. Fever 3. Abdominal Pain 4. Pancreatic CA 5. Ascites/Peritoneal Carcinomatosis  Patient presents to the ER for evaluation of multiple problems. Patient has been experiencing nausea, vomiting and abdominal pain. He has been febrile at home. Patient's abdominal exam revealed diffuse abdominal tenderness, no obvious peritonitis.  Patient was also complaining of shortness of  breath. Patient has recent diagnosis of pancreatic cancer and has had recurrent right-sided pleural effusion, now has Pleur-X tube in place. Patient had decreased breath sounds on the right side. It is not clear if this was from fluid or other cause such as pneumonia. X-ray, however, surprisingly showed significant pneumothorax on the right. This was discussed briefly with cardiothoracic surgery as well as pulmonology. Patient was worked up the [employer] in an attempt to reexpand the lung. He seems to have symptomatically improved after one bottle. He was also treated with Dialudid for analgesia. Pulmonology has evaluated the patient and will continue to manage his pneumothorax. Although the radiologist expressed concern over possible attention pathology, clinically he does not appear to have a tension pneumothorax. Pleural fluid sent for Gram stain and culture.  The patient underwent CT scan of abdomen to further evaluate her abdominal pain and fever. The patient shows evidence of extensive peritoneal carcinomatosis, but no obvious inflammatory infectious process.  Fever etiology possibly from ascites with SBP vs. Pleural fluid infection.  The patient will require hospitalization for further management.  CRITICAL CARE Performed by: Orpah Greek   Total critical care time: 17min  Critical care time was exclusive of separately billable procedures and treating other patients.  Critical care was necessary to treat or prevent imminent or life-threatening deterioration.  Critical care was time spent personally by me on the following activities: development of treatment plan with patient and/or surrogate as well as nursing, discussions with consultants, evaluation of patient's response to treatment, examination of patient, obtaining history from patient or surrogate, ordering and performing treatments and interventions, ordering and review of laboratory studies, ordering and review of  radiographic studies, pulse oximetry and re-evaluation of patient's condition.     Orpah Greek, MD 05/04/13 [place], MD 05/04/13 [phone]

## 2013-05-04 NOTE — ED Notes (Signed)
Pt ret from CT at this time, tol well.  Pt remains with pleuravac to suction with sm amt serosang drng return.  Pt reports improvement of SOB, continues to c/o pain to abd, pt med per [facility].  Family at bs.  Pt denies further needs/complaints at thistime.

## 2013-05-04 NOTE — ED Notes (Signed)
Per EMS: Pt was recently diagnosed with pancreatic cancer in December. Pt reports having two rounds of chemotherapy, last on 04/27/13. Pt reports N/V and constipation since this treatment. Pt reports on Saturday he went to the ED and received an enema. Pt reports diarrhea since. Pt reports chills, fever, and shortness of breath. Pt is A/O x4.

## 2013-05-04 NOTE — Progress Notes (Signed)
ANTIBIOTIC CONSULT NOTE - INITIAL  Pharmacy Consult for vancomycin and Zosyn Indication: possible peritonitis  No Known Allergies  Patient Measurements:  Body Weight:  85.3kg  Vital Signs: Temp: 97.8 F (36.6 C) (01/19 1114) Temp src: Oral (01/19 1114) BP: 108/55 mmHg (01/19 1454) Pulse Rate: 106 (01/19 1454)  Labs:  Recent Labs  05/02/13 1130 05/04/13 1135  WBC 5.8 4.0  HGB 11.7* 15.5  PLT 208 216  CREATININE  --  0.61     Medical History: Past Medical History  Diagnosis Date  . Overweight   . Dyslipidemia   . CAD (coronary artery disease)     [name] (DES) stent 2006; Nuclear stress 05/08, EF 62%, no ischemia  . Ejection fraction     50% catheterization 2006 /  60% nuclear, 2008  . MI (myocardial infarction)   . Kidney stones 1981  . Carotid artery disease     Doppler, May, 2013, 0-39% bilateral  . Ventral hernia     First noted may, 2014  . Pancreatic cancer   . Constipation   . Chemotherapy-induced nausea   . Pleural effusion on right   . Diabetes mellitus type 2 in nonobese   . Cataract     Medications:  Scheduled:   Infusions:  . sodium chloride    . piperacillin-tazobactam    . vancomycin     Assessment: 18 yoM admitted 1/19 with R pneumothorax and abdominal pain in setting of known stage IV pancreatic cancer w/ mets to omentum, peritoneum and lymph. Pharmacy has been consulted to dose vancomycin and Zosyn for suspected peritonitis   Antiinfectives 1/19 >> Zosyn >> 1/19 >> vancomycin >>    Tmax: afebrile WBCs: WNL Renal: SCr 0.61 (baseline appears 0.8-0.9), CrCl >100 ml/min CG&N  Microbiology 1/19 blood x2: collected   Goal of Therapy:  Vancomycin trough level 15-20 mcg/ml eradication of indfection  Plan:  - Zosyn 3.375G IV q8h to be infused over 4 hours - vancomycin 2g IV x 1 as a loading dose - vancomycin 1g IV q8h starting 1/20 at midnight - vancomycin trough at steady state if indicated - follow-up clinical course, culture  results, renal function - follow-up antibiotic de-escalation and length of therapy  Thank you for the consult.  Johny Drilling, PharmD, BCPS Clinical Pharmacist Pager: [phone] Pharmacy: [phone] 05/04/2013 4:22 PM

## 2013-05-04 NOTE — H&P (Signed)
[facility] History and Physical  Jimmy Christensen EGB:[id number] DOB: 1955/12/24 DOA: 05/04/2013  Referring physician:  Carmin Muskrat PCP:  [name], MD   Chief Complaint:  Abdominal pain  HPI:  The patient is a 58 y.o. year-old male with history of CAD s/p MI with DES in 2006 followed by Dr. [name] and recently diagnosed metastatic pancreatic cancer, seen previously by Dr. [name], but currently undergoing treatment at [facility] in [place] who presents with progressive SOB, fever, and abdominal pain.  The patient was last at their baseline health several months ago.  He was diagnosed with pancreatic cancer in fall of 2014 due to abdominal pain which radiated to the back associated with unexplained weight loss.  At the time of diagnosis, he had a 4cm tumor of the tail of the pancreas and peritoneal disease.  He was completed two cycles of FOLFIRINOX, last cycle completed 1 week ago in [place].  His has tolerated chemotherapy well, however, his course has been complicated by a right malignant pleural effusion.  About 3-4 weeks ago, he underwent thoracentesis with removal of 6L of fluid, however, his fluid reaccumulated so he had a pleurex catheter placed 1 week prior to admission.  Shortly after placement (within 24 hours), he became hypoxic and was admitted for two days in [place] with pneumothorax.  He had his pleurex catheter placed to suction and the lung reexpanded to 80%.  He was discharged 3 days ago.  Since going home, he has become progressively more [name] of breath.  Yesterday, he developed sudden onset 10/10 LUQ pain with radiation to the epigastrium and similar sharp pain in the suprapubic region with radiation to the RLQ.  The pain has waxed and waned from 7/10 up to 10/10, but did not improve until receiving pain medication in ER.  He also had a fever to 101F this AM.  He has had SOB without productive cough, + nausea without vomiting this morning for which he took antiemetics.  Last week he  was constipated, however, he received an enema and then subsequently has been having watery diarrhea with some near-incontinence.    In the ER, he was borderline hypotensive to 100/50s, tachycardic to 130s.  Labs were notable for Na 132, chloride 93, bicarb 17, hgb 11.7.  CXR demonstrated large right pneumothorax that appeared to be under some tension.  Pulmonology was consulted and placed his pleurex catheter to suction which has improved his SOB.  CT scan abd/pelvis demonstrates stable pancreatic mass, however, he has a new left pleural effusion, worsening of his peritoneal mets, and new ascites.  He had blood cultures and was given vanc and zosyn for possible sepsis given fevers + tachycardia.  He is being admitted for ongoing management of his PTX, correction of metabolic disturbance, and further work up of his abdominal pain.  He states he wants to fight his cancer and wants to be full code and everything possible done to cure his cancer, however, he also understands that he is going to die from his cancer and he wants to be comfortable through the process.  He is amenable to having palliative care assist with symptom management.    Review of Systems:  General:  + fevers as above.  + weight loss HEENT:  Denies changes to hearing and vision, rhinorrhea, sinus congestion, sore throat CV:  Denies chest pain and palpitations, lower extremity edema.  PULM:  Per HPI GI:  Per HPI GU:  Denies dysuria, frequency, urgency ENDO:  Denies polyuria,  polydipsia.   HEME:  Denies hematemesis, blood in stools, melena, abnormal bruising or bleeding.  LYMPH:  Denies lymphadenopathy.   MSK:  Denies arthralgias, myalgias.   DERM:  Denies skin rash or ulcer.   NEURO:  Denies focal numbness, weakness, slurred speech, confusion, facial droop.  PSYCH:  Denies anxiety and depression.    Past Medical History  Diagnosis Date  . Overweight   . Dyslipidemia   . CAD (coronary artery disease)     [name] (DES) stent  2006; Nuclear stress 05/08, EF 62%, no ischemia  . Ejection fraction     50% catheterization 2006 /  60% nuclear, 2008  . MI (myocardial infarction)   . Kidney stones 1981  . Carotid artery disease     Doppler, May, 2013, 0-39% bilateral  . Ventral hernia     First noted may, 2014  . Pancreatic cancer   . Constipation   . Chemotherapy-induced nausea   . Pleural effusion on right   . Diabetes mellitus type 2 in nonobese   . Cataract    Past Surgical History  Procedure Laterality Date  . Cardiac catheterization  2006    EF 50%; 62% nuclear 2008  . Kidney stone surgery    . Ankle surgery  right  . Coronary angioplasty    . Insertion / placement pleural catheter     Social History:  reports that he has quit smoking. His smoking use included Cigarettes. He has a 25 pack-year smoking history. He does not have any smokeless tobacco history on file. He reports that he drinks alcohol. He reports that he does not use illicit drugs. Lives with wife, currently very SOB with exertion, but does not use assist device.  No home health services.    No Known Allergies  Family History  Problem Relation Age of Onset  . Liver cancer Sister   . Cancer Paternal Grandmother   . Heart disease Mother   . Heart disease Father   . Stroke Sister   . Stroke Paternal Grandfather      Prior to Admission medications   Medication Sig Start Date End Date Taking? Authorizing Provider  aspirin 81 MG EC tablet Take 81 mg by mouth daily.     Yes Historical Provider, MD  atorvastatin (LIPITOR) 40 MG tablet Take 1 tablet (40 mg total) by mouth daily. 09/25/12 10/30/13 Yes Carlena Bjornstad, MD  Canagliflozin [facility]) 100 MG TABS Take 1 tablet by mouth daily.   Yes Historical Provider, MD  cholecalciferol (VITAMIN D) 1000 UNITS tablet Take 5,000 Units by mouth daily.   Yes Historical Provider, MD  DULoxetine (CYMBALTA) 60 MG capsule Take 60 mg by mouth daily.   Yes Historical Provider, MD  fluticasone (FLONASE) 50  MCG/ACT nasal spray Place 1 spray into both nostrils daily as needed.  03/14/13  Yes Historical Provider, MD  Glutamine POWD Take 10 g by mouth daily.   Yes Historical Provider, MD  HYDROmorphone (DILAUDID) 2 MG tablet Take by mouth every 6 (six) hours as needed for severe pain.   Yes Historical Provider, MD  insulin glargine (LANTUS) 100 UNIT/ML injection Inject 10 Units into the skin at bedtime.    Yes Historical Provider, MD  lactulose (CHRONULAC) 10 GM/15ML solution Take 10 g by mouth 2 (two) times daily.   Yes Historical Provider, MD  lidocaine-prilocaine (EMLA) cream Apply 1 application topically once.   Yes Historical Provider, MD  meloxicam (MOBIC) 7.5 MG tablet Take 7.5 mg by mouth daily.  Yes Historical Provider, MD  metFORMIN (GLUCOPHAGE) 500 MG tablet Take 1 tablet by mouth daily. 02/18/13  Yes Historical Provider, MD  metoprolol succinate (TOPROL-XL) 25 MG 24 hr tablet Take 1 tablet (25 mg total) by mouth daily. 09/25/12 10/30/13 Yes Carlena Bjornstad, MD  morphine (MS CONTIN) 30 MG 12 hr tablet Take 30 mg by mouth every 12 (twelve) hours.   Yes Historical Provider, MD  niacin (NIASPAN) 1000 MG CR tablet Take 1 tablet (1,000 mg total) by mouth at bedtime. 09/24/12 10/29/13 Yes Carlena Bjornstad, MD  ondansetron (ZOFRAN) 4 MG tablet Take 4 mg by mouth every 8 (eight) hours as needed for nausea or vomiting.   Yes Historical Provider, MD  pantoprazole (PROTONIX) 40 MG tablet Take 1 tablet (40 mg total) by mouth daily. 06/13/12 06/13/13 Yes Carlena Bjornstad, MD  prochlorperazine (COMPAZINE) 5 MG tablet Take 5 mg by mouth every 6 (six) hours as needed for nausea or vomiting.   Yes Historical Provider, MD  Zinc 10 MG LOZG Use as directed 10 mg in the mouth or throat daily.   Yes Historical Provider, MD  nitroGLYCERIN (NITROSTAT) 0.4 MG SL tablet Place 1 tablet (0.4 mg total) under the tongue every 5 (five) minutes as needed. May repeat up to 3 doses. 08/08/11   Carlena Bjornstad, MD  senna-docusate (SENOKOT-S)  8.6-50 MG per tablet Take 2 tablets by mouth daily. 05/02/13   Carmin Muskrat, MD   Physical Exam: Filed Vitals:   05/04/13 1106 05/04/13 1114 05/04/13 1454 05/04/13 1619  BP:  100/67 108/55 105/62  Pulse:  136 106 109  Temp:  97.8 F (36.6 C)    TempSrc:  Oral    Resp:  19 22 22   SpO2: 96% 93% 93% 94%     General:  CM, NAD  Eyes:  PERRL, anicteric, non-injected.  ENT:  Nares clear.  OP clear, non-erythematous without plaques or exudates.  MMM.  Neck:  Supple without TM or JVD.    Lymph:  No cervical, supraclavicular, or submandibular LAD.  Cardiovascular:  RRR, normal S1, S2, without m/r/g.  2+ pulses, warm extremities  Respiratory:  Diminished at the right base with some rales, then just diminished all the way to apex and tympanitic.  Left side diminished with faint rales at base, otherwise clear, no wheezes, no increased WOB.  pleurex catheter draining air and serosanguinous material  Abdomen:  Hyperactive BS.  Soft, mildly distended, TTP with some mild guarding in the LUQ without rebound and milder pain the RLQ    Skin:  No rashes or focal lesions.  Musculoskeletal:  Normal bulk and tone.  No LE edema.  Psychiatric:  A & O x 4.  Appropriate affect.  Neurologic:  CN 3-12 intact.  5/5 strength.  Sensation intact.  Labs on Admission:  Basic Metabolic Panel:  Recent Labs Lab 05/04/13 1135  NA 132*  K 4.2  CL 93*  CO2 17*  GLUCOSE 157*  BUN 17  CREATININE 0.61  CALCIUM 8.4   Liver Function Tests:  Recent Labs Lab 05/04/13 1135  AST 28  ALT 24  ALKPHOS 75  BILITOT 0.6  PROT 5.2*  ALBUMIN 2.0*    Recent Labs Lab 05/04/13 1135  LIPASE 8*   No results found for this basename: AMMONIA,  in the last 168 hours CBC:  Recent Labs Lab 05/02/13 1130 05/04/13 1135  WBC 5.8 4.0  NEUTROABS  --  3.5  HGB 11.7* 15.5  HCT 35.1* 44.7  MCV  86.2 84.2  PLT 208 216   Cardiac Enzymes:  Recent Labs Lab 05/04/13 1135  TROPONINI <0.30    BNP (last 3  results)  Recent Labs  05/04/13 1135  PROBNP 542.0*   CBG:  Recent Labs Lab 05/04/13 1123  GLUCAP 145*    Radiological Exams on Admission: Dg Chest 1 View  05/04/2013   CLINICAL DATA:  Evaluate pneumothorax, shortness of breath and chest pain  EXAM: CHEST - 1 VIEW  COMPARISON:  Earlier same day; CT abdomen pelvis - earlier same day; 03/19/2013  FINDINGS: Grossly unchanged cardiac silhouette and mediastinal contours. Stable positioning of support apparatus. There has been minimal re-expansion of the right lung with persistent small right-sided hydro pneumothorax. No significant deviation of the cardiomediastinal structures. Possible minimal increase in the amount of right-sided pleural fluid. Small amount of left-sided pleural fluid appear grossly unchanged. Improved aeration of the right lower lung with persistent bibasilar heterogeneous opacities, right greater than left. No new focal airspace opacities. No definite evidence of edema. Grossly unchanged bones.  IMPRESSION: 1. Minimal re-expansion of the right lung with persistent small right-sided hydro pneumothorax. Given the presence of a right basilar approach chest tube, this pneumothorax may be ex vacuo in etiology. Clinical correlation is advised. 2. Unchanged small left-sided pleural effusion. 3. Improved aeration of the right lung with persistent bibasilar opacities, right greater than left, atelectasis versus infiltrate.   Electronically Signed   By: Sandi Mariscal M.D.   On: 05/04/2013 14:55   Ct Abdomen Pelvis W Contrast  05/04/2013   CLINICAL DATA:  Abdominal pain. Fever. Chills. Undergoing chemotherapy for pancreatic carcinoma.  EXAM: CT ABDOMEN AND PELVIS WITH CONTRAST  TECHNIQUE: Multidetector CT imaging of the abdomen and pelvis was performed using the standard protocol following bolus administration of intravenous contrast.  CONTRAST:  172mL OMNIPAQUE IOHEXOL 300 MG/ML  SOLN  COMPARISON:  03/19/2013  FINDINGS: Right chest tube is  now seen in place with a right basilar hydropneumothorax. Collapse or consolidation is seen in the right lower lung. New small to moderate left pleural effusion noted.  New moderate ascites is seen. Peritoneal enhancement is also noted, and omental caking is again demonstrated, consistent with peritoneal carcinomatosis. No evidence of bowel obstruction.  Hypovascular mass in the pancreatic body is again seen which currently measures 4.1 x 3.8 cm on image 69 this is not significant change since previous study.  Probable tiny sub-cm hepatic cysts are stable. No definite liver masses are identified. The spleen, and adrenal glands are normal in appearance. Tiny renal cyst remains stable and there is no evidence of renal masses or hydronephrosis. No lymphadenopathy identified no evidence focal inflammatory process or bowel obstruction. No suspicious bone lesions identified.  IMPRESSION: New ascites and findings consistent with increased peritoneal carcinomatosis.  Large right basilar hydropneumothorax with right chest tube in place.  Collapse or consolidation in right lower lung. Infection or neoplasm cannot be excluded.  New small to moderate left pleural effusion.  Stable 4 cm hypovascular mass the pancreatic body, consistent with known primary pancreatic carcinoma.   Electronically Signed   By: Earle Gell M.D.   On: 05/04/2013 14:52   Dg Chest Port 1 View  05/04/2013   CLINICAL DATA:  Shortness of breath.  EXAM: PORTABLE CHEST - 1 VIEW  COMPARISON:  None.  FINDINGS: Right chest tube is noted of the right lower chest. A right pneumothorax is present. The pneumothorax is prominent and may be under tension. Atelectasis and effusion on the right noted.  Port-A-Cath noted in good anatomic position. Small left pleural effusion. Mild left base atelectasis. Heart size normal.  IMPRESSION: 1. Prominent right pneumothorax with with probable tension. Right chest tube is noted over the right chest. 2. Bibasilar atelectasis  with bilateral pleural effusions. Critical Value/emergent results were called by telephone at the time of interpretation on 05/04/2013 at 11:50 AM to Dr. Joseph Berkshire , who verbally acknowledged these results.   Electronically Signed   By: Marcello Moores  Register   On: 05/04/2013 11:50    EKG: Independently reviewed. Sinus tachycardia, no acute ST segment changes  Assessment/Plan Principal Problem:   Pneumothorax on right Active Problems:   CAD (coronary artery disease)   Pancreatic cancer with malignant pleural effusions and ascites   Metabolic acidosis, increased anion gap   Hyponatremia   Diabetes mellitus type 2 in nonobese   HCAP (healthcare-associated pneumonia)   Sepsis   Abdominal pain  ---  58 yo M with metastatic pancreatic cancer who presents with malignant pleural effusions, persistent PTX, new abdominal ascites with worsening abdominal pain.  Patient would still like aggressive palliative chemotherapy and is full code, but he is amenable to palliative following for symptom management -  Palliative care consult -  Will notify Dr. [name] of admission, although he is not managing cancer currently  Right pneumothorax likely secondary to ongoing pleural leak which is probably from malignancy.  This is recurrent and I am concerned about his ability to heal this leak spontaneously.  -  Agree with pleurex to suction -  Management per pulmonology  Fever with tachycardia = SIRS and possible PNA on CT, although this may just be persistent ateletasis.  Other sources may include urine or SBP.  Also consider malignancy-associated fever. -  Blood cx -  F/u UA and Ucx -  F/u thoracentesis Cx -  F/u paracentesis Cx -  vanc and zosyn pending results above  DOE, likely secondary to malignant effusion and PTX -  Appears dehydrated currently so will not be aggressive with diuresis at this time -  Management as above  Abdominal pain, ddx includes SBP or pain from malignancy -   Paracentesis with gram stain, cell count, and culture -  Obviously has malignant component and already receiving tx so will not send for cytology -  Continue long-acting narcotic -  Increase oral dilaudid -  Add IV medication for breakthrough pain -  Consider increasing long-acting narcotic if needed  Diarrhea, may be from recent constipation and lactulose, delayed side effect of chemotherapy, from cancer, or infectious -  C. Diff -  Stool culture -  If C. Diff negative, consider imodium  Nausea and vomiting -  zofran with phenergan for breakthrough   + gap metabolic acidosis, may be mixed due to ketosis, lactic acidosis, and diarrhea, low suspicion for toxic ingestion  -  Repeat BMP  -  Hydrate  CAD, stable.  Continue atorvastatin and ASA, but continue to address utility of these medications  T2DM, stable.   -  A1c -  Hold oral meds -  Start low dose SSI  Diet:  regular Access:  Port IVF:  yes Proph:  lovenox pending paracentesis  Code Status: full Family Communication: patient and wife Disposition Plan: Admit to stepdown for now to monitor respiratory status, but may be able to quickly transition to telemetry as he looks better than his labs and story suggest.  Concerned he may have a sudden decline, however.    Time spent: 60 min Tinna Kolker [facility] Pager [phone]  If 7PM-7AM, please contact night-coverage [email or web address] Password [facility] 05/04/2013, 4:33 PM

## 2013-05-05 ENCOUNTER — Inpatient Hospital Stay (HOSPITAL_COMMUNITY): Payer: BC Managed Care – PPO

## 2013-05-05 DIAGNOSIS — J90 Pleural effusion, not elsewhere classified: Secondary | ICD-10-CM

## 2013-05-05 DIAGNOSIS — Z515 Encounter for palliative care: Secondary | ICD-10-CM

## 2013-05-05 DIAGNOSIS — E119 Type 2 diabetes mellitus without complications: Secondary | ICD-10-CM

## 2013-05-05 DIAGNOSIS — E43 Unspecified severe protein-calorie malnutrition: Secondary | ICD-10-CM | POA: Insufficient documentation

## 2013-05-05 LAB — GLUCOSE, CAPILLARY
GLUCOSE-CAPILLARY: 66 mg/dL — AB (ref 70–99)
GLUCOSE-CAPILLARY: 145 mg/dL — AB (ref 70–99)
GLUCOSE-CAPILLARY: 181 mg/dL — AB (ref 70–99)
Glucose-Capillary: 67 mg/dL — ABNORMAL LOW (ref 70–99)
Glucose-Capillary: 92 mg/dL (ref 70–99)
Glucose-Capillary: 112 mg/dL — ABNORMAL HIGH (ref 70–99)

## 2013-05-05 LAB — PATHOLOGIST SMEAR REVIEW

## 2013-05-05 LAB — HEMOGLOBIN A1C
Hgb A1c MFr Bld: 8 % — ABNORMAL HIGH (ref <5.7)
Mean Plasma Glucose: 183 mg/dL — ABNORMAL HIGH (ref <117)

## 2013-05-05 MED ORDER — MORPHINE SULFATE ER 30 MG PO TBCR
45.0000 mg | EXTENDED_RELEASE_TABLET | Freq: Two times a day (BID) | ORAL | Status: DC
  Administered 2013-05-05 – 2013-05-08 (×7): 45 mg via ORAL
  Filled 2013-05-05 (×16): qty 1

## 2013-05-05 MED ORDER — DEXTROSE 50 % IV SOLN: 50.0000 mL | Freq: Once | INTRAVENOUS | Status: AC | PRN

## 2013-05-05 MED ORDER — CELECOXIB 100 MG PO CAPS
100.0000 mg | ORAL_CAPSULE | Freq: Every day | ORAL | Status: DC
  Administered 2013-05-05 – 2013-05-08 (×4): 100 mg via ORAL
  Filled 2013-05-05 (×4): qty 1

## 2013-05-05 MED ORDER — DEXTROSE 50 % IV SOLN: 25.0000 mL | Freq: Once | INTRAVENOUS | Status: AC | PRN

## 2013-05-05 MED ORDER — ENSURE COMPLETE PO LIQD
237.0000 mL | Freq: Three times a day (TID) | ORAL | Status: DC
Start: 2013-05-05 — End: 2013-05-08
  Administered 2013-05-05 – 2013-05-08 (×9): 237 mL via ORAL

## 2013-05-05 MED ORDER — DICLOFENAC EPOLAMINE 1.3 % TD PTCH
1.0000 | MEDICATED_PATCH | Freq: Two times a day (BID) | TRANSDERMAL | Status: DC
  Administered 2013-05-05 – 2013-05-08 (×7): 1 via TRANSDERMAL
  Filled 2013-05-05 (×11): qty 1

## 2013-05-05 MED ORDER — ONDANSETRON HCL 4 MG/2ML IJ SOLN
4.0000 mg | Freq: Four times a day (QID) | INTRAMUSCULAR | Status: DC
  Administered 2013-05-05 – 2013-05-08 (×13): 4 mg via INTRAVENOUS
  Filled 2013-05-05 (×13): qty 2

## 2013-05-05 NOTE — Evaluation (Signed)
Physical Therapy Evaluation Patient Details Name: Jimmy Christensen MRN: [id number] DOB: 03-02-1956 Today's Date: 05/05/2013 Time: 4235-3614 PT Time Calculation (min): 36 min  PT Assessment / Plan / Recommendation History of Present Illness  admitted with abdominal pain, pancreatic cancer.  Clinical Impression  Pt ambulated in room with hand hold. Should progress well. Pt will benefit from PT to address problems listed.    PT Assessment  Patient needs continued PT services    Follow Up Recommendations  Home health PT (may not require)    Does the patient have the potential to tolerate intense rehabilitation      Barriers to Discharge        Equipment Recommendations  None recommended by PT    Recommendations for Other Services     Frequency Min 3X/week    Precautions / Restrictions Precautions Precautions: Fall Precaution Comments: has CT Restrictions Weight Bearing Restrictions: No   Pertinent Vitals/Pain 102-133 HR sats > 94% 2l.  Pain in abd  When sitting up.      Mobility  Bed Mobility Overal bed mobility: Needs Assistance Bed Mobility: Supine to Sit Supine to sit: Min assist General bed mobility comments: assist for trunk Transfers Overall transfer level: Needs assistance Equipment used: 2 person hand held assist Transfers: Sit to/from Stand Sit to Stand: Min assist General transfer comment: +1 sit to stand; +2 for ambulation Ambulation/Gait Ambulation/Gait assistance: +2 safety/equipment Ambulation Distance (Feet): 30 Feet Assistive device: 1 person hand held assist (+ 1 for lines/tubes) Gait Pattern/deviations: Step-through pattern    Exercises     PT Diagnosis: Difficulty walking;Acute pain  PT Problem List: Decreased strength;Decreased activity tolerance;Pain;Decreased knowledge of precautions;Decreased knowledge of use of DME PT Treatment Interventions: DME instruction;Gait training;Functional mobility training;Therapeutic activities;Patient/family  education     PT Goals(Current goals can be found in the care plan section) Acute Rehab PT Goals Patient Stated Goal: none stated PT Goal Formulation: With patient Time For Goal Achievement: 05/19/13 Potential to Achieve Goals: Good  Visit Information  Last PT Received On: 05/05/13 Assistance Needed: +2 (ambulation) PT/OT/SLP Co-Evaluation/Treatment: Yes Reason for Co-Treatment: For patient/therapist safety PT goals addressed during session: Mobility/safety with mobility OT goals addressed during session: ADL's and self-care History of Present Illness: admitted with abdominal pain, pancreatic cancer.       Prior [place] expects to be discharged to:: Private residence Living Arrangements: Spouse/significant other Available Help at Discharge: Family Type of Home: House Home Access: Stairs to enter [employer] of Steps: 1 plus ledge Home Layout: Two level;Able to live on main level with bedroom/bathroom Home Equipment: None Additional Comments: has been sleeping on couch:  only place he can get comfortable Prior Function Level of Independence: Independent Communication Communication: No difficulties    Cognition  Cognition Arousal/Alertness: Awake/alert Behavior During Therapy: WFL for tasks assessed/performed Overall Cognitive Status: Within Functional Limits for tasks assessed    Extremity/Trunk Assessment Upper Extremity Assessment Upper Extremity Assessment: Defer to OT evaluation Lower Extremity Assessment Lower Extremity Assessment: Overall WFL for tasks assessed Cervical / Trunk Assessment Cervical / Trunk Assessment: Normal   Balance Balance Overall balance assessment: Needs assistance Sitting-balance support: No upper extremity supported Sitting balance-Leahy Scale: Fair  End of Session PT - End of Session Activity Tolerance: Patient tolerated treatment well Patient left: in bed;with call bell/phone within  reach;with family/visitor present  GP     Claretha Cooper 05/05/2013, 10:37 AM Tresa Endo PT [phone]

## 2013-05-05 NOTE — Progress Notes (Signed)
[facility] PROGRESS NOTE  Equan Cogbill IWL:[id number] DOB: 06-02-1955 DOA: 05/04/2013 PCP: [name], MD  Assessment/Plan  58 yo M with metastatic pancreatic cancer who presents with malignant pleural effusions, persistent PTX, new abdominal ascites with worsening abdominal pain. Patient would still like aggressive palliative chemotherapy and is full code, but he is amenable to palliative following for symptom management  - Palliative care consult  - Will notify Dr. [name] of admission, although he is not managing cancer currently   Right pneumothorax likely secondary to ongoing pleural leak which is probably from malignancy. This is recurrent and I am concerned about his ability to heal this leak spontaneously.  - Agree with pleurex to suction  - Called by radiology this morning that PTX is larger, however, patient appears stable, spoke with [name] who will follow up - Management per pulmonology   Fever with tachycardia = SIRS and possible PNA on CT, although this may just be persistent ateletasis. Other sources may include urine or SBP. Also consider malignancy-associated fever.  - Blood cx pending - UA neg  - thoracentesis Cx pending - F/u paracentesis Cx  - vanc and zosyn pending results above   DOE, likely secondary to malignant effusion and PTX  - Appreciate pulmonology assistance  Abdominal pain, ddx includes SBP or pain from malignancy, pain uncontrolled  - Paracentesis with gram stain, cell count, and culture  - Obviously has malignant component and already receiving tx so will not send for cytology  - Increase MS contin  - Continue increased oral dilaudid today - Continue IV medication for breakthrough pain   Diarrhea, may be from recent constipation and lactulose, delayed side effect of chemotherapy, from cancer, or infectious.  None since admission - C. Diff >> okay to d/c order if no BM in 48h - Stool culture  - If C. Diff negative, consider imodium    Nausea and vomiting, persistent - schedule zofran  - phenergan for breakthrough   + gap metabolic acidosis, may be mixed due to ketosis, lactic acidosis, and diarrhea, low suspicion for toxic ingestion  - Repeat BMP after IVF demonstrated improvement - continue gentle hydration  CAD, stable. Continue atorvastatin and ASA, but continue to address utility of these medications   T2DM, stable.  - A1c 8 - Hold oral meds  - Start low dose SSI  -  Continue lantus  Diet: regular  Access: Port  IVF: yes  Proph: lovenox pending paracentesis   Code Status: full  Family Communication: patient and wife  Disposition Plan:   Continue stepdown for now, but once PTX has been readdressed, consider transfer to telemetry  Consultants:  PCCM  Procedures:  CXR  CT abd/pelvis  Antibiotics:  vanc 1/19 >>  Zosyn 1/19 >>   HPI/Subjective:  STates he feels about the same as yesterday afternoon.  Still has SOB, but stable.  Abdominal pain is still severe.  No diarrhea.  + nausea.    Objective: Filed Vitals:   05/05/13 0200 05/05/13 0300 05/05/13 0400 05/05/13 0500  BP: 105/58 124/62 106/59 104/54  Pulse: 99 100 99 96  Temp:   98.1 F (36.7 C)   TempSrc:   Oral   Resp: 14 17 16 15   Height:      Weight:   85.5 kg (188 lb 7.9 oz)   SpO2: 95% 96% 97% 94%    Intake/Output Summary (Last 24 hours) at 05/05/13 0731 Last data filed at 05/05/13 0300  Gross per 24 hour  Intake 1470.83 ml  Output    825 ml  Net 645.83 ml   Filed Weights   05/04/13 1619 05/05/13 0400  Weight: 84.1 kg (185 lb 6.5 oz) 85.5 kg (188 lb 7.9 oz)    Exam:   General:  CM, No acute distress  HEENT:  NCAT, MMM  Cardiovascular:  RRR, nl S1, S2 no mrg, 2+ pulses, warm extremities  Respiratory:  Diminished on right side and left base, no rales or rhonchi or wheeze, no increased WOB.  Pleurex to suction, no bubbles leaking through currently, serosang fluid   Abdomen:   NABS, soft, mildly distended.   TTP with guarding LUQ and moderate TTP RLQ  MSK:   Normal tone and bulk, no LEE  Neuro:  Grossly intact  Data Reviewed: Basic Metabolic Panel:  Recent Labs Lab 05/04/13 1135 05/04/13 1820  NA 132* 131*  K 4.2 4.4  CL 93* 90*  CO2 17* 25  GLUCOSE 157* 155*  BUN 17 16  CREATININE 0.61 0.65  CALCIUM 8.4 8.3*   Liver Function Tests:  Recent Labs Lab 05/04/13 1135  AST 28  ALT 24  ALKPHOS 75  BILITOT 0.6  PROT 5.2*  ALBUMIN 2.0*    Recent Labs Lab 05/04/13 1135  LIPASE 8*   No results found for this basename: AMMONIA,  in the last 168 hours CBC:  Recent Labs Lab 05/02/13 1130 05/04/13 1135  WBC 5.8 4.0  NEUTROABS  --  3.5  HGB 11.7* 15.5  HCT 35.1* 44.7  MCV 86.2 84.2  PLT 208 216   Cardiac Enzymes:  Recent Labs Lab 05/04/13 1135  TROPONINI <0.30   BNP (last 3 results)  Recent Labs  05/04/13 1135  PROBNP 542.0*   CBG:  Recent Labs Lab 05/04/13 1123 05/04/13 1724 05/04/13 2119  GLUCAP 145* 152* 121*    Recent Results (from the past 240 hour(s))  BODY FLUID CULTURE     Status: None   Collection Time    05/04/13  2:08 PM      Result Value Range Status   Specimen Description PLEURAL   Final   Special Requests Immunocompromised   Final   Gram Stain     Final   Value: CYTOSPIN FEW WBC PRESENT,BOTH PMN AND MONONUCLEAR     NO ORGANISMS SEEN     Performed by [facility] Gram Stain Report Called to,Read Back By and Verified With: Gram Stain Report Called to,Read Back By and Verified With:  Burman Nieves RN AT 5956 ON01/19/15 BY A NAVARRO     Performed at [facility]   Culture PENDING   Incomplete   Report Status PENDING   Incomplete  GRAM STAIN     Status: None   Collection Time    05/04/13  2:08 PM      Result Value Range Status   Specimen Description PLEURAL   Final   Special Requests Immunocompromised   Final   Gram Stain     Final   Value: CYTOSPIN     FEW WBC PRESENT,BOTH PMN AND MONONUCLEAR     NO ORGANISMS SEEN      Gram Stain Report Called to,Read Back By and Verified WithBurman Nieves RN [phone] 05/04/13 A NAVARRO   Report Status 05/04/2013 FINAL   Final  MRSA PCR SCREENING     Status: None   Collection Time    05/04/13  5:23 PM      Result Value Range Status   MRSA by PCR NEGATIVE  NEGATIVE Final  Comment:            The GeneXpert MRSA Assay (FDA     approved for NASAL specimens     only), is one component of a     comprehensive MRSA colonization     surveillance program. It is not     intended to diagnose MRSA     infection nor to guide or     monitor treatment for     MRSA infections.     Studies: Dg Chest 1 View  05/04/2013   CLINICAL DATA:  Evaluate pneumothorax, shortness of breath and chest pain  EXAM: CHEST - 1 VIEW  COMPARISON:  Earlier same day; CT abdomen pelvis - earlier same day; 03/19/2013  FINDINGS: Grossly unchanged cardiac silhouette and mediastinal contours. Stable positioning of support apparatus. There has been minimal re-expansion of the right lung with persistent small right-sided hydro pneumothorax. No significant deviation of the cardiomediastinal structures. Possible minimal increase in the amount of right-sided pleural fluid. Small amount of left-sided pleural fluid appear grossly unchanged. Improved aeration of the right lower lung with persistent bibasilar heterogeneous opacities, right greater than left. No new focal airspace opacities. No definite evidence of edema. Grossly unchanged bones.  IMPRESSION: 1. Minimal re-expansion of the right lung with persistent small right-sided hydro pneumothorax. Given the presence of a right basilar approach chest tube, this pneumothorax may be ex vacuo in etiology. Clinical correlation is advised. 2. Unchanged small left-sided pleural effusion. 3. Improved aeration of the right lung with persistent bibasilar opacities, right greater than left, atelectasis versus infiltrate.   Electronically Signed   By: Sandi Mariscal M.D.   On: 05/04/2013 14:55    Ct Abdomen Pelvis W Contrast  05/04/2013   CLINICAL DATA:  Abdominal pain. Fever. Chills. Undergoing chemotherapy for pancreatic carcinoma.  EXAM: CT ABDOMEN AND PELVIS WITH CONTRAST  TECHNIQUE: Multidetector CT imaging of the abdomen and pelvis was performed using the standard protocol following bolus administration of intravenous contrast.  CONTRAST:  136mL OMNIPAQUE IOHEXOL 300 MG/ML  SOLN  COMPARISON:  03/19/2013  FINDINGS: Right chest tube is now seen in place with a right basilar hydropneumothorax. Collapse or consolidation is seen in the right lower lung. New small to moderate left pleural effusion noted.  New moderate ascites is seen. Peritoneal enhancement is also noted, and omental caking is again demonstrated, consistent with peritoneal carcinomatosis. No evidence of bowel obstruction.  Hypovascular mass in the pancreatic body is again seen which currently measures 4.1 x 3.8 cm on image 69 this is not significant change since previous study.  Probable tiny sub-cm hepatic cysts are stable. No definite liver masses are identified. The spleen, and adrenal glands are normal in appearance. Tiny renal cyst remains stable and there is no evidence of renal masses or hydronephrosis. No lymphadenopathy identified no evidence focal inflammatory process or bowel obstruction. No suspicious bone lesions identified.  IMPRESSION: New ascites and findings consistent with increased peritoneal carcinomatosis.  Large right basilar hydropneumothorax with right chest tube in place.  Collapse or consolidation in right lower lung. Infection or neoplasm cannot be excluded.  New small to moderate left pleural effusion.  Stable 4 cm hypovascular mass the pancreatic body, consistent with known primary pancreatic carcinoma.   Electronically Signed   By: Earle Gell M.D.   On: 05/04/2013 14:52   Dg Chest Port 1 View  05/04/2013   CLINICAL DATA:  Shortness of breath.  EXAM: PORTABLE CHEST - 1 VIEW  COMPARISON:  None.  FINDINGS:  Right chest tube is noted of the right lower chest. A right pneumothorax is present. The pneumothorax is prominent and may be under tension. Atelectasis and effusion on the right noted. Port-A-Cath noted in good anatomic position. Small left pleural effusion. Mild left base atelectasis. Heart size normal.  IMPRESSION: 1. Prominent right pneumothorax with with probable tension. Right chest tube is noted over the right chest. 2. Bibasilar atelectasis with bilateral pleural effusions. Critical Value/emergent results were called by telephone at the time of interpretation on 05/04/2013 at 11:50 AM to Dr. Joseph Berkshire , who verbally acknowledged these results.   Electronically Signed   By: Marcello Moores  Register   On: 05/04/2013 11:50    Scheduled Meds: . aspirin  81 mg Oral Daily  . atorvastatin  40 mg Oral Daily  . Canagliflozin  1 tablet Oral Daily  . cholecalciferol  5,000 Units Oral Daily  . DULoxetine  60 mg Oral Daily  . enoxaparin (LOVENOX) injection  40 mg Subcutaneous Q24H  . insulin aspart  0-5 Units Subcutaneous QHS  . insulin aspart  0-9 Units Subcutaneous TID WC  . insulin glargine  10 Units Subcutaneous QHS  . lidocaine-prilocaine  1 application Topical Once  . meloxicam  7.5 mg Oral Daily  . metoprolol succinate  25 mg Oral Daily  . morphine  30 mg Oral Q12H  . pantoprazole  40 mg Oral Daily  . piperacillin-tazobactam (ZOSYN)  IV  3.375 g Intravenous Q8H  . sodium chloride  3 mL Intravenous Q12H  . vancomycin  1,000 mg Intravenous Q8H   Continuous Infusions: . sodium chloride 50 mL/hr (05/05/13 0300)    Principal Problem:   Pneumothorax on right Active Problems:   CAD (coronary artery disease)   Pancreatic cancer with malignant pleural effusions and ascites   Metabolic acidosis, increased anion gap   Hyponatremia   Diabetes mellitus type 2 in nonobese   HCAP (healthcare-associated pneumonia)   Sepsis   Abdominal pain   Diarrhea    Time spent: 30 min    Brason Berthelot,  [facility] Pager [phone]. If 7PM-7AM, please contact night-coverage at [email or web address], password [facility] 05/05/2013, 7:31 AM  LOS: 1 day

## 2013-05-05 NOTE — Progress Notes (Signed)
CARE MANAGEMENT NOTE 05/05/2013  Patient:  Jimmy Christensen, Jimmy Christensen   Account Number:  [id number]  Date Initiated:  05/05/2013  Documentation initiated by:  Roch Quach  Subjective/Objective Assessment:   pt with hx of pancreatitis and liver ca now with enlarging pneumothorax and hypotension.     Action/Plan:   pt is amenable to palliative care consult but still wants aggressive care.   Anticipated DC Date:  05/08/2013   Anticipated DC Plan:  HOME W HOSPICE CARE  In-house referral  NA      DC Planning Services  NA      [facility] Choice  NA   Choice offered to / List presented to:  NA   DME arranged  NA      DME agency  NA     [place] arranged  NA      [place] agency  NA   Status of service:  In process, will continue to follow Medicare Important Message given?  NA - LOS <3 / Initial given by admissions (If response is "NO", the following Medicare IM given date fields will be blank) Date Medicare IM given:   Date Additional Medicare IM given:    Discharge Disposition:    Per UR Regulation:  Reviewed for med. necessity/level of care/duration of stay  If discussed at [place] of Stay Meetings, dates discussed:    Comments:  [id number]/Idonia Zollinger Eldridge Dace, [place], [place] [phone] Chart Reviewed for discharge and hospital needs. Discharge needs at time of review:  None present will follow for needs.  may need home hoispice. Review of patient progress due on [id number].

## 2013-05-05 NOTE — Progress Notes (Addendum)
Inpatient Diabetes Program Recommendations  AACE/ADA: New Consensus Statement on Inpatient Glycemic Control (2013)  Target Ranges:  Prepandial:   less than 140 mg/dL      Peak postprandial:   less than 180 mg/dL (1-2 hours)      Critically ill patients:  140 - 180 mg/dL   Reason for Visit: Results for SANKALP, FERRELL (MRN [id number]) as of 05/05/2013 14:44  Ref. Range 05/05/2013 05:28 05/05/2013 08:27 05/05/2013 08:50 05/05/2013 09:52 05/05/2013 11:58  Glucose-Capillary Latest Range: 70-99 mg/dL  66 (L) 67 (L) 92 112 (H)   Note patient received Lantus 10 units last PM.  Agree with discontinuation of Lantus and continue to monitor.   Thanks, Adah Perl, RN, BC-ADM Inpatient Diabetes Coordinator Pager [phone]

## 2013-05-05 NOTE — Evaluation (Addendum)
Occupational Therapy Evaluation Patient Details Name: Jimmy Christensen MRN: [id number] DOB: Mar 30, 1956 Today's Date: 05/05/2013 Time: 7616-0737 OT Time Calculation (min): 42 min  OT Assessment / Plan / Recommendation History of present illness  Pt was admitted for pneumothorax on R and has CT.  H/o metastatic pancreatic CA; he is undergoing chemo   Clinical Impression   Pt was admitted for the above.  He has been independent with adls up until a couple of days prior to admission.  Pt is deconditioned, has CT and pain which limit adls.  Will follow in acute for energy conservation education, strengthening, and to further assess bathroom DME.      OT Assessment  Patient needs continued OT Services    Follow Up Recommendations  No OT follow up;Supervision/Assistance - 24 hour    Barriers to Discharge      Equipment Recommendations   (further assess for 3:1)    Recommendations for Other Services    Frequency  Min 2X/week    Precautions / Restrictions Precautions Precautions: Fall Precaution Comments: has CT Restrictions Weight Bearing Restrictions: No Fall  Pertinent Vitals/Pain HR 108-133; sats 93 - 100% on 2 liters.  Had back and abdominal pain which was not rated.  Repositioned in bed.    ADL  Grooming: Supervision/safety Where Assessed - Grooming: Supported standing Upper Body Bathing: Supervision/safety (multiple lines) Where Assessed - Upper Body Bathing: Unsupported sitting Lower Body Bathing: Moderate assistance Where Assessed - Lower Body Bathing: Unsupported sit to stand Upper Body Dressing: Moderate assistance (multiple lines) Where Assessed - Upper Body Dressing: Unsupported sitting Lower Body Dressing: Maximal assistance Where Assessed - Lower Body Dressing: Unsupported sit to stand Toilet Transfer: Minimal assistance (of 2 for lines) Toilet Transfer Method: Sit to stand Toileting - Clothing Manipulation and Hygiene: Minimal assistance Where Assessed - Toileting  Clothing Manipulation and Hygiene: Sit to stand from 3-in-1 or toilet Transfers/Ambulation Related to ADLs: ambulated to bathroom:  +2 (min a) due to multiple lines and chest tube ADL Comments: adls limited by back pain at time of eval    OT Diagnosis: Generalized weakness  OT Problem List: Decreased strength;Decreased activity tolerance;Pain;Decreased knowledge of use of DME or AE OT Treatment Interventions: Self-care/ADL training;Balance training;Patient/family education;Energy conservation;DME and/or AE instruction   OT Goals(Current goals can be found in the care plan section) Acute Rehab OT Goals Patient Stated Goal: none stated OT Goal Formulation: With patient Time For Goal Achievement: 05/26/13 Potential to Achieve Goals: Good ADL Goals Pt Will Transfer to Toilet: with supervision;ambulating;regular height toilet;bedside commode Pt Will Perform Tub/Shower Transfer: with supervision;Shower transfer;ambulating Additional ADL Goal #1: pt will verbalize 3 energy conservation strategies  Visit Information  Last OT Received On: 05/05/13 Assistance Needed: +2 (ambulation) PT/OT/SLP Co-Evaluation/Treatment: Yes OT goals addressed during session: ADL's and self-care       Prior [place] expects to be discharged to:: Private residence Living Arrangements: Spouse/significant other Available Help at Discharge: Family Type of Home: House Home Access: Stairs to enter Technical brewer of Steps: 1 plus ledge Home Layout: Two level Home Equipment: None Additional Comments: has been sleeping on couch:  only place he can get comfortable Prior Function Level of Independence: Independent Communication Communication: No difficulties         Vision/Perception     Cognition  Cognition Arousal/Alertness: Awake/alert Behavior During Therapy: WFL for tasks assessed/performed Overall Cognitive Status: Within Functional Limits for tasks  assessed    Extremity/Trunk Assessment Upper Extremity Assessment Upper  Extremity Assessment: Overall WFL for tasks assessed (to 90.  has CT)     Mobility Bed Mobility Overal bed mobility: Needs Assistance Bed Mobility: Supine to Sit Supine to sit: Min assist General bed mobility comments: assist for trunk Transfers Overall transfer level: Needs assistance Equipment used: 2 person hand held assist (assist x 2 for lines) Transfers: Sit to/from Stand Sit to Stand: Min assist (to from 3:1 over toilet) General transfer comment: +1 sit to stand; +2 for ambulation     Exercise     Balance     End of Session OT - End of Session Activity Tolerance: Patient tolerated treatment well Patient left: in bed;with call bell/phone within reach  [place] 05/05/2013, 9:21 AM Lesle Chris, OTR/L [phone] 05/05/2013

## 2013-05-05 NOTE — Consult Note (Signed)
Palliative Medicine Team at [facility]  Date: 05/05/2013   Patient Name: Jimmy Christensen  DOB: Oct 11, 1955  MRN: [id number]  Age / Sex: 58 y.o., male   PCP: Delilah Shan, MD Referring Physician: Janece Canterbury, MD  HPI/Reason for Consultation: 58 yo with pancreatic cancer being treated at [facility] in [place], [place]. Admitted with large malignant effusion-s/p pleurx- worsening on CXR. PMT asked to establish goals of care and to discuss advance directives.  Clinically he is alert and talkative, he has good insight into his condition, He has been seeing a Pain Specialist at [facility]- severe upper back pain, right scapular pain, and lower extremity neuropathy. No records available on chart for review.  Participants in Discussion: Patient and his wife  Goals/Summary of Discussion:  1. Code Status:  LIMITED CODE, no intubation or CPR, but they desire aggressive treatment of reversible conditions.  2. Scope of Treatment:  Full scope aggressive treatment with added layer of palliative symptom management.  3. Assessment/Plan:  Primary Diagnoses  1. Pancreatic Cancer, metastatic  Prognosis: Terminal, <6 months-probably much less given this effusion and probable lung involvement    Active Symptoms 1. Pain, mostly upper back/right scapula-palpated trigger point, abdominal pain 2. Dyspnea, secondary to effusion  Recommendations:  Code status changed to LIMITED  Pulmonary following CT, Dyspnea issues  Right scapula pain-may be a trigger point in addition to referred pain from malignancy  Place Flector Patch on Right Scapula  May consider a local lidocaine/steroid injection into this region  Changed Mobic to Celebrex  Continue prn dosing of hydromorphone in addition to basal MScontin   4. Palliative Prophylaxis:  PRN bowel regimen.  5. Psychosocial Spiritual Asssessment/Interventions:  Patient and Family Adjustment to Illness/Prognosis: Coping well, wife tearful  but they both express hope for more time and say that they are still in this fight.  Spiritual Concerns or Needs: none expressed  6. Disposition: Patient and wife hopeful to discharge back home once stable and return to [facility] for next treatment phase.  Social History:   reports that he has quit smoking. His smoking use included Cigarettes. He has a 25 pack-year smoking history. He does not have any smokeless tobacco history on file. He reports that he drinks alcohol. He reports that he does not use illicit drugs.  Family History: Family History  Problem Relation Age of Onset  . Liver cancer Sister   . Cancer Paternal Grandmother   . Heart disease Mother   . Heart disease Father   . Stroke Sister   . Stroke Paternal Grandfather     Active Medications:  Outpatient medications: Prescriptions prior to admission  Medication Sig Dispense Refill  . aspirin 81 MG EC tablet Take 81 mg by mouth daily.        Marland Kitchen atorvastatin (LIPITOR) 40 MG tablet Take 1 tablet (40 mg total) by mouth daily.  90 tablet  3  . Canagliflozin (INVOKANA) 100 MG TABS Take 1 tablet by mouth daily.      . cholecalciferol (VITAMIN D) 1000 UNITS tablet Take 5,000 Units by mouth daily.      . DULoxetine (CYMBALTA) 60 MG capsule Take 60 mg by mouth daily.      . fluticasone (FLONASE) 50 MCG/ACT nasal spray Place 1 spray into both nostrils daily as needed.       . Glutamine POWD Take 10 g by mouth daily.      Marland Kitchen HYDROmorphone (DILAUDID) 2 MG tablet Take by mouth every 6 (six) hours  as needed for severe pain.      Marland Kitchen insulin glargine (LANTUS) 100 UNIT/ML injection Inject 10 Units into the skin at bedtime.       Marland Kitchen lactulose (CHRONULAC) 10 GM/15ML solution Take 10 g by mouth 2 (two) times daily.      Marland Kitchen lidocaine-prilocaine (EMLA) cream Apply 1 application topically once.      . meloxicam (MOBIC) 7.5 MG tablet Take 7.5 mg by mouth daily.      . metFORMIN (GLUCOPHAGE) 500 MG tablet Take 1 tablet by mouth daily.      . metoprolol  succinate (TOPROL-XL) 25 MG 24 hr tablet Take 1 tablet (25 mg total) by mouth daily.  90 tablet  3  . morphine (MS CONTIN) 30 MG 12 hr tablet Take 30 mg by mouth every 12 (twelve) hours.      . niacin (NIASPAN) 1000 MG CR tablet Take 1 tablet (1,000 mg total) by mouth at bedtime.  90 tablet  3  . ondansetron (ZOFRAN) 4 MG tablet Take 4 mg by mouth every 8 (eight) hours as needed for nausea or vomiting.      . pantoprazole (PROTONIX) 40 MG tablet Take 1 tablet (40 mg total) by mouth daily.  90 tablet  1  . prochlorperazine (COMPAZINE) 5 MG tablet Take 5 mg by mouth every 6 (six) hours as needed for nausea or vomiting.      . Zinc 10 MG LOZG Use as directed 10 mg in the mouth or throat daily.      . nitroGLYCERIN (NITROSTAT) 0.4 MG SL tablet Place 1 tablet (0.4 mg total) under the tongue every 5 (five) minutes as needed. May repeat up to 3 doses.  25 tablet  11  . senna-docusate (SENOKOT-S) 8.6-50 MG per tablet Take 2 tablets by mouth daily.  30 tablet  0    Current medications: Infusions: . sodium chloride 50 mL/hr (05/05/13 0300)    Scheduled Medications: . aspirin  81 mg Oral Daily  . atorvastatin  40 mg Oral Daily  . Canagliflozin  1 tablet Oral Daily  . celecoxib  100 mg Oral Daily  . cholecalciferol  5,000 Units Oral Daily  . diclofenac  1 patch Transdermal BID  . DULoxetine  60 mg Oral Daily  . enoxaparin (LOVENOX) injection  40 mg Subcutaneous Q24H  . insulin aspart  0-5 Units Subcutaneous QHS  . insulin aspart  0-9 Units Subcutaneous TID WC  . lidocaine-prilocaine  1 application Topical Once  . metoprolol succinate  25 mg Oral Daily  . morphine  45 mg Oral Q12H  . ondansetron (ZOFRAN) IV  4 mg Intravenous Q6H  . pantoprazole  40 mg Oral Daily  . piperacillin-tazobactam (ZOSYN)  IV  3.375 g Intravenous Q8H  . sodium chloride  3 mL Intravenous Q12H  . vancomycin  1,000 mg Intravenous Q8H    PRN Medications: acetaminophen, acetaminophen, alum & mag hydroxide-simeth,  antiseptic oral rinse, bisacodyl, dextrose, dextrose, fluticasone, hydrocortisone, HYDROmorphone (DILAUDID) injection, HYDROmorphone, LORazepam, menthol-cetylpyridinium, nitroGLYCERIN, promethazine   Vital Signs: BP 117/69  Pulse 107  Temp(Src) 98.5 F (36.9 C) (Oral)  Resp 25  Ht 6\' 2"  (1.88 m)  Wt 85.5 kg (188 lb 7.9 oz)  BMI 24.19 kg/m2  SpO2 98%   Physical Exam:  General appearance: mild distress, NAD Eyes: anicteric sclerae, moist conjunctivae; no lid-lag; PERRLA HENT: Poor dentition. Atraumatic; oropharynx clear with moist mucous membranes and no mucosal ulcerations; normal hard and soft palate Neck: Trachea midline; FROM, supple, no  thyromegaly or lymphadenopathy Lungs: Right CT, decrease breath sounds CV: RRR, no MRGs  Abdomen: Mildly tender Extremities: trace peripheral edema  Skin: Normal temperature, turgor and texture; no rash, ulcers or subcutaneous nodules Psych: Appropriate affect, alert and oriented to person, place and time Point tenderness in his upper back, trigger point located and muscle spasm and edema palpated in this area.  Labs:  Basic or Comprehensive Metabolic Panel:    Component Value Date/Time   NA 131* 05/04/2013 1820   NA 139 04/02/2013 1040   K 4.4 05/04/2013 1820   K 4.2 04/02/2013 1040   CL 90* 05/04/2013 1820   CO2 25 05/04/2013 1820   CO2 26 04/02/2013 1040   BUN 16 05/04/2013 1820   BUN 9.0 04/02/2013 1040   CREATININE 0.65 05/04/2013 1820   CREATININE 0.7 04/02/2013 1040   GLUCOSE 155* 05/04/2013 1820   GLUCOSE 157* 04/02/2013 1040   CALCIUM 8.3* 05/04/2013 1820   CALCIUM 10.2 04/02/2013 1040   AST 28 05/04/2013 1135   AST 21 04/02/2013 1040   ALT 24 05/04/2013 1135   ALT 13 04/02/2013 1040   ALKPHOS 75 05/04/2013 1135   ALKPHOS 71 04/02/2013 1040   BILITOT 0.6 05/04/2013 1135   BILITOT 0.38 04/02/2013 1040   PROT 5.2* 05/04/2013 1135   PROT 7.3 04/02/2013 1040   ALBUMIN 2.0* 05/04/2013 1135   ALBUMIN 3.8 04/02/2013 1040     CBC:     Component Value Date/Time   WBC 4.0 05/04/2013 1135   WBC 11.4* 04/02/2013 1040   HGB 15.5 05/04/2013 1135   HGB 16.0 04/02/2013 1040   HCT 44.7 05/04/2013 1135   HCT 48.2 04/02/2013 1040   PLT 216 05/04/2013 1135   PLT 309 04/02/2013 1040   MCV 84.2 05/04/2013 1135   MCV 89.6 04/02/2013 1040   NEUTROABS 3.5 05/04/2013 1135   NEUTROABS 9.0* 04/02/2013 1040   LYMPHSABS 0.4* 05/04/2013 1135   LYMPHSABS 1.3 04/02/2013 1040   MONOABS 0.1 05/04/2013 1135   MONOABS 1.0* 04/02/2013 1040   EOSABS 0.0 05/04/2013 1135   EOSABS 0.1 04/02/2013 1040   BASOSABS 0.0 05/04/2013 1135   BASOSABS 0.0 04/02/2013 1040     BNP (last 3 results)  Recent Labs  05/04/13 1135  PROBNP 542.0*    CBG (last 3)   Recent Labs  05/04/13 2119 05/05/13 0827 05/05/13 0850  GLUCAP 121* 66* 67*    Imaging:  Dg Chest 1 View  05/04/2013   CLINICAL DATA:  Evaluate pneumothorax, shortness of breath and chest pain  EXAM: CHEST - 1 VIEW  COMPARISON:  Earlier same day; CT abdomen pelvis - earlier same day; 03/19/2013  FINDINGS: Grossly unchanged cardiac silhouette and mediastinal contours. Stable positioning of support apparatus. There has been minimal re-expansion of the right lung with persistent small right-sided hydro pneumothorax. No significant deviation of the cardiomediastinal structures. Possible minimal increase in the amount of right-sided pleural fluid. Small amount of left-sided pleural fluid appear grossly unchanged. Improved aeration of the right lower lung with persistent bibasilar heterogeneous opacities, right greater than left. No new focal airspace opacities. No definite evidence of edema. Grossly unchanged bones.  IMPRESSION: 1. Minimal re-expansion of the right lung with persistent small right-sided hydro pneumothorax. Given the presence of a right basilar approach chest tube, this pneumothorax may be ex vacuo in etiology. Clinical correlation is advised. 2. Unchanged small left-sided pleural effusion. 3.  Improved aeration of the right lung with persistent bibasilar opacities, right greater than left, atelectasis versus infiltrate.  Electronically Signed   By: Sandi Mariscal M.D.   On: 05/04/2013 14:55   Ct Abdomen Pelvis W Contrast  05/04/2013   CLINICAL DATA:  Abdominal pain. Fever. Chills. Undergoing chemotherapy for pancreatic carcinoma.  EXAM: CT ABDOMEN AND PELVIS WITH CONTRAST  TECHNIQUE: Multidetector CT imaging of the abdomen and pelvis was performed using the standard protocol following bolus administration of intravenous contrast.  CONTRAST:  167mL OMNIPAQUE IOHEXOL 300 MG/ML  SOLN  COMPARISON:  03/19/2013  FINDINGS: Right chest tube is now seen in place with a right basilar hydropneumothorax. Collapse or consolidation is seen in the right lower lung. New small to moderate left pleural effusion noted.  New moderate ascites is seen. Peritoneal enhancement is also noted, and omental caking is again demonstrated, consistent with peritoneal carcinomatosis. No evidence of bowel obstruction.  Hypovascular mass in the pancreatic body is again seen which currently measures 4.1 x 3.8 cm on image 69 this is not significant change since previous study.  Probable tiny sub-cm hepatic cysts are stable. No definite liver masses are identified. The spleen, and adrenal glands are normal in appearance. Tiny renal cyst remains stable and there is no evidence of renal masses or hydronephrosis. No lymphadenopathy identified no evidence focal inflammatory process or bowel obstruction. No suspicious bone lesions identified.  IMPRESSION: New ascites and findings consistent with increased peritoneal carcinomatosis.  Large right basilar hydropneumothorax with right chest tube in place.  Collapse or consolidation in right lower lung. Infection or neoplasm cannot be excluded.  New small to moderate left pleural effusion.  Stable 4 cm hypovascular mass the pancreatic body, consistent with known primary pancreatic carcinoma.    Electronically Signed   By: Earle Gell M.D.   On: 05/04/2013 14:52   Dg Chest Port 1 View  05/05/2013   CLINICAL DATA:  Followup pneumothorax.  EXAM: PORTABLE CHEST - 1 VIEW  COMPARISON:  05/04/2013  FINDINGS: Right IJ central venous catheter is unchanged. Small caliber right-sided chest tube has tip over the medial right base unchanged. There is continued evidence of a moderate to large right-sided pneumothorax which has worsened compared to the recent prior exam from 05/04/2013 at 1443 although is not significantly changed from the prior exam 05/04/2013 11:28 a.m. There is continued hazy opacification over the right lower thorax likely layering pleural fluid slightly improved. There is mild worsening left perihilar and basilar opacification which may be due to interstitial edema. There is continued moderate consolidation over the inferior aspect of the collapsed right lung unchanged. Cardiomediastinal silhouette and remainder of the exam is unchanged.  IMPRESSION: Interval worsening of patient's moderate to large right-sided pneumothorax with small caliber chest tube in place and unchanged with tip over the medial right base.  Slightly improved right pleural fluid collection. Stable consolidation over the inferior aspect of the collapse right lung. Worsening left perihilar/ basilar opacification suggesting interstitial edema.  These results were called by telephone at the time of interpretation on 05/05/2013 at 7:31 AM to Dr. Janece Canterbury , who verbally acknowledged these results.   Electronically Signed   By: Marin Olp M.D.   On: 05/05/2013 07:31   Dg Chest Port 1 View  05/04/2013   CLINICAL DATA:  Shortness of breath.  EXAM: PORTABLE CHEST - 1 VIEW  COMPARISON:  None.  FINDINGS: Right chest tube is noted of the right lower chest. A right pneumothorax is present. The pneumothorax is prominent and may be under tension. Atelectasis and effusion on the right noted. Port-A-Cath noted in good  anatomic  position. Small left pleural effusion. Mild left base atelectasis. Heart size normal.  IMPRESSION: 1. Prominent right pneumothorax with with probable tension. Right chest tube is noted over the right chest. 2. Bibasilar atelectasis with bilateral pleural effusions. Critical Value/emergent results were called by telephone at the time of interpretation on 05/04/2013 at 11:50 AM to Dr. Joseph Berkshire , who verbally acknowledged these results.   Electronically Signed   By: [place]   On: 05/04/2013 11:50   Time: 70 minutes. Greater than 50%  of this time was spent counseling and coordinating care related to the above assessment and plan.  Signed by: Acquanetta Chain, DO  05/05/2013, 10:02 AM  Please contact Palliative Medicine Team phone at [phone] for questions and concerns.

## 2013-05-05 NOTE — Consult Note (Signed)
   Name: Jimmy Christensen MRN: [id number] DOB: 1956-04-02    ADMISSION DATE:  05/04/2013 CONSULTATION DATE:  1/19  REFERRING MD :  Jimmy Christensen  PRIMARY SERVICE:  [facility]   CHIEF COMPLAINT:  Dyspnea/ possible pneumothorax   BRIEF PATIENT DESCRIPTION:   This is a 58 year old male recently diagnosed w/ stage IV pancreatic cancer in December. He is receiving his chemotherapy at [facility] in [place] (last chemo 1/15, and recently had Pleurx tube placed on 1/14, drained last 1/18) . Presents to ER at [facility] on 1/19 with 1d h/o progressive abd pain, fever and associated dyspnea. On eval pt found to have large right PTX. Per EDP had sig rush of air when vac bottle attached. PCCM asked to eval to assist in management of PTX.   SIGNIFICANT EVENTS / STUDIES:  Last chemo 1/15  Right pleur-x tube placed 1/14 To ER 1/17 with constipation  1/19 presents w/ abd pain, but found to have large PTX on film. Placed Pleur-x to [facility]  1/20 CXR > pleural effusion gone, pneumothorax bigger  LINES / TUBES: Right pleur-x tube 1/14>>>  CULTURES:  ANTIBIOTICS:   SUBJECTIVE: His shortness of breath has improved quite a bit since yesterday   VITAL SIGNS: Temp:  [97.8 F (36.6 C)-98.5 F (36.9 C)] 98.5 F (36.9 C) (01/20 0800) Pulse Rate:  [96-136] 108 (01/20 1000) Resp:  [14-30] 25 (01/20 1000) BP: (97-124)/(48-69) 116/61 mmHg (01/20 1000) SpO2:  [93 %-99 %] 99 % (01/20 1000) Weight:  [84.1 kg (185 lb 6.5 oz)-85.5 kg (188 lb 7.9 oz)] 85.5 kg (188 lb 7.9 oz) (01/20 0400)  PHYSICAL EXAMINATION: General:  Comfortable but mildly tachypnic HEENT: NCAT, EOMi PULM: diminished on R, CTA on left AB: BS+, mildly tender Ext: warm, no edema Neuro: A&Ox4, MAEW   Recent Labs Lab 05/04/13 1135 05/04/13 1820  NA 132* 131*  K 4.2 4.4  CL 93* 90*  CO2 17* 25  BUN 17 16  CREATININE 0.61 0.65  GLUCOSE 157* 155*    Recent Labs Lab 05/02/13 1130 05/04/13 1135  HGB 11.7* 15.5  HCT 35.1* 44.7  WBC  5.8 4.0  PLT 208 216     ASSESSMENT / PLAN: Abd pain/fever in setting of known stage IV pancreatic cancer w/ mets to omentum, peritoneum and lymph. Undergoing chemotherapy at [facility].  Plan Per [facility]  Right Pneumothorax vs trapped lung  S/p pleur-x tube placed 1/14 for recurrent pleural effusion (possibly malignant). CXR this morning shows enlarged pneumothorax but no pleural effusion; classic for trapped lung, and will not improve with an additional pleural drainage catheter; I have discussed this with his pulmonologist from [facility] Karle Starch, cell [phone]) who feels that his lung will not re-expand with suction.   Plan: -change to water seal for 24 hours -repeat CXR in AM -resume pleurex drainage schedule 1/22  Jimmy Christensen PCCM Pager: [phone] Cell: [phone] If no response, call [phone]

## 2013-05-05 NOTE — Progress Notes (Signed)
INITIAL NUTRITION ASSESSMENT  Pt meets criteria for severe MALNUTRITION in the context of chronic illness as evidenced by 9% weight loss in the past month in addition to pt with severe muscle wasting and subcutaneous fat loss in clavicles.  DOCUMENTATION CODES Per approved criteria  -Severe malnutrition in the context of chronic illness   INTERVENTION: - Ensure Complete TID - Discussed strategies to help with nausea and changes in taste. Handouts provided with RD contact information.  - Will continue to monitor   NUTRITION DIAGNOSIS: Inadequate oral intake related to poor appetite as evidenced by 10% meal intake.   Goal: Pt to consume >90% of meals/supplements  Monitor:  Weights, labs, intake   Reason for Assessment: Malnutrition screening tool   58 y.o. male  Admitting Dx: Pneumothorax on right  ASSESSMENT: Pt with recently diagnosed w/ stage IV pancreatic cancer in December. He is receiving his chemotherapy at [facility] in [place] (last chemo 1/15, and recently had Pleurx tube placed on 1/14, drained last 1/18) . Presents to ER at [facility] on 1/19 with 1d h/o progressive abd pain, fever and associated dyspnea. On eval pt found to have large right pneumothorax. Per palliative care notes, pt with likely less than 6 months prognosis.   Met with pt and wife who report pt with nausea for the past 2 days and his home antiemetics were only helping slightly. Pt reports eating 3 meals/day at home and drinking 3 smoothies/day mixed with protein powder. Had diarrhea that started Saturday however denies any today. Denies any nausea/vomiting today. Was able to eat some fruit for breakfast. Provided pt with Ensure Complete. Pt and wife report at least 30 pound unintended weight loss since last month. Unable to perform physical exam as pt falling asleep. Noted pt with severe muscle wasting and subcutaneous fat loss in clavicle region and mild/moderate muscle wasting and subcutaneous fat  loss throughout body.   Sodium slightly low   Height: Ht Readings from Last 1 Encounters:  05/04/13 $RemoveB'6\' 2"'[id number]$  (1.88 m)    Weight: Wt Readings from Last 1 Encounters:  05/05/13 188 lb 7.9 oz (85.5 kg)    Ideal Body Weight: 190 lb   % Ideal Body Weight: 99%  Wt Readings from Last 10 Encounters:  05/05/13 188 lb 7.9 oz (85.5 kg)  05/02/13 188 lb (85.276 kg)  04/02/13 207 lb 8 oz (94.121 kg)  09/12/12 256 lb (116.121 kg)  08/08/11 266 lb (120.657 kg)  05/06/11 259 lb 14.8 oz (117.9 kg)  10/13/10 261 lb 12.8 oz (118.752 kg)  09/22/09 257 lb (116.574 kg)  09/22/08 241 lb (109.317 kg)    Usual Body Weight: 218 lb at least per wife  % Usual Body Weight: 86%  BMI:  Body mass index is 24.19 kg/(m^2).  Estimated Nutritional Needs: Kcal: 2200-2550 Protein: 105-130g Fluid: 2.2-2.5L/day  Skin: Intact   Diet Order: General  EDUCATION NEEDS: -Education needs addressed - discussed strategies to help with nausea and changes in taste. Handouts provided with RD contact information.    Intake/Output Summary (Last 24 hours) at 05/05/13 1147 Last data filed at 05/05/13 1100  Gross per 24 hour  Intake 2840.83 ml  Output   1950 ml  Net 890.83 ml    Last BM: 1/19  Labs:   Recent Labs Lab 05/04/13 1135 05/04/13 1820  NA 132* 131*  K 4.2 4.4  CL 93* 90*  CO2 17* 25  BUN 17 16  CREATININE 0.61 0.65  CALCIUM 8.4 8.3*  GLUCOSE  157* 155*    CBG (last 3)   Recent Labs  05/04/13 2119 05/05/13 0827 05/05/13 0850  GLUCAP 121* 66* 67*    Scheduled Meds: . aspirin  81 mg Oral Daily  . atorvastatin  40 mg Oral Daily  . Canagliflozin  1 tablet Oral Daily  . celecoxib  100 mg Oral Daily  . cholecalciferol  5,000 Units Oral Daily  . diclofenac  1 patch Transdermal BID  . DULoxetine  60 mg Oral Daily  . enoxaparin (LOVENOX) injection  40 mg Subcutaneous Q24H  . insulin aspart  0-5 Units Subcutaneous QHS  . insulin aspart  0-9 Units Subcutaneous TID WC  .  lidocaine-prilocaine  1 application Topical Once  . metoprolol succinate  25 mg Oral Daily  . morphine  45 mg Oral Q12H  . ondansetron (ZOFRAN) IV  4 mg Intravenous Q6H  . pantoprazole  40 mg Oral Daily  . piperacillin-tazobactam (ZOSYN)  IV  3.375 g Intravenous Q8H  . sodium chloride  3 mL Intravenous Q12H  . vancomycin  1,000 mg Intravenous Q8H    Continuous Infusions: . sodium chloride 50 mL/hr (05/05/13 0300)    Past Medical History  Diagnosis Date  . Overweight   . Dyslipidemia   . CAD (coronary artery disease)     [name] (DES) stent 2006; Nuclear stress 05/08, EF 62%, no ischemia  . Ejection fraction     50% catheterization 2006 /  60% nuclear, 2008  . MI (myocardial infarction)   . Kidney stones 1981  . Carotid artery disease     Doppler, May, 2013, 0-39% bilateral  . Ventral hernia     First noted may, 2014  . Pancreatic cancer   . Constipation   . Chemotherapy-induced nausea   . Pleural effusion on right   . Diabetes mellitus type 2 in nonobese   . Cataract     Past Surgical History  Procedure Laterality Date  . Cardiac catheterization  2006    EF 50%; 62% nuclear 2008  . Kidney stone surgery    . Ankle surgery  right  . Coronary angioplasty    . Insertion / placement pleural catheter      Mikey College MS, RD, LDN [phone] Pager [phone] After Hours Pager

## 2013-05-06 ENCOUNTER — Inpatient Hospital Stay (HOSPITAL_COMMUNITY): Payer: BC Managed Care – PPO

## 2013-05-06 DIAGNOSIS — M25519 Pain in unspecified shoulder: Secondary | ICD-10-CM

## 2013-05-06 DIAGNOSIS — R109 Unspecified abdominal pain: Secondary | ICD-10-CM

## 2013-05-06 DIAGNOSIS — C259 Malignant neoplasm of pancreas, unspecified: Secondary | ICD-10-CM

## 2013-05-06 LAB — CLOSTRIDIUM DIFFICILE BY PCR: Toxigenic C. Difficile by PCR: NEGATIVE

## 2013-05-06 LAB — GLUCOSE, CAPILLARY
Glucose-Capillary: 163 mg/dL — ABNORMAL HIGH (ref 70–99)
Glucose-Capillary: 180 mg/dL — ABNORMAL HIGH (ref 70–99)

## 2013-05-06 NOTE — Progress Notes (Addendum)
  [facility] PCCM Name: Kara Mierzejewski MRN: [id number] DOB: Aug 12, 1955    ADMISSION DATE:  05/04/2013 CONSULTATION DATE:  1/19  REFERRING MD :  Betsey Holiday  PRIMARY SERVICE:  [facility]   CHIEF COMPLAINT:  Dyspnea/ possible pneumothorax   BRIEF PATIENT DESCRIPTION:   This is a 58 year old male recently diagnosed w/ stage IV pancreatic cancer in December. He is receiving his chemotherapy at [facility] in [place] (last chemo 1/15, and recently had Pleurx tube placed on 1/14, drained last 1/18) . Presents to ER at [facility] on 1/19 with 1d h/o progressive abd pain, fever and associated dyspnea. On eval pt found to have large right PTX. Per EDP had sig rush of air when vac bottle attached. PCCM asked to eval to assist in management of PTX.   SIGNIFICANT EVENTS / STUDIES:  Last chemo 1/15  Right pleur-x tube placed 1/14 To ER 1/17 with constipation  1/19 presents w/ abd pain, but found to have large PTX on film. Placed Pleur-x to [facility]  1/20 CXR > pleural effusion gone, pneumothorax bigger 1/21 CXR > stable findings on right, perhaps left effusion bigger  LINES / TUBES: Right pleur-x tube 1/14>>>  CULTURES:  ANTIBIOTICS:   SUBJECTIVE: dyspnea stable, still has belly pain   VITAL SIGNS: Temp:  [97.5 F (36.4 C)-99.4 F (37.4 C)] 97.7 F (36.5 C) (01/21 0800) Pulse Rate:  [97-127] 110 (01/21 1000) Resp:  [10-20] 14 (01/21 1000) BP: (90-115)/(23-72) 99/23 mmHg (01/21 1000) SpO2:  [89 %-98 %] 98 % (01/21 1000) Weight:  [87.9 kg (193 lb 12.6 oz)] 87.9 kg (193 lb 12.6 oz) (01/21 0400)  PHYSICAL EXAMINATION: General:  Comfortable  HEENT: NCAT, EOMi PULM: diminished on R, CTA on left AB: BS+, mildly tender LUQ Ext: warm, no edema Neuro: A&Ox4, MAEW   Recent Labs Lab 05/04/13 1135 05/04/13 1820  NA 132* 131*  K 4.2 4.4  CL 93* 90*  CO2 17* 25  BUN 17 16  CREATININE 0.61 0.65  GLUCOSE 157* 155*    Recent Labs Lab 05/02/13 1130 05/04/13 1135  HGB 11.7* 15.5  HCT 35.1* 44.7   WBC 5.8 4.0  PLT 208 216     ASSESSMENT / PLAN: Abd pain/fever in setting of known stage IV pancreatic cancer w/ mets to omentum, peritoneum and lymph. Markedly tender LUQ but no clear acute pathology on CT 1/19 or on labs or exam; is this pain related to progressive disease?  Undergoing chemotherapy at [facility].  Plan Per [facility] Consider repeat imaging if exam gets worse Aggressive pain control  Right Pneumothorax vs trapped lung  S/p pleur-x tube placed 1/14 for recurrent paramalignant pleural effusion. He has trapped lung on the R and wall suction or further chest tubes will not change the appearance.  I have discussed this with his pulmonologist from [facility] Karle Starch, cell [phone]) who agrees  I am concerned about the left effusion.  Ddx sympathetic effusion vs transudate vs malignant;   I don't think he has pneumonia  Plan: -d/c [place] -resume every other day bottle drainage of R effusion (first 1/22) -Monitor left effusion with repeat CXR 1/22 AM -we need to be aggressive with left effusion (he is only really breathing on left lung) > if bigger or unchanged on 1/22 then needs to be tapped   Wife updated at bedside  [name] PCCM Pager: [phone] Cell: [phone] If no response, call [phone]

## 2013-05-06 NOTE — Progress Notes (Signed)
[facility] PROGRESS NOTE  Jimmy Christensen [id number] DOB: 1956-03-02 DOA: 05/04/2013 PCP: [name], MD  Assessment/Plan Metastatic pancreatic cancer -patient admitted with malignant pleural effusions, persistent PTX, new abdominal ascites with worsening abdominal pain. Patient would still like aggressive palliative chemotherapy and is full code, but he is amenable to palliative following for symptom management  -  Dr. [name] notify of admission per admitting M.D. - Appreciate palliative care assistance>> he wants to be a limited code, and was to continue aggressive treatments and planning to followup with his oncologist in [place] for continued chemotherapy, unless not able to travel in which case he would consider it came on here. Right pneumothorax vs Trapped Lung  likely secondary to ongoing pleural leak which is probably from malignancy-recurrent  - Pleurx tube placement 1/14 for recurrent pleural effusion - Chest x-ray on 1/20 with larger PTX  -Pleurx changed to waterseal for 24 hours>> repeat chest x-ray this a.m. 1/21 with persistent right pneumothorax chest tube in stable position. Partial clearing of the pulmonary infiltrate and developing left pleural effusion - Management per pulmonology-appreciate the assistance   Fever with tachycardia = SIRS, Also possible malignancy-associated fever.  - Blood cx with no growth to date - UA neg, and a urine culture was sent>> will order in for  -s/p  thoracentesis 1/19-studies so far with no evidence of infection - Followup chest x-ray shows infiltrate has cleared>> was most likely atelectasis - on empiric vanc and zosyn>> follow and if cultures remain negative we'll DC  -He has defervesced, follow.  DOE, likely secondary to malignant effusion and PTX  - Appreciate pulmonology assistance  Abdominal pain, ddx includes SBP or pain from malignancy>> more likely secondary to malignancy - no Paracentesis done as ultrasound is only small amount of  ascites not amenable for paracentesis - Obviously has malignant component and already receiving tx so will not send for cytology  - continue MS contin  - Continue prn oral/IVdilaudid  Diarrhea, may be from recent constipation and lactulose, delayed side effect of chemotherapy, from cancer, or infectious.  None since admission -States no further area, follow studies  Nausea and vomiting - Continue schedule zofran, prn  phenergan for breakthrough   + gap metabolic acidosis, may be mixed due to ketosis, lactic acidosis, and diarrhea - continue gentle hydration -Follow recheck the meds in a.m.  CAD, stable. Continue atorvastatin and ASA, but continue to address utility of these medications   T2DM, stable.  - A1c 8 - Hold oral meds  - Start low dose SSI  -  Continue lantus   Code Status: full  Family Communication: patient and wife  Disposition Plan:   Continue stepdown for now, but once PTX has been readdressed, consider transfer to telemetry  Consultants:  PCCM  Procedures:  CXR  CT abd/pelvis  Antibiotics:  vanc 1/19 >>  Zosyn 1/19 >>   HPI/Subjective:  States that the pain(left upper quadrant and lower abdominal) 3-4 with pain meds but seems to worsen with activity.    Objective: Filed Vitals:   05/06/13 0400 05/06/13 0500 05/06/13 0700 05/06/13 0800  BP: 94/65   90/64  Pulse: 107 107 108 110  Temp: 99.4 F (37.4 C)     TempSrc: Oral     Resp: 14 12 10 14   Height:      Weight: 87.9 kg (193 lb 12.6 oz)     SpO2: 98% 97% 89% 96%    Intake/Output Summary (Last 24 hours) at 05/06/13 [id number] Last data filed at  05/06/13 0803  Gross per 24 hour  Intake   2170 ml  Output   1905 ml  Net    265 ml   Filed Weights   05/04/13 1619 05/05/13 0400 05/06/13 0400  Weight: 84.1 kg (185 lb 6.5 oz) 85.5 kg (188 lb 7.9 oz) 87.9 kg (193 lb 12.6 oz)    Exam:   General:  Middle aged male, No acute distress  HEENT:  NCAT, MMM  Cardiovascular:  RRR, nl S1, S2 no  mrg, 2+ pulses, warm extremities  Respiratory: Decreased breath sounds at bases bilaterally, no rales or rhonchi or wheeze, no increased WOB.  Pleurex to suction.  Abdomen:soft, mildly distended.  Tenderness in LUQ and lower abdominal  Extremities: No cyanosis and no edema  Neuro:  Grossly intact  Data Reviewed: Basic Metabolic Panel:  Recent Labs Lab 05/04/13 1135 05/04/13 1820  NA 132* 131*  K 4.2 4.4  CL 93* 90*  CO2 17* 25  GLUCOSE 157* 155*  BUN 17 16  CREATININE 0.61 0.65  CALCIUM 8.4 8.3*   Liver Function Tests:  Recent Labs Lab 05/04/13 1135  AST 28  ALT 24  ALKPHOS 75  BILITOT 0.6  PROT 5.2*  ALBUMIN 2.0*    Recent Labs Lab 05/04/13 1135  LIPASE 8*   No results found for this basename: AMMONIA,  in the last 168 hours CBC:  Recent Labs Lab 05/02/13 1130 05/04/13 1135  WBC 5.8 4.0  NEUTROABS  --  3.5  HGB 11.7* 15.5  HCT 35.1* 44.7  MCV 86.2 84.2  PLT 208 216   Cardiac Enzymes:  Recent Labs Lab 05/04/13 1135  TROPONINI <0.30   BNP (last 3 results)  Recent Labs  05/04/13 1135  PROBNP 542.0*   CBG:  Recent Labs Lab 05/05/13 0952 05/05/13 1158 05/05/13 1622 05/05/13 2234 05/06/13 0740  GLUCAP 92 112* 145* 181* 163*    Recent Results (from the past 240 hour(s))  CULTURE, BLOOD (ROUTINE X 2)     Status: None   Collection Time    05/04/13 11:35 AM      Result Value Range Status   Specimen Description BLOOD LEFT HAND   Final   Special Requests BOTTLES DRAWN AEROBIC AND ANAEROBIC 3ML   Final   Culture  Setup Time     Final   Value: 05/04/2013 14:07     Performed at [facility]   Culture     Final   Value:        BLOOD CULTURE RECEIVED NO GROWTH TO DATE CULTURE WILL BE HELD FOR 5 DAYS BEFORE ISSUING A FINAL NEGATIVE REPORT     Performed at [facility]   Report Status PENDING   Incomplete  CULTURE, BLOOD (ROUTINE X 2)     Status: None   Collection Time    05/04/13 11:40 AM      Result Value Range  Status   Specimen Description BLOOD RIGHT HAND   Final   Special Requests BOTTLES DRAWN AEROBIC ONLY 3ML   Final   Culture  Setup Time     Final   Value: 05/04/2013 14:07     Performed at [facility]   Culture     Final   Value:        BLOOD CULTURE RECEIVED NO GROWTH TO DATE CULTURE WILL BE HELD FOR 5 DAYS BEFORE ISSUING A FINAL NEGATIVE REPORT     Performed at [facility]   Report Status PENDING  Incomplete  BODY FLUID CULTURE     Status: None   Collection Time    05/04/13  2:08 PM      Result Value Range Status   Specimen Description PLEURAL   Final   Special Requests Immunocompromised   Final   Gram Stain     Final   Value: CYTOSPIN FEW WBC PRESENT,BOTH PMN AND MONONUCLEAR     NO ORGANISMS SEEN     Performed by [facility] Gram Stain Report Called to,Read Back By and Verified With: Gram Stain Report Called to,Read Back By and Verified With:  J Maxie Better RN AT 5366 ON01/19/15 BY A NAVARRO     Performed at [facility]   Culture PENDING   Incomplete   Report Status PENDING   Incomplete  GRAM STAIN     Status: None   Collection Time    05/04/13  2:08 PM      Result Value Range Status   Specimen Description PLEURAL   Final   Special Requests Immunocompromised   Final   Gram Stain     Final   Value: CYTOSPIN     FEW WBC PRESENT,BOTH PMN AND MONONUCLEAR     NO ORGANISMS SEEN     Gram Stain Report Called to,Read Back By and Verified With: J BEAN RN [phone] 05/04/13 A NAVARRO   Report Status 05/04/2013 FINAL   Final  MRSA PCR SCREENING     Status: None   Collection Time    05/04/13  5:23 PM      Result Value Range Status   MRSA by PCR NEGATIVE  NEGATIVE Final   Comment:            The GeneXpert MRSA Assay (FDA     approved for NASAL specimens     only), is one component of a     comprehensive MRSA colonization     surveillance program. It is not     intended to diagnose MRSA     infection nor to guide or     monitor treatment for     MRSA  infections.  STOOL CULTURE     Status: None   Collection Time    05/06/13 12:36 AM      Result Value Range Status   Specimen Description STOOL   Final   Special Requests Normal   Final   Culture     Final   Value: Culture reincubated for better growth     Performed at [facility]   Report Status PENDING   Incomplete     Studies: Dg Chest 1 View  05/04/2013   CLINICAL DATA:  Evaluate pneumothorax, shortness of breath and chest pain  EXAM: CHEST - 1 VIEW  COMPARISON:  Earlier same day; CT abdomen pelvis - earlier same day; 03/19/2013  FINDINGS: Grossly unchanged cardiac silhouette and mediastinal contours. Stable positioning of support apparatus. There has been minimal re-expansion of the right lung with persistent small right-sided hydro pneumothorax. No significant deviation of the cardiomediastinal structures. Possible minimal increase in the amount of right-sided pleural fluid. Small amount of left-sided pleural fluid appear grossly unchanged. Improved aeration of the right lower lung with persistent bibasilar heterogeneous opacities, right greater than left. No new focal airspace opacities. No definite evidence of edema. Grossly unchanged bones.  IMPRESSION: 1. Minimal re-expansion of the right lung with persistent small right-sided hydro pneumothorax. Given the presence of a right basilar approach chest tube, this pneumothorax may be ex vacuo  in etiology. Clinical correlation is advised. 2. Unchanged small left-sided pleural effusion. 3. Improved aeration of the right lung with persistent bibasilar opacities, right greater than left, atelectasis versus infiltrate.   Electronically Signed   By: Sandi Mariscal M.D.   On: 05/04/2013 14:55   Ct Abdomen Pelvis W Contrast  05/04/2013   CLINICAL DATA:  Abdominal pain. Fever. Chills. Undergoing chemotherapy for pancreatic carcinoma.  EXAM: CT ABDOMEN AND PELVIS WITH CONTRAST  TECHNIQUE: Multidetector CT imaging of the abdomen and pelvis was  performed using the standard protocol following bolus administration of intravenous contrast.  CONTRAST:  145mL OMNIPAQUE IOHEXOL 300 MG/ML  SOLN  COMPARISON:  03/19/2013  FINDINGS: Right chest tube is now seen in place with a right basilar hydropneumothorax. Collapse or consolidation is seen in the right lower lung. New small to moderate left pleural effusion noted.  New moderate ascites is seen. Peritoneal enhancement is also noted, and omental caking is again demonstrated, consistent with peritoneal carcinomatosis. No evidence of bowel obstruction.  Hypovascular mass in the pancreatic body is again seen which currently measures 4.1 x 3.8 cm on image 69 this is not significant change since previous study.  Probable tiny sub-cm hepatic cysts are stable. No definite liver masses are identified. The spleen, and adrenal glands are normal in appearance. Tiny renal cyst remains stable and there is no evidence of renal masses or hydronephrosis. No lymphadenopathy identified no evidence focal inflammatory process or bowel obstruction. No suspicious bone lesions identified.  IMPRESSION: New ascites and findings consistent with increased peritoneal carcinomatosis.  Large right basilar hydropneumothorax with right chest tube in place.  Collapse or consolidation in right lower lung. Infection or neoplasm cannot be excluded.  New small to moderate left pleural effusion.  Stable 4 cm hypovascular mass the pancreatic body, consistent with known primary pancreatic carcinoma.   Electronically Signed   By: Earle Gell M.D.   On: 05/04/2013 14:52   US Abdomen Limited  05/05/2013   CLINICAL DATA:  Patient with recently diagnosed pancreatic cancer with possible metastatic disease. Abdominal pain. Ascites noted on CT of the abdomen  EXAM: US ABDOMEN LIMITED - RIGHT UPPER QUADRANT  COMPARISON:  CT of the abdomen on 05/04/2013.  FINDINGS: Limited ultrasound of the abdomen findings small volume of ascites in the perihepatic and  perisplenic regions. These areas are not amenable for percutaneous paracentesis. There is also a potential collection of fluid in the low pelvis note is very difficult to differentiate from fluid versus fluid-filled intestine. It was felt that this was not safe for percutaneous paracentesis.  IMPRESSION: Small volume perihepatic, perisplenic ascites not amenable for paracentesis. PROCEDURE not performed.  Read by: Ascencion Dike PA-C   Electronically Signed   By: Maryclare Bean M.D.   On: 05/05/2013 15:55   Dg Chest Port 1 View  05/06/2013   CLINICAL DATA:  Pneumothorax.  EXAM: PORTABLE CHEST - 1 VIEW  COMPARISON:  05/05/2013.  FINDINGS: Port-A-Cath in stable anatomic position. Persistent prominent right pneumothorax. Patient's physician has previously been notified of this finding. Chest tube noted over right chest in stable position. Heart size is stable. Pulmonary infiltrate on the left has improved. Left-sided pleural effusion has increased in size. No acute osseous abnormality.  IMPRESSION: 1. Persistent right pneumothorax. Chest tube in stable position. Patient's physician has been previously notified of this finding. 2. Partial clearing of left pulmonary infiltrate. Developing left pleural effusion.   Electronically Signed   By: Marcello Moores  Register   On: 05/06/2013 07:26  Dg Chest Port 1 View  05/05/2013   CLINICAL DATA:  Followup pneumothorax.  EXAM: PORTABLE CHEST - 1 VIEW  COMPARISON:  05/04/2013  FINDINGS: Right IJ central venous catheter is unchanged. Small caliber right-sided chest tube has tip over the medial right base unchanged. There is continued evidence of a moderate to large right-sided pneumothorax which has worsened compared to the recent prior exam from 05/04/2013 at 1443 although is not significantly changed from the prior exam 05/04/2013 11:28 a.m. There is continued hazy opacification over the right lower thorax likely layering pleural fluid slightly improved. There is mild worsening left  perihilar and basilar opacification which may be due to interstitial edema. There is continued moderate consolidation over the inferior aspect of the collapsed right lung unchanged. Cardiomediastinal silhouette and remainder of the exam is unchanged.  IMPRESSION: Interval worsening of patient's moderate to large right-sided pneumothorax with small caliber chest tube in place and unchanged with tip over the medial right base.  Slightly improved right pleural fluid collection. Stable consolidation over the inferior aspect of the collapse right lung. Worsening left perihilar/ basilar opacification suggesting interstitial edema.  These results were called by telephone at the time of interpretation on 05/05/2013 at 7:31 AM to Dr. Janece Canterbury , who verbally acknowledged these results.   Electronically Signed   By: Marin Olp M.D.   On: 05/05/2013 07:31   Dg Chest Port 1 View  05/04/2013   CLINICAL DATA:  Shortness of breath.  EXAM: PORTABLE CHEST - 1 VIEW  COMPARISON:  None.  FINDINGS: Right chest tube is noted of the right lower chest. A right pneumothorax is present. The pneumothorax is prominent and may be under tension. Atelectasis and effusion on the right noted. Port-A-Cath noted in good anatomic position. Small left pleural effusion. Mild left base atelectasis. Heart size normal.  IMPRESSION: 1. Prominent right pneumothorax with with probable tension. Right chest tube is noted over the right chest. 2. Bibasilar atelectasis with bilateral pleural effusions. Critical Value/emergent results were called by telephone at the time of interpretation on 05/04/2013 at 11:50 AM to Dr. Joseph Berkshire , who verbally acknowledged these results.   Electronically Signed   By: Marcello Moores  Register   On: 05/04/2013 11:50    Scheduled Meds: . aspirin  81 mg Oral Daily  . atorvastatin  40 mg Oral Daily  . Canagliflozin  1 tablet Oral Daily  . celecoxib  100 mg Oral Daily  . cholecalciferol  5,000 Units Oral Daily  .  diclofenac  1 patch Transdermal BID  . DULoxetine  60 mg Oral Daily  . enoxaparin (LOVENOX) injection  40 mg Subcutaneous Q24H  . feeding supplement (ENSURE COMPLETE)  237 mL Oral TID BM  . insulin aspart  0-5 Units Subcutaneous QHS  . insulin aspart  0-9 Units Subcutaneous TID WC  . lidocaine-prilocaine  1 application Topical Once  . metoprolol succinate  25 mg Oral Daily  . morphine  45 mg Oral Q12H  . ondansetron (ZOFRAN) IV  4 mg Intravenous Q6H  . pantoprazole  40 mg Oral Daily  . piperacillin-tazobactam (ZOSYN)  IV  3.375 g Intravenous Q8H  . sodium chloride  3 mL Intravenous Q12H  . vancomycin  1,000 mg Intravenous Q8H   Continuous Infusions:    Principal Problem:   Pneumothorax on right Active Problems:   CAD (coronary artery disease)   Pancreatic cancer with malignant pleural effusions and ascites   Metabolic acidosis, increased anion gap   Hyponatremia   Diabetes  mellitus type 2 in nonobese   HCAP (healthcare-associated pneumonia)   Sepsis   Abdominal pain   Diarrhea   Palliative care patient   Protein-calorie malnutrition, severe    Time spent: [facility] Pager [phone]. If 7PM-7AM, please contact night-coverage at [email or web address], password [facility] 05/06/2013, 9:27 AM  LOS: 2 days

## 2013-05-06 NOTE — Progress Notes (Signed)
Patient KZ:SWFU Bivins      DOB: [birth year]      XNA:[id number]   Palliative Medicine Team at [facility] Progress Note    Subjective: Mr. Naeem states that his pain is much improved yesterday since starting on diclofenac patch. At this time he has no requests. He states that his pain is reasonably controlled. No records have come from the cancer treatment centers [place] [facility] I will request records again in the a.m.     Filed Vitals:   05/06/13 1800  BP: 110/62  Pulse: 106  Temp:   Resp: 10   Physical exam: General: No acute distress breathing seems light the labored but not troublesome to the patient Pupils are equal round and reactive to light extraocular muscles are intact his membranes are moist he is focused on the television with good attention Chest is decreased but clear to auscultation and don't appreciate any rhonchi rales or wheezes but the breath sounds do appear distant, right-sided Pleurx in place Cardiovascular: Tachycardic positive S1 and S2 no S3 or S4 Abdomen slightly distended with ascites nontender Extremities trace to 1 edema Neurologically awake alert oriented cranial nerves II through XII appear to be intact   X-ray: 1. Persistent right pneumothorax. Chest tube in stable position.  Patient's physician has been previously notified of this finding.  2. Partial clearing of left pulmonary infiltrate. Developing left  pleural effusion.      Assessment and plan: 58 year old white male with known pancreatic cancer receiving treatment at the cancer treatment centers of [place] in [place]. Patient was admitted with abdominal pain radiating to his back. He is status post Pleurx catheter placement one week prior to admission for recurrent pleural effusion. His post procedure oral course was complicated by a pneumothorax for which he was hospitalized in [place]. He returns to the [place] area and became progressively more short of breath. He developed sudden onset 10 out  of 10 left upper quadrant pain with radiation to the epigastrium and suprapubic tenderness. He was admitted to the hospital and found to have fever with shortness of breath and persistent pneumothorax. His Pleurx catheter was placed to suction which improved shortness of breath. The patient was found to have increasing ascites however. Goals of care were completed at which time the patient stated he would not want to be intubated nor have CPR but he did want to have aggressive treatment of reversible conditions as he continues to see curative care at the cancer treatment of [place] in [place]. We have requested records as he's been seeing being seen by a pain specialist there. The patient's most prominent pain was left scapular pain for which a diclofenac patch was placed and seems to be improving his overall pain control. He has continued with his extended release opiate and when necessary Dilaudid which he used less of today.  1. No CPR no intubation. Please utilized vasopressors and antiarrhythmics in an attempt to treat treatable conditions.  2. Left scapular pain. Continue diclofenac patch. Reassess creatinine. Continue long-acting opiate with short acting Dilaudid  3. We'll attempt to obtain records again from cancer treatment centers of [place] in a.m.    Total time 15 minutes 6:15 to 6:30 PM  Leelyn Jasinski L. Lovena Le, MD MBA The Palliative Medicine Team at [facility] Phone: [phone] Pager: [phone]

## 2013-05-07 ENCOUNTER — Inpatient Hospital Stay (HOSPITAL_COMMUNITY): Payer: BC Managed Care – PPO

## 2013-05-07 DIAGNOSIS — E871 Hypo-osmolality and hyponatremia: Secondary | ICD-10-CM

## 2013-05-07 LAB — URINE CULTURE
CULTURE: NO GROWTH
Colony Count: NO GROWTH

## 2013-05-07 LAB — BASIC METABOLIC PANEL
BUN: 15 mg/dL (ref 6–23)
CO2: 27 mEq/L (ref 19–32)
Calcium: 7.9 mg/dL — ABNORMAL LOW (ref 8.4–10.5)
Chloride: 90 mEq/L — ABNORMAL LOW (ref 96–112)
Creatinine, Ser: 0.55 mg/dL (ref 0.50–1.35)
GLUCOSE: 133 mg/dL — AB (ref 70–99)
POTASSIUM: 3.7 meq/L (ref 3.7–5.3)
SODIUM: 127 meq/L — AB (ref 137–147)

## 2013-05-07 LAB — CBC
HCT: 32.6 % — ABNORMAL LOW (ref 39.0–52.0)
HEMOGLOBIN: 10.9 g/dL — AB (ref 13.0–17.0)
MCH: 28.2 pg (ref 26.0–34.0)
MCHC: 33.4 g/dL (ref 30.0–36.0)
MCV: 84.5 fL (ref 78.0–100.0)
Platelets: 115 10*3/uL — ABNORMAL LOW (ref 150–400)
RBC: 3.86 MIL/uL — ABNORMAL LOW (ref 4.22–5.81)
RDW: 14 % (ref 11.5–15.5)
WBC: 0.6 10*3/uL — CL (ref 4.0–10.5)

## 2013-05-07 LAB — GLUCOSE, CAPILLARY
GLUCOSE-CAPILLARY: 147 mg/dL — AB (ref 70–99)
GLUCOSE-CAPILLARY: 102 mg/dL — AB (ref 70–99)
Glucose-Capillary: 149 mg/dL — ABNORMAL HIGH (ref 70–99)
Glucose-Capillary: 92 mg/dL (ref 70–99)
Glucose-Capillary: 109 mg/dL — ABNORMAL HIGH (ref 70–99)
Glucose-Capillary: 121 mg/dL — ABNORMAL HIGH (ref 70–99)

## 2013-05-07 MED ORDER — POTASSIUM CHLORIDE 20 MEQ/15ML (10%) PO LIQD
20.0000 meq | ORAL | Status: AC
  Administered 2013-05-07 (×2): 20 meq
  Filled 2013-05-07 (×3): qty 15

## 2013-05-07 MED ORDER — CEFOXITIN SODIUM 1 G IV SOLR
1.0000 g | Freq: Four times a day (QID) | INTRAVENOUS | Status: DC
  Administered 2013-05-07 – 2013-05-08 (×4): 1 g via INTRAVENOUS
  Filled 2013-05-07 (×7): qty 1

## 2013-05-07 NOTE — Progress Notes (Signed)
CRITICAL VALUE ALERT  Critical value received:  WBC 0.6  Date of notification:  05/06/13  Time of notification:  0610  Critical value read back:yes  Nurse who received alert:  S.Young,RN  MD notified (1st page):  M. Lynch,NP   Time of first page:  0632  MD notified (2nd page):  Time of second page:  Responding MD:  M. Donnal Debar  Time MD responded:  [phone]

## 2013-05-07 NOTE — Progress Notes (Signed)
[facility] PROGRESS NOTE  Jimmy Christensen [id number] DOB: [birth year] DOA: 05/04/2013 PCP: [name], MD  Assessment/Plan Metastatic pancreatic cancer -patient admitted with malignant pleural effusions, persistent PTX, new abdominal ascites with worsening abdominal pain. Patient would still like aggressive palliative chemotherapy and is full code, but he is amenable to palliative following for symptom management  -  Dr. [name] was notified of admission per admitting M.D. - Appreciate palliative care assistance>> he wants to be a limited code(only vasopressors and antiarrhythmics in an attempt to treat treatable conditions), and wants to continue aggressive treatments and planning to followup with his oncologist in [place] for continued chemotherapy, unless not able to travel in which case he would consider it came on here. Right pneumothorax vs Trapped Lung  likely secondary to ongoing pleural leak which is probably from malignancy-recurrent  - s/p Pleurx tube placement 1/14 for recurrent pleural effusion - Chest x-ray on 1/20 with larger PTX  -Pleurx changed to waterseal for 24 hours on 1/20>> repeat chest x-ray on 1/21 with persistent right pneumothorax chest tube in stable position. Partial clearing of the pulmonary infiltrate and developing left pleural effusion -water seal dc'ed -Discussed with CCM/ Dr [place] Bells this morning and he agrees patient with no evidence of pneumonia will DC vancomycin and Zosyn -Awaiting followup chest x-ray this a.m. and if effusion bigger or unchanged plan per pulm is for thoracentesis - Management per pulmonology-appreciate the assistance   Fever with tachycardia = SIRS, Also possible malignancy-associated fever.  - Blood cx with no growth to date - UA neg, await urine culture -s/p  thoracentesis 1/19-studies so far with no evidence of infection - Followup chest x-ray shows infiltrate has cleared>> was most likely atelectasis - As above will DC vanc and zosyn>>  cultures remain negative  -He has defervesced, follow.  DOE, likely secondary to malignant effusion and PTX  - Appreciate pulmonology assistance  Abdominal pain, ddx includes SBP or pain from malignancy>> more likely secondary to malignancy - no Paracentesis done as ultrasound is only small amount of ascites not amenable for paracentesis -Given that the patient had fevers on admission and continues to have abdominal pain will place on empiric cefoxitin, treat for 5days.  - Obviously has malignant component , to follow up with onc when appropriate for treatment - his Pain better controlled on current pain regimen - continue MS contin  - Continue prn oral/IVdilaudid -Also continue diclofenac patch  Diarrhea, may be from recent constipation and lactulose, delayed side effect of chemotherapy, from cancer, or infectious.  None since admission -States no further diarrhea, follow studies  Nausea and vomiting - Continue schedule zofran, prn  phenergan for breakthrough   + gap metabolic acidosis, may be mixed due to ketosis, lactic acidosis, and diarrhea - continue gentle hydration -Follow recheck the meds in a.m.  CAD, stable. Continue atorvastatin and ASA, but continue to address utility of these medications   T2DM, stable.  - A1c 8 - Hold oral meds  - Start low dose SSI  -  Continue lantus Hyponatremia -Possibly hypervolemia given his ascites vs SIADH -will obtain urine lytes and further treat accordingly   Code Status: limited code- no intubation and no CPR, ONLY MEDS(vasopressors and antiarrhythmics as appropriate)  Family Communication: patient and wife  Disposition Plan:   Continue stepdown for now  Consultants:  PCCM  Procedures:  CXR  CT abd/pelvis  Antibiotics:  vanc 1/19 >>  Zosyn 1/19 >>   HPI/Subjective:  States that his pain is better controlled today, denies  any nausea vomiting. Reports his abdomen still feels bloated. Denies shortness of  breath.  Objective: Filed Vitals:   05/06/13 1935 05/07/13 0010 05/07/13 0400 05/07/13 0510  BP: 113/55 101/53  109/56  Pulse: 109 109  107  Temp: 97.1 F (36.2 C) 97.5 F (36.4 C) 97.8 F (36.6 C)   TempSrc: Axillary Oral Oral   Resp: 12 12  19   Height:      Weight:   90.1 kg (198 lb 10.2 oz)   SpO2: 97% 98%  100%    Intake/Output Summary (Last 24 hours) at 05/07/13 0949 Last data filed at 05/07/13 0854  Gross per 24 hour  Intake   1930 ml  Output   1275 ml  Net    655 ml   Filed Weights   05/05/13 0400 05/06/13 0400 05/07/13 0400  Weight: 85.5 kg (188 lb 7.9 oz) 87.9 kg (193 lb 12.6 oz) 90.1 kg (198 lb 10.2 oz)    Exam:   General:  Middle aged male, No acute distress  HEENT:  NCAT, MMM  Cardiovascular:  RRR, nl S1, S2 no mrg, 2+ pulses, warm extremities  Respiratory: Decreased breath sounds at bases bilaterally, no rales or rhonchi or wheeze, no increased WOB.  Pleurex to suction.  Abdomen:soft, distended.  Tenderness in LUQ and lower abdomen  Extremities: No cyanosis and no edema  Neuro:  Grossly intact  Data Reviewed: Basic Metabolic Panel:  Recent Labs Lab 05/04/13 1135 05/04/13 1820 05/07/13 0500  NA 132* 131* 127*  K 4.2 4.4 3.7  CL 93* 90* 90*  CO2 17* 25 27  GLUCOSE 157* 155* 133*  BUN 17 16 15   CREATININE 0.61 0.65 0.55  CALCIUM 8.4 8.3* 7.9*   Liver Function Tests:  Recent Labs Lab 05/04/13 1135  AST 28  ALT 24  ALKPHOS 75  BILITOT 0.6  PROT 5.2*  ALBUMIN 2.0*    Recent Labs Lab 05/04/13 1135  LIPASE 8*   No results found for this basename: AMMONIA,  in the last 168 hours CBC:  Recent Labs Lab 05/02/13 1130 05/04/13 1135 05/07/13 0500  WBC 5.8 4.0 0.6*  NEUTROABS  --  3.5  --   HGB 11.7* 15.5 10.9*  HCT 35.1* 44.7 32.6*  MCV 86.2 84.2 84.5  PLT 208 216 115*   Cardiac Enzymes:  Recent Labs Lab 05/04/13 1135  TROPONINI <0.30   BNP (last 3 results)  Recent Labs  05/04/13 1135  PROBNP 542.0*    CBG:  Recent Labs Lab 05/06/13 0740 05/06/13 1132 05/06/13 1627 05/06/13 2226 05/07/13 0753  GLUCAP 163* 180* 147* 149* 102*    Recent Results (from the past 240 hour(s))  CULTURE, BLOOD (ROUTINE X 2)     Status: None   Collection Time    05/04/13 11:35 AM      Result Value Range Status   Specimen Description BLOOD LEFT HAND   Final   Special Requests BOTTLES DRAWN AEROBIC AND ANAEROBIC 3ML   Final   Culture  Setup Time     Final   Value: 05/04/2013 14:07     Performed at [facility]   Culture     Final   Value:        BLOOD CULTURE RECEIVED NO GROWTH TO DATE CULTURE WILL BE HELD FOR 5 DAYS BEFORE ISSUING A FINAL NEGATIVE REPORT     Performed at [facility]   Report Status PENDING   Incomplete  CULTURE, BLOOD (ROUTINE X 2)  Status: None   Collection Time    05/04/13 11:40 AM      Result Value Range Status   Specimen Description BLOOD RIGHT HAND   Final   Special Requests BOTTLES DRAWN AEROBIC ONLY 3ML   Final   Culture  Setup Time     Final   Value: 05/04/2013 14:07     Performed at [facility]   Culture     Final   Value:        BLOOD CULTURE RECEIVED NO GROWTH TO DATE CULTURE WILL BE HELD FOR 5 DAYS BEFORE ISSUING A FINAL NEGATIVE REPORT     Performed at [facility]   Report Status PENDING   Incomplete  BODY FLUID CULTURE     Status: None   Collection Time    05/04/13  2:08 PM      Result Value Range Status   Specimen Description PLEURAL   Final   Special Requests Immunocompromised   Final   Gram Stain     Final   Value: CYTOSPIN FEW WBC PRESENT,BOTH PMN AND MONONUCLEAR     NO ORGANISMS SEEN     Performed by [facility] Gram Stain Report Called to,Read Back By and Verified With: Gram Stain Report Called to,Read Back By and Verified With:  J BEAN RN AT 6433 ON01/19/15 BY A NAVARRO     Performed at [facility]     Final   Value: NO GROWTH 1 DAY     Performed at [facility]   Report  Status PENDING   Incomplete  GRAM STAIN     Status: None   Collection Time    05/04/13  2:08 PM      Result Value Range Status   Specimen Description PLEURAL   Final   Special Requests Immunocompromised   Final   Gram Stain     Final   Value: CYTOSPIN     FEW WBC PRESENT,BOTH PMN AND MONONUCLEAR     NO ORGANISMS SEEN     Gram Stain Report Called to,Read Back By and Verified With: J BEAN RN [phone] 05/04/13 A NAVARRO   Report Status 05/04/2013 FINAL   Final  MRSA PCR SCREENING     Status: None   Collection Time    05/04/13  5:23 PM      Result Value Range Status   MRSA by PCR NEGATIVE  NEGATIVE Final   Comment:            The GeneXpert MRSA Assay (FDA     approved for NASAL specimens     only), is one component of a     comprehensive MRSA colonization     surveillance program. It is not     intended to diagnose MRSA     infection nor to guide or     monitor treatment for     MRSA infections.  CLOSTRIDIUM DIFFICILE BY PCR     Status: None   Collection Time    05/06/13 12:36 AM      Result Value Range Status   C difficile by pcr NEGATIVE  NEGATIVE Final   Comment: Performed at [place]     Status: None   Collection Time    05/06/13 12:36 AM      Result Value Range Status   Specimen Description STOOL   Final   Special Requests Normal   Final   Culture  Final   Value: NO SUSPICIOUS COLONIES, CONTINUING TO HOLD     Performed at [facility]   Report Status PENDING   Incomplete     Studies: US Abdomen Limited  05/05/2013   CLINICAL DATA:  Patient with recently diagnosed pancreatic cancer with possible metastatic disease. Abdominal pain. Ascites noted on CT of the abdomen  EXAM: US ABDOMEN LIMITED - RIGHT UPPER QUADRANT  COMPARISON:  CT of the abdomen on 05/04/2013.  FINDINGS: Limited ultrasound of the abdomen findings small volume of ascites in the perihepatic and perisplenic regions. These areas are not amenable for percutaneous  paracentesis. There is also a potential collection of fluid in the low pelvis note is very difficult to differentiate from fluid versus fluid-filled intestine. It was felt that this was not safe for percutaneous paracentesis.  IMPRESSION: Small volume perihepatic, perisplenic ascites not amenable for paracentesis. PROCEDURE not performed.  Read by: Ascencion Dike PA-C   Electronically Signed   By: Maryclare Bean M.D.   On: 05/05/2013 15:55   Dg Chest Port 1 View  05/06/2013   CLINICAL DATA:  Pneumothorax.  EXAM: PORTABLE CHEST - 1 VIEW  COMPARISON:  05/05/2013.  FINDINGS: Port-A-Cath in stable anatomic position. Persistent prominent right pneumothorax. Patient's physician has previously been notified of this finding. Chest tube noted over right chest in stable position. Heart size is stable. Pulmonary infiltrate on the left has improved. Left-sided pleural effusion has increased in size. No acute osseous abnormality.  IMPRESSION: 1. Persistent right pneumothorax. Chest tube in stable position. Patient's physician has been previously notified of this finding. 2. Partial clearing of left pulmonary infiltrate. Developing left pleural effusion.   Electronically Signed   By: Marcello Moores  Register   On: 05/06/2013 07:26    Scheduled Meds: . aspirin  81 mg Oral Daily  . atorvastatin  40 mg Oral Daily  . Canagliflozin  1 tablet Oral Daily  . celecoxib  100 mg Oral Daily  . cholecalciferol  5,000 Units Oral Daily  . diclofenac  1 patch Transdermal BID  . DULoxetine  60 mg Oral Daily  . enoxaparin (LOVENOX) injection  40 mg Subcutaneous Q24H  . feeding supplement (ENSURE COMPLETE)  237 mL Oral TID BM  . insulin aspart  0-5 Units Subcutaneous QHS  . insulin aspart  0-9 Units Subcutaneous TID WC  . lidocaine-prilocaine  1 application Topical Once  . metoprolol succinate  25 mg Oral Daily  . morphine  45 mg Oral Q12H  . ondansetron (ZOFRAN) IV  4 mg Intravenous Q6H  . pantoprazole  40 mg Oral Daily  .  piperacillin-tazobactam (ZOSYN)  IV  3.375 g Intravenous Q8H  . potassium chloride  20 mEq Per Tube Q4H  . sodium chloride  3 mL Intravenous Q12H  . vancomycin  1,000 mg Intravenous Q8H   Continuous Infusions:    Principal Problem:   Pneumothorax on right Active Problems:   CAD (coronary artery disease)   Pancreatic cancer with malignant pleural effusions and ascites   Metabolic acidosis, increased anion gap   Hyponatremia   Diabetes mellitus type 2 in nonobese   HCAP (healthcare-associated pneumonia)   Sepsis   Abdominal pain   Diarrhea   Palliative care patient   Protein-calorie malnutrition, severe    Time spent: [facility] Pager [phone]. If 7PM-7AM, please contact night-coverage at [email or web address], password [facility] 05/07/2013, 9:49 AM  LOS: 3 days

## 2013-05-07 NOTE — Progress Notes (Signed)
Physical Therapy Treatment Patient Details Name: Stonewall Doss MRN: [id number] DOB: 03-29-1956 Today's Date: 05/07/2013 Time: 0998-3382 PT Time Calculation (min): 26 min  PT Assessment / Plan / Recommendation  History of Present Illness admitted with abdominal pain, pancreatic cancer., plearal effusion, PTX   PT Comments   Pt wanted to ambulate in hall. Did not have O2 as did not knw pt was going as far. Sats may have dropped into 70's-difficult to assess with not a good wave form. When returned to room, sats back to 96%. Pt did not appear to be in resp. Distress. Will monitor more closely and have O2 ready if needed when ambulating farther next visit.  Follow Up Recommendations  Home health PT     Does the patient have the potential to tolerate intense rehabilitation     Barriers to Discharge        Equipment Recommendations  None recommended by PT    Recommendations for Other Services    Frequency Min 3X/week   Progress towards PT Goals Progress towards PT goals: Progressing toward goals  Plan Current plan remains appropriate    Precautions / Restrictions Precautions Precautions: Fall Precaution Comments: monitor sats   Pertinent Vitals/Pain HR 123 walking, sats 76-100% Pain back and R scapula. Had meds 2 hours ago.   Mobility  Bed Mobility Bed Mobility: Supine to Sit Supine to sit: Min assist General bed mobility comments: assist for trunk Transfers Overall transfer level: Needs assistance Equipment used: 1 person hand held assist (IV pole) Transfers: Sit to/from Stand Sit to Stand: Min guard;From elevated surface Ambulation/Gait Ambulation/Gait assistance: +2 safety/equipment;Min assist Ambulation Distance (Feet): 80 Feet Assistive device: 1 person hand held assist (IV pole.) Gait Pattern/deviations: Step-through pattern Gait velocity: decr General Gait Details: encouraged pursed lip breathing.    Exercises     PT Diagnosis:    PT Problem List:   PT Treatment  Interventions:     PT Goals (current goals can now be found in the care plan section)    Visit Information  Last PT Received On: 05/07/13 Assistance Needed: +2 History of Present Illness: admitted with abdominal pain, pancreatic cancer., plearal effusion, PTX    Subjective Data      Cognition  Cognition Arousal/Alertness: Awake/alert Behavior During Therapy: WFL for tasks assessed/performed Overall Cognitive Status: Within Functional Limits for tasks assessed    Balance  Balance Overall balance assessment: Needs assistance Sitting-balance support: No upper extremity supported Sitting balance-Leahy Scale: Good Standing balance support: No upper extremity supported Standing balance-Leahy Scale: Fair Standing balance comment: lost balance when turned to walk.  End of Session PT - End of Session Activity Tolerance: Patient tolerated treatment well Patient left: in bed;with call bell/phone within reach;with family/visitor present Nurse Communication: Mobility status   GP     Claretha Cooper 05/07/2013, 4:51 PM Tresa Endo PT [phone]

## 2013-05-07 NOTE — Progress Notes (Addendum)
[facility] Name: Jimmy Christensen MRN: [id number] DOB: 11/02/1955    ADMISSION DATE:  05/04/2013 CONSULTATION DATE:  1/19  REFERRING MD :  Betsey Holiday  PRIMARY SERVICE:  [facility]   CHIEF COMPLAINT:  Dyspnea/ possible pneumothorax   BRIEF PATIENT DESCRIPTION:   This is a 58 year old male recently diagnosed w/ stage IV pancreatic cancer in December. He is receiving his chemotherapy at [facility] in [place] (last chemo 1/15, and recently had Pleurx tube placed on 1/14, drained last 1/18) . Presents to ER at [facility] on 1/19 with 1d h/o progressive abd pain, fever and associated dyspnea. On eval pt found to have large right PTX. Per EDP had sig rush of air when vac bottle attached. PCCM asked to eval to assist in management of PTX.   SIGNIFICANT EVENTS / STUDIES:  Last chemo 1/15  Right pleur-x tube placed 1/14 To ER 1/17 with constipation  1/19 presents w/ abd pain, but found to have large PTX on film. Placed Pleur-x to [facility]  1/20 CXR > pleural effusion gone, pneumothorax bigger 1/21 CXR > stable findings on right, perhaps left effusion bigger 1/21 CXR > left effusion stable, R hydropneumothorax  LINES / TUBES: Right pleur-x tube 1/14>>>  CULTURES:  ANTIBIOTICS:   SUBJECTIVE: improved belly pain, no change in dyspnea   VITAL SIGNS: Temp:  [97.1 F (36.2 C)-97.8 F (36.6 C)] 97.8 F (36.6 C) (01/22 0400) Pulse Rate:  [103-115] 107 (01/22 0510) Resp:  [10-20] 19 (01/22 0510) BP: (95-121)/(53-78) 109/56 mmHg (01/22 0510) SpO2:  [97 %-100 %] 100 % (01/22 0510) Weight:  [90.1 kg (198 lb 10.2 oz)] 90.1 kg (198 lb 10.2 oz) (01/22 0400)  PHYSICAL EXAMINATION: General:  Comfortable  HEENT: NCAT, EOMi PULM: diminished on R, CTA on left AB: BS+, minimal tenderness LUQ Ext: warm, no edema Neuro: A&Ox4, MAEW   Recent Labs Lab 05/04/13 1135 05/04/13 1820 05/07/13 0500  NA 132* 131* 127*  K 4.2 4.4 3.7  CL 93* 90* 90*  CO2 17* 25 27  BUN 17 16 15   CREATININE 0.61 0.65 0.55   GLUCOSE 157* 155* 133*    Recent Labs Lab 05/02/13 1130 05/04/13 1135 05/07/13 0500  HGB 11.7* 15.5 10.9*  HCT 35.1* 44.7 32.6*  WBC 5.8 4.0 0.6*  PLT 208 216 115*     ASSESSMENT / PLAN: Abd pain/fever in setting of known stage IV pancreatic cancer w/ mets to omentum, peritoneum and lymph. ? Cancer related pain, no clear source identified on serial exam and CT scan; improved Undergoing chemotherapy at [facility].  Plan Per [facility] Aggressive pain control  Right Trapped lung from malignant effusion> today's CXR findings are expected since we removed water seal overnight  Left small pleural effusion> sympathetic? Malignant?  Stable at this point  Again, no evidence of pneumonia  Plan: -effusion on left is stable, but needs to be monitored aggressively -he needs a Chest X-ray by his PCP on 1/26 to monitor this -PCP can call me [phone] to discuss if needed -he is to follow up with [name] in [place] on 1/28 -continue pleurex drainage every other day for now  No indication for hospitalization from pulmonary standpoint; Abdominal pain treatment/disposition per [facility]  He can see me in the office if desired, I offered this.  For now he has a good plan and frequent visits with his pulmonologist in [place]  Wife updated at bedside  [name] PCCM Pager: [phone] Cell: [phone] If no response,  call [phone]

## 2013-05-08 DIAGNOSIS — E43 Unspecified severe protein-calorie malnutrition: Secondary | ICD-10-CM

## 2013-05-08 LAB — BODY FLUID CULTURE: CULTURE: NO GROWTH

## 2013-05-08 LAB — OSMOLALITY, URINE: Osmolality, Ur: 800 mOsm/kg (ref 390–1090)

## 2013-05-08 LAB — GLUCOSE, CAPILLARY: Glucose-Capillary: 123 mg/dL — ABNORMAL HIGH (ref 70–99)

## 2013-05-08 LAB — BASIC METABOLIC PANEL
BUN: 13 mg/dL (ref 6–23)
CO2: 26 mEq/L (ref 19–32)
Calcium: 8.3 mg/dL — ABNORMAL LOW (ref 8.4–10.5)
Chloride: 90 mEq/L — ABNORMAL LOW (ref 96–112)
Creatinine, Ser: 0.54 mg/dL (ref 0.50–1.35)
Glucose, Bld: 134 mg/dL — ABNORMAL HIGH (ref 70–99)
Potassium: 4.1 mEq/L (ref 3.7–5.3)
Sodium: 129 mEq/L — ABNORMAL LOW (ref 137–147)

## 2013-05-08 LAB — SODIUM, URINE, RANDOM: SODIUM UR: 7 meq/L

## 2013-05-08 MED ORDER — DICLOFENAC EPOLAMINE 1.3 % TD PTCH: 1.0000 | MEDICATED_PATCH | Freq: Two times a day (BID) | TRANSDERMAL | Status: DC

## 2013-05-08 MED ORDER — ENSURE COMPLETE PO LIQD: 237.0000 mL | Freq: Three times a day (TID) | ORAL | Status: AC

## 2013-05-08 MED ORDER — CELECOXIB 100 MG PO CAPS: 100.0000 mg | ORAL_CAPSULE | Freq: Every day | ORAL | Status: DC

## 2013-05-08 MED ORDER — LACTULOSE 10 GM/15ML PO SOLN: 10.0000 g | Freq: Two times a day (BID) | ORAL | Status: DC | PRN

## 2013-05-08 MED ORDER — CIPROFLOXACIN HCL 500 MG PO TABS: 500.0000 mg | ORAL_TABLET | Freq: Two times a day (BID) | ORAL | Status: DC

## 2013-05-08 MED ORDER — HEPARIN SOD (PORK) LOCK FLUSH 100 UNIT/ML IV SOLN
500.0000 [IU] | Freq: Once | INTRAVENOUS | Status: AC
  Administered 2013-05-08: 500 [IU] via INTRAVENOUS
  Filled 2013-05-08: qty 5

## 2013-05-08 MED ORDER — HYDROCORTISONE 2.5 % RE CREA: TOPICAL_CREAM | Freq: Two times a day (BID) | RECTAL | Status: AC | PRN

## 2013-05-08 NOTE — Progress Notes (Signed)
Physical Therapy Treatment Patient Details Name: Nirav Sweda MRN: [id number] DOB: 11-04-1955 Today's Date: 05/08/2013 Time: 1308-6578 PT Time Calculation (min): 36 min  PT Assessment / Plan / Recommendation  History of Present Illness admitted with abdominal pain, pancreatic cancer., plearal effusion, PTX   PT Comments   Pt. Ambulated x 200" without oxygen with sats > 90%. No overt dyspnea.  Pt  Encouraged to ambulate  When able to get assistance. Encouraged to not use IV pole. Pt may benefit from a HHPT safety eval due to pt's generalized weakness. May not require HH unless DC'd soon. Pt states he may go home today.  Follow Up Recommendations  Home health PT     Does the patient have the potential to tolerate intense rehabilitation     Barriers to Discharge        Equipment Recommendations  None recommended by PT    Recommendations for Other Services    Frequency Min 3X/week   Progress towards PT Goals Progress towards PT goals: Progressing toward goals  Plan Current plan remains appropriate    Precautions / Restrictions Precautions Precautions: Fall Precaution Comments: monitor sats Restrictions Weight Bearing Restrictions: No   Pertinent Vitals/Pain HR 130 during mobility. sats 90-96%   Mobility  Bed Mobility Bed Mobility: Supine to Sit Supine to sit: Supervision Sit to supine: Supervision General bed mobility comments: improved bed mobility, not assistance required. Transfers Transfers: Sit to/from Stand Sit to Stand: Supervision General transfer comment: from bed and toilet with a rail. Ambulation/Gait Ambulation/Gait assistance: Min guard Ambulation Distance (Feet): 200 Feet Assistive device: None Gait Pattern/deviations: Step-through pattern Gait velocity: decr Gait velocity interpretation: Below normal speed for age/gender General Gait Details: pt ambulates slowly,  pt was able to self ambulate without HH assist., no IV pole.    Exercises     PT  Diagnosis:    PT Problem List:   PT Treatment Interventions:     PT Goals (current goals can now be found in the care plan section)    Visit Information  Last PT Received On: 05/08/13 Assistance Needed: +1 History of Present Illness: admitted with abdominal pain, pancreatic cancer., plearal effusion, PTX    Subjective Data      Cognition  Cognition Arousal/Alertness: Awake/alert    Balance  Balance Sitting balance-Leahy Scale: Good Standing balance support: No upper extremity supported;During functional activity  End of Session PT - End of Session Activity Tolerance: Patient tolerated treatment well Patient left: with call bell/phone within reach;in chair;with family/visitor present Nurse Communication: Mobility status   GP     Claretha Cooper 05/08/2013, 10:47 AM Tresa Endo PT [phone]

## 2013-05-08 NOTE — Discharge Summary (Addendum)
Physician Discharge Summary  Jimmy Christensen WUX:[id number] DOB: 09/12/1955 DOA: 05/04/2013  PCP: [name], MD  Admit date: 05/04/2013 Discharge date: 05/08/2013  Time spent: >30 minutes  Recommendations for Outpatient Follow-up:  Follow-up Information   Follow up with [name], MD. (in 1week, call for appt upon discharge)    Specialty:  Family Medicine   Contact information:   [address] [place] [address] [phone]       Please follow up. (oncologist in [place] as scheduled next week)       Please follow up. (Pulmonologist [name] in [place] on 128 as scheduled or locally with 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 year-old male with history of CAD s/p MI with DES in 2006 followed by Dr. [name] and recently diagnosed metastatic pancreatic cancer, seen previously by Dr. [name], but currently undergoing treatment at [facility] in [place] who presents with progressive SOB, fever, and abdominal pain. The patient was last at their baseline health several months ago. He was diagnosed with pancreatic cancer in fall of 2014 due to abdominal pain which radiated to the back associated  with unexplained weight loss. At the time of diagnosis, he had a 4cm tumor of the tail of the pancreas and peritoneal disease. He was completed two cycles of FOLFIRINOX, last cycle completed 1 week ago in [place]. His has tolerated chemotherapy well, however, his course has been complicated by a right malignant pleural effusion. About 3-4 weeks ago, he underwent thoracentesis with removal of 6L of fluid, however, his fluid reaccumulated so he had a pleurex catheter placed 1 week prior to admission. Shortly after placement (within 24 hours), he became hypoxic and was admitted for two days in [place] with pneumothorax. He had his pleurex catheter placed to suction and the lung reexpanded to 80%. He was discharged 3 days ago. Since going home, he has become progressively more short of breath. Yesterday, he developed sudden onset 10/10 LUQ pain with radiation to the epigastrium and similar sharp pain in the suprapubic region with radiation to the RLQ. The pain has waxed and waned from 7/10 up to 10/10, but did not improve until receiving pain medication in ER. He also had a fever to 101F this AM. He has had SOB without productive cough, + nausea without vomiting this morning for which he took antiemetics. Last week he was constipated, however, he received an enema and then subsequently has been having watery diarrhea with some near-incontinence. In the ED, he was borderline hypotensive to 100/50s, tachycardic to 130s. Labs were notable for Na 132, chloride 93, bicarb 17, hgb 11.7. CXR demonstrated large right  pneumothorax that appeared to be under some tension. Pulmonology was consulted and placed his pleurex catheter to suction which has improved his SOB. CT scan abd/pelvis demonstrates stable pancreatic mass, however, he has a new left pleural effusion, worsening of his peritoneal mets, and new ascites. He was admitted for further eval and management.  Hospital Course:  Metastatic pancreatic cancer  -As discussed above,  patient presented with malignant pleural effusions, persistent PTX, new abdominal ascites with worsening abdominal pain. Patient would still like aggressive palliative chemotherapy  - Dr. [name] was notified of admission per admitting M.D.  - Palliative care was consulted and met with patient and family for goals of care>> he wants to be a limited code(only vasopressors and antiarrhythmics in an attempt to treat treatable conditions), and wants to continue aggressive treatments.He plans to followup with his oncologist in [place] for continued chemotherapy, unless not able to travel in which case he would consider at that time to follow up with Dr. [name] here for chemotherapy.  -At this time is planning to travel to [place] next Tuesday for continued chemotherapy Right rapped Lung from malignant effusion from malignancy - On admission it was noted that s/p Pleurx tube placement 1/14 for recurrent pleural effusion  -Pulmonology was consulted and placed his pleurex catheter to suction which has improved his SOB -Chest x-ray on 1/20 with larger PTX  And pulmonologyPleurx changed to waterseal for 24 hours on 1/20>> repeat chest x-ray on 1/21 with persistent right pneumothorax chest tube in stable position. Partial clearing of the pulmonary infiltrate and developing left pleural effusion  -water seal dc'ed 1/22 -Discussed with CCM/ Dr [name] on 1/22 and he agrees patient with no evidence of pneumonia will DC vancomycin and Zosyn and patient has remained afebrile and hemodynamically stable. - followup chest 1/22 showed grossly unchanged small-to-moderate sized left-sided pleural effusion. Interval increase in the moderate amount of fluid within the right-sided possibly ex vacuo hydropneumothorax noted - effusion on left is stable, but needs to be monitored aggressively  -he needs a Chest X-ray by his PCP on 1/26 to monitor this as recommended per pulmonology -Patient improved clinically and is to follow up  outpatient as already discussed  Fever with tachycardia = SIRS, Also possible malignancy-associated fever.  - Blood cx with no growth to date  - UA neg, await urine culture  -s/p thoracentesis 1/19-studies so far with no evidence of infection  - Followup chest x-ray shows infiltrate has cleared>> was most likely atelectasis  - As above will DC vanc and zosyn>> cultures remain negative  -He has defervesced, follow.  DOE, likely secondary to malignant effusion and PTX  - Appreciate pulmonology assistance  Abdominal pain, ddx includes SBP or pain from malignancy>> more likely secondary to malignancy  - no Paracentesis done as ultrasound is only small amount of ascites not amenable for paracentesis  -Given that the patient had fevers on admission and with abdominal pain he was placed on antibiotics for presumptive SBP, I discussed patient with GI/Dr. Madilyn Fireman and he recommends to treat him for a total of 2 weeks. He'll be discharged on Cipro complete the antibiotic course.  - Obviously has malignant component , and is to follow up with onc for treatment  - his Pain better controlled on current pain regimen, discussed with Dr. Ladona Ridgel and she recommends to: - continue MS contin  - Continue prn oral -Also continue diclofenac patch upon discharge, and is to follow up at the pain clinic. Diarrhea, may be from recent constipation  and lactulose, delayed side effect of chemotherapy, from cancer, or infectious. None since admission  -States no further diarrhea, C. difficile was done and came back negative. Nausea and vomiting  - Continue schedule zofran, prn phenergan for breakthrough  + gap metabolic acidosis, may be mixed due to ketosis, lactic acidosis, and diarrhea  - continue gentle hydration  -Follow recheck the meds in a.m.  CAD, stable. Continue atorvastatin and ASA, but continue to address utility of these medications  T2DM, stable.  - A1c 8  - Hold oral meds  - Start low dose SSI  - Continue  lantus  Hyponatremia  -His sodium improved on followup today to 129 (from 127 on 1/22)and he is asymptomatic -His urine sodium is 7 which is more consistent with a vol depletion he is to continue po fluids and follow up with his PCP    Consultants:  PCCM Procedures:  CXR  CT abd/pelvis Pleural fluid from pleurex sent for studies on 1/19 Paracentesis ordered to be done per IR, but the ultrasound did not show enough fluid and so it was not done.   Discharge Exam: Filed Vitals:   05/08/13 1000  BP: 98/50  Pulse:   Temp:   Resp: 14   Exam:  General: Middle aged male, No acute distress  HEENT: NCAT, MMM  Cardiovascular: RRR, nl S1, S2 no mrg, 2+ pulses, warm extremities  Respiratory: Decreased breath sounds at bases bilaterally, no rales or rhonchi or wheeze, no increased WOB. Pleurex to suction.  Abdomen:soft, distended. Decreased Tenderness in LUQ and lower abdomen, bowel sounds present. Extremities: No cyanosis and no edema  Neuro: Grossly intact    Discharge Instructions  Discharge Orders   Future Orders Complete By Expires   Diet general  As directed    Increase activity slowly  As directed        Medication List    STOP taking these medications       meloxicam 7.5 MG tablet  Commonly known as:  MOBIC     senna-docusate 8.6-50 MG per tablet  Commonly known as:  Senokot-S      TAKE these medications       aspirin 81 MG EC tablet  Take 81 mg by mouth daily.     atorvastatin 40 MG tablet  Commonly known as:  LIPITOR  Take 1 tablet (40 mg total) by mouth daily.     cholecalciferol 1000 UNITS tablet  Commonly known as:  VITAMIN D  Take 5,000 Units by mouth daily.     ciprofloxacin 500 MG tablet  Commonly known as:  CIPRO  Take 1 tablet (500 mg total) by mouth 2 (two) times daily.     diclofenac 1.3 % Ptch  Commonly known as:  FLECTOR  Place 1 patch onto the skin 2 (two) times daily.     DULoxetine 60 MG capsule  Commonly known as:  CYMBALTA   Take 60 mg by mouth daily.     feeding supplement (ENSURE COMPLETE) Liqd  Take 237 mLs by mouth 3 (three) times daily between meals.     fluticasone 50 MCG/ACT nasal spray  Commonly known as:  FLONASE  Place 1 spray into both nostrils daily as needed.     Glutamine Powd  Take 10 g by mouth daily.     hydrocortisone 2.5 % rectal cream  Commonly known as:  ANUSOL-HC  Place rectally 2 (two) times daily as needed for hemorrhoids or itching.     HYDROmorphone 2 MG tablet  Commonly known as:  DILAUDID  Take by mouth every 6 (six) hours as needed for severe pain.     insulin glargine 100 UNIT/ML injection  Commonly known as:  LANTUS  Inject 10 Units into the skin at bedtime.     INVOKANA 100 MG Tabs  Generic drug:  Canagliflozin  Take 1 tablet by mouth daily.     lactulose 10 GM/15ML solution  Commonly known as:  CHRONULAC  Take 15 mLs (10 g total) by mouth 2 (two) times daily as needed for mild constipation or moderate constipation.     lidocaine-prilocaine cream  Commonly known as:  EMLA  Apply 1 application topically once.     metFORMIN 500 MG tablet  Commonly known as:  GLUCOPHAGE  Take 1 tablet by mouth daily.     metoprolol succinate 25 MG 24 hr tablet  Commonly known as:  TOPROL-XL  Take 1 tablet (25 mg total) by mouth daily.     morphine 30 MG 12 hr tablet  Commonly known as:  MS CONTIN  Take 30 mg by mouth every 12 (twelve) hours.     niacin 1000 MG CR tablet  Commonly known as:  NIASPAN  Take 1 tablet (1,000 mg total) by mouth at bedtime.     nitroGLYCERIN 0.4 MG SL tablet  Commonly known as:  NITROSTAT  Place 1 tablet (0.4 mg total) under the tongue every 5 (five) minutes as needed. May repeat up to 3 doses.     ondansetron 4 MG tablet  Commonly known as:  ZOFRAN  Take 4 mg by mouth every 8 (eight) hours as needed for nausea or vomiting.     pantoprazole 40 MG tablet  Commonly known as:  PROTONIX  Take 1 tablet (40 mg total) by mouth daily.      prochlorperazine 5 MG tablet  Commonly known as:  COMPAZINE  Take 5 mg by mouth every 6 (six) hours as needed for nausea or vomiting.     Zinc 10 MG Lozg  Use as directed 10 mg in the mouth or throat daily.       No Known Allergies     Follow-up Information   Follow up with Almedia Balls, MD. (in 1week, call for appt upon discharge)    Specialty:  Family Medicine   Contact information:   [address] [place] [address] [phone]       Please follow up. (oncologist in [place] as scheduled next week)        The results of significant diagnostics from this hospitalization (including imaging, microbiology, ancillary and laboratory) are listed below for reference.    Significant Diagnostic Studies: Dg Chest 1 View  05/04/2013   CLINICAL DATA:  Evaluate pneumothorax, shortness of breath and chest pain  EXAM: CHEST - 1 VIEW  COMPARISON:  Earlier same day; CT abdomen pelvis - earlier same day; 03/19/2013  FINDINGS: Grossly unchanged cardiac silhouette and mediastinal contours. Stable positioning of support apparatus. There has been minimal re-expansion of the right lung with persistent small right-sided hydro pneumothorax. No significant deviation of the cardiomediastinal structures. Possible minimal increase in the amount of right-sided pleural fluid. Small amount of left-sided pleural fluid appear grossly unchanged. Improved aeration of the right lower lung with persistent bibasilar heterogeneous opacities, right greater than left. No new focal airspace opacities. No definite evidence of edema. Grossly unchanged bones.  IMPRESSION: 1. Minimal re-expansion of the right lung with persistent small right-sided hydro pneumothorax. Given the presence of  a right basilar approach chest tube, this pneumothorax may be ex vacuo in etiology. Clinical correlation is advised. 2. Unchanged small left-sided pleural effusion. 3. Improved aeration of the right lung with persistent bibasilar  opacities, right greater than left, atelectasis versus infiltrate.   Electronically Signed   By: Simonne Come M.D.   On: 05/04/2013 14:55   Dg Chest 2 View  05/07/2013   CLINICAL DATA:  Shortness of breath, dyspnea  EXAM: CHEST  2 VIEW  COMPARISON:  05/06/2013; 05/05/2013; 05/04/2013  FINDINGS: Grossly unchanged cardiac silhouette and mediastinal contours. Stable positioning of support apparatus. Interval increase in now moderate amount of fluid within right-sided hydropneumothorax. The amount of air within the right pleural space is grossly unchanged. Stable positioning of right-sided chest tube.  Grossly unchanged small to moderate size left-sided effusion. Improved aeration of the bilateral lungs with persistent bibasilar opacities. No definite evidence of edema. Grossly unchanged bones.  IMPRESSION: 1. Stable positioning of support apparatus with interval increase in the now moderate amount of fluid within the right-sided possibly ex vacuo hydropneumothorax. 2. Grossly unchanged small to moderate size left-sided pleural effusion. 3. Improved aeration of lungs with persistent bibasilar heterogeneous/consolidative opacities, likely atelectasis.   Electronically Signed   By: Simonne Come M.D.   On: 05/07/2013 10:12   Ct Abdomen Pelvis W Contrast  05/04/2013   CLINICAL DATA:  Abdominal pain. Fever. Chills. Undergoing chemotherapy for pancreatic carcinoma.  EXAM: CT ABDOMEN AND PELVIS WITH CONTRAST  TECHNIQUE: Multidetector CT imaging of the abdomen and pelvis was performed using the standard protocol following bolus administration of intravenous contrast.  CONTRAST:  OMNIPAQUE IOHEXOL 300 MG/ML  SOLN  COMPARISON:  03/19/2013  FINDINGS: Right chest tube is now seen in place with a right basilar hydropneumothorax. Collapse or consolidation is seen in the right lower lung. New small to moderate left pleural effusion noted.  New moderate ascites is seen. Peritoneal enhancement is also noted, and omental caking  is again demonstrated, consistent with peritoneal carcinomatosis. No evidence of bowel obstruction.  Hypovascular mass in the pancreatic body is again seen which currently measures 4.1 x 3.8 cm on image 69 this is not significant change since previous study.  Probable tiny sub-cm hepatic cysts are stable. No definite liver masses are identified. The spleen, and adrenal glands are normal in appearance. Tiny renal cyst remains stable and there is no evidence of renal masses or hydronephrosis. No lymphadenopathy identified no evidence focal inflammatory process or bowel obstruction. No suspicious bone lesions identified.  IMPRESSION: New ascites and findings consistent with increased peritoneal carcinomatosis.  Large right basilar hydropneumothorax with right chest tube in place.  Collapse or consolidation in right lower lung. Infection or neoplasm cannot be excluded.  New small to moderate left pleural effusion.  Stable 4 cm hypovascular mass the pancreatic body, consistent with known primary pancreatic carcinoma.   Electronically Signed   By: Myles Rosenthal M.D.   On: 05/04/2013 14:52   US Abdomen Limited  05/05/2013   CLINICAL DATA:  Patient with recently diagnosed pancreatic cancer with possible metastatic disease. Abdominal pain. Ascites noted on CT of the abdomen  EXAM: US ABDOMEN LIMITED - RIGHT UPPER QUADRANT  COMPARISON:  CT of the abdomen on 05/04/2013.  FINDINGS: Limited ultrasound of the abdomen findings small volume of ascites in the perihepatic and perisplenic regions. These areas are not amenable for percutaneous paracentesis. There is also a potential collection of fluid in the low pelvis note is very difficult to differentiate from fluid  versus fluid-filled intestine. It was felt that this was not safe for percutaneous paracentesis.  IMPRESSION: Small volume perihepatic, perisplenic ascites not amenable for paracentesis. PROCEDURE not performed.  Read by: Brayton El PA-C   Electronically Signed    By: Maryclare Bean M.D.   On: 05/05/2013 15:55   Dg Chest Port 1 View  05/06/2013   CLINICAL DATA:  Pneumothorax.  EXAM: PORTABLE CHEST - 1 VIEW  COMPARISON:  05/05/2013.  FINDINGS: Port-A-Cath in stable anatomic position. Persistent prominent right pneumothorax. Patient's physician has previously been notified of this finding. Chest tube noted over right chest in stable position. Heart size is stable. Pulmonary infiltrate on the left has improved. Left-sided pleural effusion has increased in size. No acute osseous abnormality.  IMPRESSION: 1. Persistent right pneumothorax. Chest tube in stable position. Patient's physician has been previously notified of this finding. 2. Partial clearing of left pulmonary infiltrate. Developing left pleural effusion.   Electronically Signed   By: Maisie Fus  Register   On: 05/06/2013 07:26   Dg Chest Port 1 View  05/05/2013   CLINICAL DATA:  Followup pneumothorax.  EXAM: PORTABLE CHEST - 1 VIEW  COMPARISON:  05/04/2013  FINDINGS: Right IJ central venous catheter is unchanged. Small caliber right-sided chest tube has Jimmy over the medial right base unchanged. There is continued evidence of a moderate to large right-sided pneumothorax which has worsened compared to the recent prior exam from 05/04/2013 at 1443 although is not significantly changed from the prior exam 05/04/2013 11:28 a.m. There is continued hazy opacification over the right lower thorax likely layering pleural fluid slightly improved. There is mild worsening left perihilar and basilar opacification which may be due to interstitial edema. There is continued moderate consolidation over the inferior aspect of the collapsed right lung unchanged. Cardiomediastinal silhouette and remainder of the exam is unchanged.  IMPRESSION: Interval worsening of patient's moderate to large right-sided pneumothorax with small caliber chest tube in place and unchanged with Jimmy over the medial right base.  Slightly improved right pleural  fluid collection. Stable consolidation over the inferior aspect of the collapse right lung. Worsening left perihilar/ basilar opacification suggesting interstitial edema.  These results were called by telephone at the time of interpretation on 05/05/2013 at 7:31 AM to Dr. Renae Fickle , who verbally acknowledged these results.   Electronically Signed   By: Elberta Fortis M.D.   On: 05/05/2013 07:31   Dg Chest Port 1 View  05/04/2013   CLINICAL DATA:  Shortness of breath.  EXAM: PORTABLE CHEST - 1 VIEW  COMPARISON:  None.  FINDINGS: Right chest tube is noted of the right lower chest. A right pneumothorax is present. The pneumothorax is prominent and may be under tension. Atelectasis and effusion on the right noted. Port-A-Cath noted in good anatomic position. Small left pleural effusion. Mild left base atelectasis. Heart size normal.  IMPRESSION: 1. Prominent right pneumothorax with with probable tension. Right chest tube is noted over the right chest. 2. Bibasilar atelectasis with bilateral pleural effusions. Critical Value/emergent results were called by telephone at the time of interpretation on 05/04/2013 at 11:50 AM to Dr. Jaci Carrel , who verbally acknowledged these results.   Electronically Signed   By: Maisie Fus  Register   On: 05/04/2013 11:50    Microbiology: Recent Results (from the past 240 hour(s))  CULTURE, BLOOD (ROUTINE X 2)     Status: None   Collection Time    05/04/13 11:35 AM      Result Value Range Status  Specimen Description BLOOD LEFT HAND   Final   Special Requests BOTTLES DRAWN AEROBIC AND ANAEROBIC   Final   Culture  Setup Time     Final   Value: 05/04/2013 14:07     Performed at [facility]   Culture     Final   Value:        BLOOD CULTURE RECEIVED NO GROWTH TO DATE CULTURE WILL BE HELD FOR 5 DAYS BEFORE ISSUING A FINAL NEGATIVE REPORT     Performed at [facility]   Report Status PENDING   Incomplete  CULTURE, BLOOD (ROUTINE X 2)      Status: None   Collection Time    05/04/13 11:40 AM      Result Value Range Status   Specimen Description BLOOD RIGHT HAND   Final   Special Requests BOTTLES DRAWN AEROBIC ONLY   Final   Culture  Setup Time     Final   Value: 05/04/2013 14:07     Performed at [facility]   Culture     Final   Value:        BLOOD CULTURE RECEIVED NO GROWTH TO DATE CULTURE WILL BE HELD FOR 5 DAYS BEFORE ISSUING A FINAL NEGATIVE REPORT     Performed at [facility]   Report Status PENDING   Incomplete  BODY FLUID CULTURE     Status: None   Collection Time    05/04/13  2:08 PM      Result Value Range Status   Specimen Description PLEURAL   Final   Special Requests Immunocompromised   Final   Gram Stain     Final   Value: CYTOSPIN FEW WBC PRESENT,BOTH PMN AND MONONUCLEAR     NO ORGANISMS SEEN     Performed by [facility] Gram Stain Report Called to,Read Back By and Verified With: Gram Stain Report Called to,Read Back By and Verified With:  Lorita Officer RN AT 1546 ON01/19/15 BY A NAVARRO     Performed at [facility]   Culture     Final   Value: NO GROWTH 2 DAYS     Performed at [facility]   Report Status PENDING   Incomplete  GRAM STAIN     Status: None   Collection Time    05/04/13  2:08 PM      Result Value Range Status   Specimen Description PLEURAL   Final   Special Requests Immunocompromised   Final   Gram Stain     Final   Value: CYTOSPIN     FEW WBC PRESENT,BOTH PMN AND MONONUCLEAR     NO ORGANISMS SEEN     Gram Stain Report Called to,Read Back By and Verified With: J BEAN RN [phone] 05/04/13 A NAVARRO   Report Status 05/04/2013 FINAL   Final  MRSA PCR SCREENING     Status: None   Collection Time    05/04/13  5:23 PM      Result Value Range Status   MRSA by PCR NEGATIVE  NEGATIVE Final   Comment:            The GeneXpert MRSA Assay (FDA     approved for NASAL specimens     only), is one component of a     comprehensive MRSA colonization      surveillance program. It is not     intended to diagnose MRSA     infection nor to  guide or     monitor treatment for     MRSA infections.  CLOSTRIDIUM DIFFICILE BY PCR     Status: None   Collection Time    05/06/13 12:36 AM      Result Value Range Status   C difficile by pcr NEGATIVE  NEGATIVE Final   Comment: Performed at [facility]     Status: None   Collection Time    05/06/13 12:36 AM      Result Value Range Status   Specimen Description STOOL   Final   Special Requests Normal   Final   Culture     Final   Value: NO SUSPICIOUS COLONIES, CONTINUING TO HOLD     Performed at [facility]   Report Status PENDING   Incomplete  URINE CULTURE     Status: None   Collection Time    05/06/13 11:16 AM      Result Value Range Status   Specimen Description URINE, CLEAN CATCH   Final   Special Requests NONE   Final   Culture  Setup Time     Final   Value: 05/06/2013 13:47     Performed at [place]     Final   Value: NO GROWTH     Performed at [facility]   Culture     Final   Value: NO GROWTH     Performed at [facility]   Report Status 05/07/2013 FINAL   Final     Labs: Basic Metabolic Panel:  Recent Labs Lab 05/04/13 1135 05/04/13 1820 05/07/13 0500 05/08/13 0500  NA 132* 131* 127* 129*  K 4.2 4.4 3.7 4.1  CL 93* 90* 90* 90*  CO2 17* $Remov'25 27 26  'DVbUhd$ GLUCOSE 157* 155* 133* 134*  BUN $Re'17 16 15 13  'YNH$ CREATININE 0.61 0.65 0.55 0.54  CALCIUM 8.4 8.3* 7.9* 8.3*   Liver Function Tests:  Recent Labs Lab 05/04/13 1135  AST 28  ALT 24  ALKPHOS 75  BILITOT 0.6  PROT 5.2*  ALBUMIN 2.0*    Recent Labs Lab 05/04/13 1135  LIPASE 8*   No results found for this basename: AMMONIA,  in the last 168 hours CBC:  Recent Labs Lab 05/02/13 1130 05/04/13 1135 05/07/13 0500  WBC 5.8 4.0 0.6*  NEUTROABS  --  3.5  --   HGB 11.7* 15.5 10.9*  HCT 35.1* 44.7 32.6*  MCV 86.2 84.2 84.5  PLT 208 216  115*   Cardiac Enzymes:  Recent Labs Lab 05/04/13 1135  TROPONINI <0.30   BNP: BNP (last 3 results)  Recent Labs  05/04/13 1135  PROBNP 542.0*   CBG:  Recent Labs Lab 05/07/13 0753 05/07/13 1159 05/07/13 1646 05/07/13 2137 05/08/13 0757  GLUCAP 102* 92 109* 121* 123*       Signed:  Dora Simeone C  [facility] 05/08/2013, 10:34 AM

## 2013-05-08 NOTE — Progress Notes (Addendum)
Patient Jimmy Christensen      DOB: 11/29/1955      SXQ:[id number]   Palliative Medicine Team at [facility] Progress Note    Subjective:  Met with patient and spouse.  Pain improved but not perfect.  Updated them on changes to medication and will call the [place].  Provided disk with imaging for his appointment next week. Pain meds adequate and will remain the same including new diclofenac patch.      Filed Vitals:   05/08/13 1000  BP: 98/50  Pulse:   Temp:   Resp: 14   Physical exam:   defered  Assessment and plan: 58 yr old white male with metastatic pancreatic cancer admitted with persistent pneumothorax after having a pleurx catheter placed, treated for HCap.  [facility]  1.  Full code 2.  Pain: added diclofenac and resumed home diluadid and long acting morphine.  I will update his pain specialist on medications changes. Greater than 50% of this time was spent coordinating care for discharge.   Total time 15 min  Kenzie Flakes L. Lovena Le, MD MBA The Palliative Medicine Team at [facility] Phone: [phone] Pager: [phone]

## 2013-05-08 NOTE — Progress Notes (Addendum)
Pt discharged home via family; Pt and family given and explained all discharge instructions, carenotes, and prescriptions; pt and family stated understanding and denied questions/concerns; all f/u appointments in place; IV removed without complicaitons; pt stable at time of discharge   Pt given radiology disk to take to [place]; instructed family to make PCP appt for follow up chest xray by Monday prior to trip to [place];

## 2013-05-08 NOTE — Care Management Note (Signed)
    Page 1 of 1   05/08/2013     11:11:57 AM   CARE MANAGEMENT NOTE 05/08/2013  Patient:  Jimmy Christensen, Jimmy Christensen   Account Number:  [id number]  Date Initiated:  05/05/2013  Documentation initiated by:  DAVIS,RHONDA  Subjective/Objective Assessment:   pt with hx of pancreatitis and liver ca now with enlarging pneumothorax and hypotension.     Action/Plan:   pt is amenable to palliative care consult but still wants aggressive care.   Anticipated DC Date:  05/08/2013   Anticipated DC Plan:  [place]  CM consult      Choice offered to / List presented to:  C-1 Patient        [place] arranged  [place] [facility]      [place].   Status of service:  In process, will continue to follow Medicare Important Message given?  NA - LOS <3 / Initial given by admissions (If response is "NO", the following Medicare IM given date fields will be blank) Date Medicare IM given:   Date Additional Medicare IM given:    Discharge Disposition:  [place]  Per UR Regulation:  Reviewed for med. necessity/level of care/duration of stay  If discussed at [place] of Stay Meetings, dates discussed:    Comments:  05/08/13 Allene Dillon RN BSN Rferral made to [facility] for [place] services.

## 2013-05-09 LAB — STOOL CULTURE: SPECIAL REQUESTS: NORMAL

## 2013-05-10 LAB — CULTURE, BLOOD (ROUTINE X 2)
CULTURE: NO GROWTH
Culture: NO GROWTH

## 2013-05-11 ENCOUNTER — Inpatient Hospital Stay (HOSPITAL_COMMUNITY)
Admission: EM | Admit: 2013-05-11 | Discharge: 2013-05-15 | DRG: 175 | Disposition: A | Discharge status: Home Health | Payer: BC Managed Care – PPO | Attending: Internal Medicine | Admitting: Internal Medicine

## 2013-05-11 ENCOUNTER — Encounter (HOSPITAL_COMMUNITY): Payer: Self-pay | Admitting: Emergency Medicine

## 2013-05-11 ENCOUNTER — Emergency Department (HOSPITAL_COMMUNITY): Payer: BC Managed Care – PPO

## 2013-05-11 DIAGNOSIS — D72829 Elevated white blood cell count, unspecified: Secondary | ICD-10-CM | POA: Diagnosis present

## 2013-05-11 DIAGNOSIS — E785 Hyperlipidemia, unspecified: Secondary | ICD-10-CM | POA: Diagnosis present

## 2013-05-11 DIAGNOSIS — E43 Unspecified severe protein-calorie malnutrition: Secondary | ICD-10-CM | POA: Diagnosis present

## 2013-05-11 DIAGNOSIS — I252 Old myocardial infarction: Secondary | ICD-10-CM

## 2013-05-11 DIAGNOSIS — D630 Anemia in neoplastic disease: Secondary | ICD-10-CM | POA: Diagnosis present

## 2013-05-11 DIAGNOSIS — Z8249 Family history of ischemic heart disease and other diseases of the circulatory system: Secondary | ICD-10-CM

## 2013-05-11 DIAGNOSIS — E119 Type 2 diabetes mellitus without complications: Secondary | ICD-10-CM | POA: Diagnosis present

## 2013-05-11 DIAGNOSIS — E871 Hypo-osmolality and hyponatremia: Secondary | ICD-10-CM | POA: Diagnosis present

## 2013-05-11 DIAGNOSIS — Z79899 Other long term (current) drug therapy: Secondary | ICD-10-CM

## 2013-05-11 DIAGNOSIS — IMO0002 Reserved for concepts with insufficient information to code with codable children: Secondary | ICD-10-CM

## 2013-05-11 DIAGNOSIS — Z791 Long term (current) use of non-steroidal anti-inflammatories (NSAID): Secondary | ICD-10-CM

## 2013-05-11 DIAGNOSIS — R188 Other ascites: Secondary | ICD-10-CM | POA: Diagnosis present

## 2013-05-11 DIAGNOSIS — E663 Overweight: Secondary | ICD-10-CM | POA: Diagnosis present

## 2013-05-11 DIAGNOSIS — C259 Malignant neoplasm of pancreas, unspecified: Secondary | ICD-10-CM

## 2013-05-11 DIAGNOSIS — Z6825 Body mass index (BMI) 25.0-25.9, adult: Secondary | ICD-10-CM

## 2013-05-11 DIAGNOSIS — J90 Pleural effusion, not elsewhere classified: Secondary | ICD-10-CM | POA: Diagnosis present

## 2013-05-11 DIAGNOSIS — J939 Pneumothorax, unspecified: Secondary | ICD-10-CM

## 2013-05-11 DIAGNOSIS — D638 Anemia in other chronic diseases classified elsewhere: Secondary | ICD-10-CM | POA: Diagnosis present

## 2013-05-11 DIAGNOSIS — I10 Essential (primary) hypertension: Secondary | ICD-10-CM | POA: Diagnosis present

## 2013-05-11 DIAGNOSIS — I2699 Other pulmonary embolism without acute cor pulmonale: Principal | ICD-10-CM | POA: Diagnosis present

## 2013-05-11 DIAGNOSIS — Z800 Family history of malignant neoplasm of digestive organs: Secondary | ICD-10-CM

## 2013-05-11 DIAGNOSIS — I2510 Atherosclerotic heart disease of native coronary artery without angina pectoris: Secondary | ICD-10-CM | POA: Diagnosis present

## 2013-05-11 DIAGNOSIS — Z7982 Long term (current) use of aspirin: Secondary | ICD-10-CM

## 2013-05-11 DIAGNOSIS — G893 Neoplasm related pain (acute) (chronic): Secondary | ICD-10-CM | POA: Diagnosis present

## 2013-05-11 DIAGNOSIS — J910 Malignant pleural effusion: Secondary | ICD-10-CM | POA: Diagnosis present

## 2013-05-11 DIAGNOSIS — Z87891 Personal history of nicotine dependence: Secondary | ICD-10-CM

## 2013-05-11 DIAGNOSIS — Z794 Long term (current) use of insulin: Secondary | ICD-10-CM

## 2013-05-11 DIAGNOSIS — J9383 Other pneumothorax: Secondary | ICD-10-CM

## 2013-05-11 DIAGNOSIS — R0602 Shortness of breath: Secondary | ICD-10-CM

## 2013-05-11 LAB — CBC WITH DIFFERENTIAL/PLATELET
BAND NEUTROPHILS: 0 % (ref 0–10)
BASOS PCT: 0 % (ref 0–1)
Basophils Absolute: 0 10*3/uL (ref 0.0–0.1)
Blasts: 0 %
EOS PCT: 0 % (ref 0–5)
Eosinophils Absolute: 0 10*3/uL (ref 0.0–0.7)
HCT: 35.4 % — ABNORMAL LOW (ref 39.0–52.0)
HEMOGLOBIN: 12.4 g/dL — AB (ref 13.0–17.0)
LYMPHS ABS: 4.9 10*3/uL — AB (ref 0.7–4.0)
LYMPHS PCT: 25 % (ref 12–46)
MCH: 29 pg (ref 26.0–34.0)
MCHC: 35 g/dL (ref 30.0–36.0)
MCV: 82.9 fL (ref 78.0–100.0)
MONO ABS: 2 10*3/uL — AB (ref 0.1–1.0)
MONOS PCT: 10 % (ref 3–12)
Metamyelocytes Relative: 0 %
Myelocytes: 0 %
Neutro Abs: 12.7 10*3/uL — ABNORMAL HIGH (ref 1.7–7.7)
Neutrophils Relative %: 65 % (ref 43–77)
Platelets: 302 10*3/uL (ref 150–400)
Promyelocytes Absolute: 0 %
RBC: 4.27 MIL/uL (ref 4.22–5.81)
RDW: 14.8 % (ref 11.5–15.5)
WBC: 19.6 10*3/uL — ABNORMAL HIGH (ref 4.0–10.5)
nRBC: 0 /100 WBC

## 2013-05-11 LAB — GLUCOSE, CAPILLARY: Glucose-Capillary: 91 mg/dL (ref 70–99)

## 2013-05-11 LAB — COMPREHENSIVE METABOLIC PANEL
ALT: 21 U/L (ref 0–53)
AST: 29 U/L (ref 0–37)
Albumin: 1.7 g/dL — ABNORMAL LOW (ref 3.5–5.2)
Alkaline Phosphatase: 96 U/L (ref 39–117)
BUN: 14 mg/dL (ref 6–23)
CALCIUM: 8.6 mg/dL (ref 8.4–10.5)
CO2: 24 mEq/L (ref 19–32)
CREATININE: 0.53 mg/dL (ref 0.50–1.35)
Chloride: 90 mEq/L — ABNORMAL LOW (ref 96–112)
GFR calc Af Amer: >90 mL/min (ref >90)
Glucose, Bld: 116 mg/dL — ABNORMAL HIGH (ref 70–99)
Potassium: 4.1 mEq/L (ref 3.7–5.3)
Sodium: 129 mEq/L — ABNORMAL LOW (ref 137–147)
Total Bilirubin: 0.5 mg/dL (ref 0.3–1.2)
Total Protein: 5.4 g/dL — ABNORMAL LOW (ref 6.0–8.3)

## 2013-05-11 LAB — TROPONIN I: Troponin I: <0.3 ng/mL (ref <0.30)

## 2013-05-11 LAB — PROTIME-INR
INR: 1.16 (ref 0.00–1.49)
Prothrombin Time: 14.6 seconds (ref 11.6–15.2)

## 2013-05-11 LAB — PHOSPHORUS: PHOSPHORUS: 2.7 mg/dL (ref 2.3–4.6)

## 2013-05-11 LAB — MAGNESIUM: Magnesium: 1.6 mg/dL (ref 1.5–2.5)

## 2013-05-11 LAB — D-DIMER, QUANTITATIVE: D-Dimer, Quant: 3.56 ug/mL-FEU — ABNORMAL HIGH (ref 0.00–0.48)

## 2013-05-11 LAB — APTT: APTT: 37 s (ref 24–37)

## 2013-05-11 MED ORDER — ENOXAPARIN SODIUM 100 MG/ML ~~LOC~~ SOLN
1.0000 mg/kg | Freq: Two times a day (BID) | SUBCUTANEOUS | Status: DC
  Administered 2013-05-12 – 2013-05-15 (×7): 85 mg via SUBCUTANEOUS
  Filled 2013-05-11 (×9): qty 1

## 2013-05-11 MED ORDER — HYDROMORPHONE HCL 2 MG PO TABS
2.0000 mg | ORAL_TABLET | Freq: Four times a day (QID) | ORAL | Status: DC | PRN
  Administered 2013-05-11 – 2013-05-15 (×5): 2 mg via ORAL
  Filled 2013-05-11 (×5): qty 1

## 2013-05-11 MED ORDER — HYDROCORTISONE 2.5 % RE CREA
TOPICAL_CREAM | Freq: Two times a day (BID) | RECTAL | Status: DC | PRN
  Filled 2013-05-11: qty 28.35

## 2013-05-11 MED ORDER — LACTULOSE 10 GM/15ML PO SOLN
10.0000 g | Freq: Two times a day (BID) | ORAL | Status: DC | PRN
  Filled 2013-05-11: qty 15

## 2013-05-11 MED ORDER — ENOXAPARIN SODIUM 100 MG/ML ~~LOC~~ SOLN
90.0000 mg | Freq: Once | SUBCUTANEOUS | Status: AC
  Administered 2013-05-11: 90 mg via SUBCUTANEOUS
  Filled 2013-05-11: qty 1

## 2013-05-11 MED ORDER — ENSURE COMPLETE PO LIQD
237.0000 mL | Freq: Three times a day (TID) | ORAL | Status: DC
  Administered 2013-05-11 – 2013-05-15 (×12): 237 mL via ORAL

## 2013-05-11 MED ORDER — NIACIN ER (ANTIHYPERLIPIDEMIC) 500 MG PO TBCR
1000.0000 mg | EXTENDED_RELEASE_TABLET | Freq: Every day | ORAL | Status: DC
  Administered 2013-05-11 – 2013-05-14 (×4): 1000 mg via ORAL
  Filled 2013-05-11 (×5): qty 2

## 2013-05-11 MED ORDER — ACETAMINOPHEN 325 MG PO TABS: 650.0000 mg | ORAL_TABLET | Freq: Four times a day (QID) | ORAL | Status: DC | PRN

## 2013-05-11 MED ORDER — DICLOFENAC EPOLAMINE 1.3 % TD PTCH
1.0000 | MEDICATED_PATCH | Freq: Two times a day (BID) | TRANSDERMAL | Status: DC
  Administered 2013-05-11 – 2013-05-15 (×8): 1 via TRANSDERMAL
  Filled 2013-05-11 (×18): qty 1

## 2013-05-11 MED ORDER — SODIUM CHLORIDE 0.9 % IJ SOLN
10.0000 mL | Freq: Two times a day (BID) | INTRAMUSCULAR | Status: DC
  Administered 2013-05-13 – 2013-05-14 (×2): 10 mL

## 2013-05-11 MED ORDER — INSULIN GLARGINE 100 UNIT/ML ~~LOC~~ SOLN
10.0000 [IU] | Freq: Every day | SUBCUTANEOUS | Status: DC
  Administered 2013-05-11 – 2013-05-14 (×4): 10 [IU] via SUBCUTANEOUS
  Filled 2013-05-11 (×5): qty 0.1

## 2013-05-11 MED ORDER — SODIUM CHLORIDE 0.9 % IV SOLN
INTRAVENOUS | Status: DC
  Administered 2013-05-11 – 2013-05-12 (×3): via INTRAVENOUS

## 2013-05-11 MED ORDER — CIPROFLOXACIN HCL 500 MG PO TABS
500.0000 mg | ORAL_TABLET | Freq: Two times a day (BID) | ORAL | Status: DC
  Administered 2013-05-11 – 2013-05-15 (×8): 500 mg via ORAL
  Filled 2013-05-11 (×10): qty 1

## 2013-05-11 MED ORDER — PANTOPRAZOLE SODIUM 40 MG PO TBEC
40.0000 mg | DELAYED_RELEASE_TABLET | Freq: Every day | ORAL | Status: DC
  Administered 2013-05-12 – 2013-05-15 (×4): 40 mg via ORAL
  Filled 2013-05-11 (×4): qty 1

## 2013-05-11 MED ORDER — SODIUM CHLORIDE 0.9 % IJ SOLN
10.0000 mL | INTRAMUSCULAR | Status: DC | PRN
  Administered 2013-05-11 – 2013-05-13 (×3): 10 mL

## 2013-05-11 MED ORDER — VITAMIN D3 25 MCG (1000 UNIT) PO TABS
5000.0000 [IU] | ORAL_TABLET | Freq: Every day | ORAL | Status: DC
  Administered 2013-05-12 – 2013-05-15 (×4): 5000 [IU] via ORAL
  Filled 2013-05-11 (×4): qty 5

## 2013-05-11 MED ORDER — PROCHLORPERAZINE MALEATE 5 MG PO TABS
5.0000 mg | ORAL_TABLET | Freq: Four times a day (QID) | ORAL | Status: DC | PRN
  Filled 2013-05-11: qty 1

## 2013-05-11 MED ORDER — CANAGLIFLOZIN 100 MG PO TABS
1.0000 | ORAL_TABLET | Freq: Every day | ORAL | Status: DC
  Administered 2013-05-12 – 2013-05-15 (×4): 100 mg via ORAL
  Filled 2013-05-11 (×4): qty 1

## 2013-05-11 MED ORDER — ATORVASTATIN CALCIUM 40 MG PO TABS
40.0000 mg | ORAL_TABLET | Freq: Every day | ORAL | Status: DC
  Administered 2013-05-11 – 2013-05-15 (×5): 40 mg via ORAL
  Filled 2013-05-11 (×6): qty 1

## 2013-05-11 MED ORDER — ZINC 10 MG MT LOZG
10.0000 mg | LOZENGE | Freq: Every day | OROMUCOSAL | Status: DC
  Administered 2013-05-13: 10 mg via OROMUCOSAL

## 2013-05-11 MED ORDER — FLUTICASONE PROPIONATE 50 MCG/ACT NA SUSP
1.0000 | Freq: Every day | NASAL | Status: DC | PRN
  Filled 2013-05-11: qty 16

## 2013-05-11 MED ORDER — ONDANSETRON HCL 4 MG PO TABS: 4.0000 mg | ORAL_TABLET | Freq: Three times a day (TID) | ORAL | Status: DC | PRN

## 2013-05-11 MED ORDER — DULOXETINE HCL 60 MG PO CPEP
60.0000 mg | ORAL_CAPSULE | Freq: Every day | ORAL | Status: DC
  Administered 2013-05-12 – 2013-05-15 (×4): 60 mg via ORAL
  Filled 2013-05-11 (×6): qty 1

## 2013-05-11 MED ORDER — IPRATROPIUM BROMIDE 0.02 % IN SOLN
0.5000 mg | RESPIRATORY_TRACT | Status: DC | PRN
  Administered 2013-05-11: 0.5 mg via RESPIRATORY_TRACT
  Filled 2013-05-11: qty 2.5

## 2013-05-11 MED ORDER — HYDROMORPHONE HCL PF 1 MG/ML IJ SOLN
1.0000 mg | INTRAMUSCULAR | Status: DC | PRN
  Administered 2013-05-11 – 2013-05-15 (×9): 1 mg via INTRAVENOUS
  Filled 2013-05-11 (×9): qty 1

## 2013-05-11 MED ORDER — ALBUTEROL SULFATE (2.5 MG/3ML) 0.083% IN NEBU
2.5000 mg | INHALATION_SOLUTION | RESPIRATORY_TRACT | Status: DC | PRN
  Administered 2013-05-11: 2.5 mg via RESPIRATORY_TRACT
  Filled 2013-05-11: qty 3

## 2013-05-11 MED ORDER — MORPHINE SULFATE ER 30 MG PO TBCR
30.0000 mg | EXTENDED_RELEASE_TABLET | Freq: Two times a day (BID) | ORAL | Status: DC
  Administered 2013-05-11 – 2013-05-15 (×8): 30 mg via ORAL
  Filled 2013-05-11 (×8): qty 1

## 2013-05-11 MED ORDER — ACETAMINOPHEN 650 MG RE SUPP: 650.0000 mg | Freq: Four times a day (QID) | RECTAL | Status: DC | PRN

## 2013-05-11 MED ORDER — ASPIRIN EC 81 MG PO TBEC
81.0000 mg | DELAYED_RELEASE_TABLET | Freq: Every day | ORAL | Status: DC
  Administered 2013-05-12 – 2013-05-15 (×4): 81 mg via ORAL
  Filled 2013-05-11 (×5): qty 1

## 2013-05-11 MED ORDER — METFORMIN HCL 500 MG PO TABS
500.0000 mg | ORAL_TABLET | Freq: Every day | ORAL | Status: DC
  Administered 2013-05-12 – 2013-05-15 (×4): 500 mg via ORAL
  Filled 2013-05-11 (×5): qty 1

## 2013-05-11 MED ORDER — SODIUM CHLORIDE 0.9 % IJ SOLN: 3.0000 mL | Freq: Two times a day (BID) | INTRAMUSCULAR | Status: DC

## 2013-05-11 MED ORDER — METOPROLOL SUCCINATE ER 25 MG PO TB24
25.0000 mg | ORAL_TABLET | Freq: Every day | ORAL | Status: DC
  Administered 2013-05-12 – 2013-05-15 (×4): 25 mg via ORAL
  Filled 2013-05-11 (×4): qty 1

## 2013-05-11 MED ORDER — IOHEXOL 350 MG/ML SOLN
100.0000 mL | Freq: Once | INTRAVENOUS | Status: AC | PRN
  Administered 2013-05-11: 100 mL via INTRAVENOUS

## 2013-05-11 MED ORDER — MORPHINE SULFATE 4 MG/ML IJ SOLN
4.0000 mg | Freq: Once | INTRAMUSCULAR | Status: AC
  Administered 2013-05-11: 4 mg via INTRAVENOUS
  Filled 2013-05-11: qty 1

## 2013-05-11 MED ORDER — LIDOCAINE-PRILOCAINE 2.5-2.5 % EX CREA
1.0000 "application " | TOPICAL_CREAM | Freq: Once | CUTANEOUS | Status: DC
  Filled 2013-05-11: qty 5

## 2013-05-11 NOTE — H&P (Signed)
[facility] History and Physical  Jimmy Christensen [id number] DOB: May 10, 1955 DOA: 05/11/2013  Referring physician: ER physician PCP: [name], MD   Chief Complaint: shortness of breath  HPI:  58 year old male with past medical history of pancreatic cancer (undergoing treatment at cancer center in [place] but was seen previously by Dr. [name]) and related malignant pleural effusion with right side pleur-x catheter placement 1 week prior to previous admission, recent admission (on 05/04/2013 for trapped lung due to malignant pleural effusion), hypertension, DM who presented to [facility] ED 05/11/2013 with complaints of worsening shortness of breath for past 24 hours prior to this admission. No associated fever or chills. No significant cough reported. He does experience generalized pain which includes the chest area but this is ongoing. His home medications did not provide significant pain relief.  In ED, BP was 95/50 and has improved to 108/82 with IV fluids. HR was 100 - 110, Tmax 98.1 F and oxygen saturation 93% on 2 L nasal canula oxygen support. Blood work revealed Hgb of 12.4 and sodium of 129. CT angio chest was suspicious for small pulmonary embolism. He was started on Lovenox in ED for anticoagulation.   Assessment and Plan:  Principal Problem:   Pulmonary embolus - likely secondary to history of malignancy - started on Lovenox per pharmacy protocol - oxygen support via nasal canula to keep O2 saturation above 90% - BD as needed - pain regimen with dilaudid 2 mg IV every 6 hours PRN Active Problems:   DYSLIPIDEMIA - continue statin therapy   CAD (coronary artery disease) - continue aspirin   Pancreatic cancer with malignant pleural effusions and ascites - management per oncology - based on last admission follow up was supposed to be in [place]   Hyponatremia - likely dehydration versus malignant pleural effusion - continue to monitor    Diabetes mellitus type 2  - continue  canaglifozin 100 mg PO daily - continue lantus 10 units at bedtime    Hypertension - continue metoprolol 25 mg daily   Protein-calorie malnutrition, severe - secondary to history of pancreatic cancer - nutrition consulted  - continue ensure supplements   Anemia of chronic disease - secondary to history of malignancy - continue to monitor CBC - hemoglobin on admission 12.4 - no indications for transfusion at this time   Leukocytosis, unspecified - likely from acute pulmonary embolism - defer antibiotic treatment for now    Cancer related pain - continue home long acting meds: MS contin 30 mg PO Q 12 hours - added dilaudid 2 mg IV every 6 hours PRN - adjuvant therapy: cymbalta  Radiological Exams on Admission: Dg Chest 2 View 05/11/2013    IMPRESSION: 1. The right hydropneumothorax on the right is unchanged and likely occupies at least 50-60% of the lung volume. The right-sided small caliber pleural drainage tube is stable in position. 2. A small left pleural effusion is not significantly changed from the previous study.   Electronically Signed   By: David  Martinique   On: 05/11/2013 14:34   Ct Angio Chest Pe W/cm &/or Wo Cm 05/11/2013    IMPRESSION: 1. Possible small pulmonary embolism identified in a left lower lobe posterior basilar segmental branch. 2. Persistent "trapped" appearance of the right lung with lack of expansion despite previous pleural drain placement. There is a large residual hydropneumothorax.   Electronically Signed   By: Aletta Edouard M.D.   On: 05/11/2013 15:35     Code Status: Full Family Communication: Pt at  bedside Disposition Plan: Admit for further evaluation  Leisa Lenz, MD  [facility] Pager [phone]  Review of Systems:  Constitutional: Negative for fever, chills and malaise/fatigue. Negative for diaphoresis.  HENT: Negative for hearing loss, ear pain, nosebleeds, congestion, sore throat, neck pain, tinnitus and ear discharge.   Eyes: Negative  for blurred vision, double vision, photophobia, pain, discharge and redness.  Respiratory: Negative for cough, hemoptysis, sputum production, positive for shortness of breath, no wheezing and stridor.   Cardiovascular: Negative for chest pain, palpitations, orthopnea, claudication and leg swelling.  Gastrointestinal: per HPI Genitourinary: Negative for dysuria, urgency, frequency, hematuria and flank pain.  Musculoskeletal: Negative for myalgias, back pain, joint pain and falls.  Skin: Negative for itching and rash.  Neurological: Negative for dizziness and positive for weakness. Negative for tingling, tremors, sensory change, speech change, focal weakness, loss of consciousness and headaches.  Endo/Heme/Allergies: Negative for environmental allergies and polydipsia. Does not bruise/bleed easily.  Psychiatric/Behavioral: Negative for suicidal ideas. The patient is not nervous/anxious.      Past Medical History  Diagnosis Date  . Overweight   . Dyslipidemia   . CAD (coronary artery disease)     [name] (DES) stent 2006; Nuclear stress 05/08, EF 62%, no ischemia  . Ejection fraction     50% catheterization 2006 /  60% nuclear, 2008  . MI (myocardial infarction)   . Kidney stones 1981  . Carotid artery disease     Doppler, May, 2013, 0-39% bilateral  . Ventral hernia     First noted may, 2014  . Pancreatic cancer   . Constipation   . Chemotherapy-induced nausea   . Pleural effusion on right   . Diabetes mellitus type 2 in nonobese   . Cataract    Past Surgical History  Procedure Laterality Date  . Cardiac catheterization  2006    EF 50%; 62% nuclear 2008  . Kidney stone surgery    . Ankle surgery  right  . Coronary angioplasty    . Insertion / placement pleural catheter     Social History:  reports that he has quit smoking. His smoking use included Cigarettes. He has a 25 pack-year smoking history. He has never used smokeless tobacco. He reports that he drinks alcohol. He  reports that he does not use illicit drugs.  No Known Allergies  Family History  Problem Relation Age of Onset  . Liver cancer Sister   . Cancer Paternal Grandmother   . Heart disease Mother   . Heart disease Father   . Stroke Sister   . Stroke Paternal Grandfather      Prior to Admission medications   Medication Sig Start Date End Date Taking? Authorizing Provider  aspirin 81 MG EC tablet Take 81 mg by mouth daily.     Yes Historical Provider, MD  atorvastatin (LIPITOR) 40 MG tablet Take 1 tablet (40 mg total) by mouth daily. 09/25/12 10/30/13 Yes Carlena Bjornstad, MD  Canagliflozin [facility]) 100 MG TABS Take 1 tablet by mouth daily.   Yes Historical Provider, MD  cholecalciferol (VITAMIN D) 1000 UNITS tablet Take 5,000 Units by mouth daily.   Yes Historical Provider, MD  ciprofloxacin (CIPRO) 500 MG tablet Take 1 tablet (500 mg total) by mouth 2 (two) times daily. 05/08/13  Yes Adeline Saralyn Pilar, MD  DULoxetine (CYMBALTA) 60 MG capsule Take 60 mg by mouth daily.   Yes Historical Provider, MD  feeding supplement, ENSURE COMPLETE, (ENSURE COMPLETE) LIQD Take 237 mLs by mouth 3 (three)  times daily between meals. 05/08/13  Yes Adeline C Viyuoh, MD  fluticasone (FLONASE) 50 MCG/ACT nasal spray Place 1 spray into both nostrils daily as needed.  03/14/13  Yes Historical Provider, MD  hydrocortisone (ANUSOL-HC) 2.5 % rectal cream Place rectally 2 (two) times daily as needed for hemorrhoids or itching. 05/08/13  Yes Adeline C Viyuoh, MD  HYDROmorphone (DILAUDID) 2 MG tablet Take by mouth every 6 (six) hours as needed for severe pain.   Yes Historical Provider, MD  insulin glargine (LANTUS) 100 UNIT/ML injection Inject 10 Units into the skin at bedtime.    Yes Historical Provider, MD  lactulose (CHRONULAC) 10 GM/15ML solution Take 15 mLs (10 g total) by mouth 2 (two) times daily as needed for mild constipation or moderate constipation. 05/08/13  Yes Adeline C Viyuoh, MD  lidocaine-prilocaine (EMLA)  cream Apply 1 application topically once.   Yes Historical Provider, MD  metFORMIN (GLUCOPHAGE) 500 MG tablet Take 1 tablet by mouth daily. 02/18/13  Yes Historical Provider, MD  metoprolol succinate (TOPROL-XL) 25 MG 24 hr tablet Take 1 tablet (25 mg total) by mouth daily. 09/25/12 10/30/13 Yes Carlena Bjornstad, MD  morphine (MS CONTIN) 30 MG 12 hr tablet Take 30 mg by mouth every 12 (twelve) hours.   Yes Historical Provider, MD  niacin (NIASPAN) 1000 MG CR tablet Take 1 tablet (1,000 mg total) by mouth at bedtime. 09/24/12 10/29/13 Yes Carlena Bjornstad, MD  ondansetron (ZOFRAN) 4 MG tablet Take 4 mg by mouth every 8 (eight) hours as needed for nausea or vomiting.   Yes Historical Provider, MD  pantoprazole (PROTONIX) 40 MG tablet Take 1 tablet (40 mg total) by mouth daily. 06/13/12 06/13/13 Yes Carlena Bjornstad, MD  prochlorperazine (COMPAZINE) 5 MG tablet Take 5 mg by mouth every 6 (six) hours as needed for nausea or vomiting.   Yes Historical Provider, MD  Zinc 10 MG LOZG Use as directed 10 mg in the mouth or throat daily.   Yes Historical Provider, MD  diclofenac (FLECTOR) 1.3 % PTCH Place 1 patch onto the skin 2 (two) times daily. 05/08/13   Sheila Oats, MD  nitroGLYCERIN (NITROSTAT) 0.4 MG SL tablet Place 1 tablet (0.4 mg total) under the tongue every 5 (five) minutes as needed. May repeat up to 3 doses. 08/08/11   Carlena Bjornstad, MD   Physical Exam: Filed Vitals:   05/11/13 1308 05/11/13 1624 05/11/13 1757 05/11/13 1810  BP: 108/62 95/50 106/70   Pulse: 110 100    Temp: 97.6 F (36.4 C)  98.1 F (36.7 C)   TempSrc: Oral  Oral   Resp: 22  24   Height:    6\' 2"  (1.88 m)  SpO2: 93% 97% 96%     Physical Exam  Constitutional: Appears ill, no acute distress HENT: Normocephalic. External right and left ear normal.  Eyes: Conjunctivae and EOM are normal. PERRLA, no scleral icterus.  Neck: Normal ROM. Neck supple. No JVD.  CVS: RRR, S1/S2 appreciated  Pulmonary: diminished breath sounds, right  side pleural catheter Abdominal: Soft. BS +,  no distension, tenderness across mid abdomen, no rebound or guarding.  Musculoskeletal: Normal range of motion. No edema and no tenderness.  Lymphadenopathy: No lymphadenopathy noted, cervical, inguinal. Neuro: Alert. No focal neurologic deficits Skin: Skin is warm and dry.  Psychiatric: Normal mood and affect. Behavior, judgment, thought content normal.   Labs on Admission:  Basic Metabolic Panel:  Recent Labs Lab 05/07/13 0500 05/08/13 0500 05/11/13 1405  NA  127* 129* 129*  K 3.7 4.1 4.1  CL 90* 90* 90*  CO2 27 26 24   GLUCOSE 133* 134* 116*  BUN 15 13 14   CREATININE 0.55 0.54 0.53  CALCIUM 7.9* 8.3* 8.6   Liver Function Tests:  Recent Labs Lab 05/11/13 1405  AST 29  ALT 21  ALKPHOS 96  BILITOT 0.5  PROT 5.4*  ALBUMIN 1.7*   No results found for this basename: LIPASE, AMYLASE,  in the last 168 hours No results found for this basename: AMMONIA,  in the last 168 hours CBC:  Recent Labs Lab 05/07/13 0500 05/11/13 1405  WBC 0.6* 19.6*  NEUTROABS  --  12.7*  HGB 10.9* 12.4*  HCT 32.6* 35.4*  MCV 84.5 82.9  PLT 115* 302   Cardiac Enzymes:  Recent Labs Lab 05/11/13 1405  TROPONINI <0.30   BNP: No components found with this basename: POCBNP,  CBG:  Recent Labs Lab 05/07/13 0753 05/07/13 1159 05/07/13 1646 05/07/13 2137 05/08/13 0757  GLUCAP 102* 92 109* 121* 123*    If 7PM-7AM, please contact night-coverage [email or web address] Password [facility] 05/11/2013, 6:23 PM

## 2013-05-11 NOTE — ED Notes (Signed)
Nurse called from [facility], pt being sent via private vehicle to ED  Pt has hx of cancer, was discharged from hospital 1/23, came in for follow up appointment. Pt has increased SOB and elevated WBC. Chest xray shows fluid still on lungs.

## 2013-05-11 NOTE — Progress Notes (Signed)
Receiving RN will call back for report.

## 2013-05-11 NOTE — ED Notes (Signed)
Bed: HN88 Expected date:  Expected time:  Means of arrival:  Comments: No monitor in room yet

## 2013-05-11 NOTE — ED Notes (Signed)
Pt to ED with difficulty breathing.  PCP wanted pt to be "checked out because his white count is 25." per wife.  Pt reports chest pain with breathing.  No fever or chills reported.

## 2013-05-11 NOTE — Progress Notes (Signed)
ANTICOAGULATION CONSULT NOTE - Initial Consult  Pharmacy Consult for Lovenox Indication: PE  No Known Allergies  Patient Measurements: Height: 6\' 2"  (188 cm) Weight: 188 lb 0.8 oz (85.3 kg) IBW/kg (Calculated) : 82.2  Vital Signs: Temp: 98.1 F (36.7 C) (01/26 1757) Temp src: Oral (01/26 1757) BP: 106/70 mmHg (01/26 1757) Pulse Rate: 100 (01/26 1624)  Labs:  Recent Labs  05/11/13 1405  HGB 12.4*  HCT 35.4*  PLT 302  CREATININE 0.53  TROPONINI <0.30    Estimated Creatinine Clearance: 119.9 ml/min (by C-G formula based on Cr of 0.53).   Medical History: Past Medical History  Diagnosis Date  . Overweight   . Dyslipidemia   . CAD (coronary artery disease)     [name] (DES) stent 2006; Nuclear stress 05/08, EF 62%, no ischemia  . Ejection fraction     50% catheterization 2006 /  60% nuclear, 2008  . MI (myocardial infarction)   . Kidney stones 1981  . Carotid artery disease     Doppler, May, 2013, 0-39% bilateral  . Ventral hernia     First noted may, 2014  . Pancreatic cancer   . Constipation   . Chemotherapy-induced nausea   . Pleural effusion on right   . Diabetes mellitus type 2 in nonobese   . Cataract    Assessment: 28 yoM with PMHx CAD, HLD, DMT2, ventral hernia, and stage IV metastatic pancreatic cancer receiving chemotherapy through [facility], last on 1/15.  Recent admission for malignant pleural effusions and pneumothorax with pleural catheter placement.  Presents to ED with SOB.  CTA chest shows possible small PE in LLL, large residual hydropneumothroxa and pleural effusion despite previous pleural drainage.  Pharmacy consulted to dose lovenox for PE.  First dose lovenox 90mg  SQ given at 1700.  Renal function ok, plts WNL.  Hgb slightly low @ 12.4.   D-Dimer elevated at 3.56.  ASA 81mg  PTA.     Goal of Therapy:  Anti-Xa level 0.6-1 units/ml 4hrs after LMWH dose given Monitor platelets by anticoagulation protocol: Yes    Plan:  Lovenox 1mg /kg q 12 hours = 85mg  q 12 hrs starting tomorrow at 0500.  F/u CBC, renal function, clinical course.    Ralene Bathe, PharmD, BCPS 05/11/2013, 7:02 PM  Pager: [phone]

## 2013-05-11 NOTE — ED Provider Notes (Signed)
CSN: [id number]     Arrival date & time 05/11/13  1255 History   First MD Initiated Contact with Patient 05/11/13 1304     Chief Complaint  Patient presents with  . Respiratory Distress   (Consider location/radiation/quality/duration/timing/severity/associated sxs/prior Treatment) HPI  This is a 58 yo male with history of coronary artery disease, pancreatic cancer and chronic right pleural effusion status post permanent pleural catheter who presents with shortness of breath. Patient reports progressive shortness of breath. Patient was seen by his primary care physician today and noted to be tachypneic. Evaluation by primary care provider should stable x-ray and white count of 25. He was referred here for further management. Patient reports that he felt at his baseline yesterday. He has had progressive shortness of breath during the day today. He denies any lower extremity swelling. Patient has a chronic right pleural effusion which was drained this morning catheter. He has a recent history of pneumothorax on that same side. Patient reports "pain all over." He denies any recent fevers or cough.  Past Medical History  Diagnosis Date  . Overweight   . Dyslipidemia   . CAD (coronary artery disease)     [name] (DES) stent 2006; Nuclear stress 05/08, EF 62%, no ischemia  . Ejection fraction     50% catheterization 2006 /  60% nuclear, 2008  . MI (myocardial infarction)   . Kidney stones 1981  . Carotid artery disease     Doppler, May, 2013, 0-39% bilateral  . Ventral hernia     First noted may, 2014  . Pancreatic cancer   . Constipation   . Chemotherapy-induced nausea   . Pleural effusion on right   . Diabetes mellitus type 2 in nonobese   . Cataract    Past Surgical History  Procedure Laterality Date  . Cardiac catheterization  2006    EF 50%; 62% nuclear 2008  . Kidney stone surgery    . Ankle surgery  right  . Coronary angioplasty    . Insertion / placement pleural catheter      Family History  Problem Relation Age of Onset  . Liver cancer Sister   . Cancer Paternal Grandmother   . Heart disease Mother   . Heart disease Father   . Stroke Sister   . Stroke Paternal Grandfather    History  Substance Use Topics  . Smoking status: Former Smoker -- 1.00 packs/day for 25 years    Types: Cigarettes  . Smokeless tobacco: Never Used  . Alcohol Use: Yes     Comment: rare    Review of Systems  Constitutional: Negative.  Negative for fever.  Respiratory: Positive for chest tightness and shortness of breath.   Cardiovascular: Negative.  Negative for chest pain.  Gastrointestinal: Positive for abdominal pain. Negative for nausea and vomiting.  Genitourinary: Negative.  Negative for dysuria.  Musculoskeletal: Negative for back pain.  Skin: Negative for rash.  Neurological: Negative for dizziness and headaches.  All other systems reviewed and are negative.    Allergies  Review of patient's allergies indicates no known allergies.  Home Medications   No current outpatient prescriptions on file. BP 106/70  Pulse 100  Temp(Src) 98.1 F (36.7 C) (Oral)  Resp 24  Ht 6\' 2"  (1.88 m)  Wt 188 lb 0.8 oz (85.3 kg)  BMI 24.13 kg/m2  SpO2 96% Physical Exam  Nursing note and vitals reviewed. Constitutional: He is oriented to person, place, and time. No distress.  Chronically ill-appearing, no acute distress  HENT:  Head: Normocephalic and atraumatic.  Eyes: Pupils are equal, round, and reactive to light.  Neck: Neck supple.  Cardiovascular: Regular rhythm and normal heart sounds.   No murmur heard. Tachycardia  Pulmonary/Chest: Effort normal. No respiratory distress. He has no wheezes.  Pleural catheter over the right chest, decreased breath sounds over the right chest  Abdominal: Soft. Bowel sounds are normal. There is no tenderness. There is no rebound.  Musculoskeletal: He exhibits no edema.  Lymphadenopathy:    He has no cervical adenopathy.   Neurological: He is alert and oriented to person, place, and time.  Skin: Skin is warm and dry.  Psychiatric: He has a normal mood and affect.    ED Course  Procedures (including critical care time) Labs Review Labs Reviewed  CBC WITH DIFFERENTIAL - Abnormal; Notable for the following:    WBC 19.6 (*)    Hemoglobin 12.4 (*)    HCT 35.4 (*)    Neutro Abs 12.7 (*)    Lymphs Abs 4.9 (*)    Monocytes Absolute 2.0 (*)    All other components within normal limits  COMPREHENSIVE METABOLIC PANEL - Abnormal; Notable for the following:    Sodium 129 (*)    Chloride 90 (*)    Glucose, Bld 116 (*)    Total Protein 5.4 (*)    Albumin 1.7 (*)    All other components within normal limits  D-DIMER, QUANTITATIVE - Abnormal; Notable for the following:    D-Dimer, Quant 3.56 (*)    All other components within normal limits  TROPONIN I  GLUCOSE, CAPILLARY  MAGNESIUM  PHOSPHORUS  CBC WITH DIFFERENTIAL  APTT  PROTIME-INR  TSH  COMPREHENSIVE METABOLIC PANEL  CBC   Imaging Review Dg Chest 2 View  05/11/2013   CLINICAL DATA:  Cough and respiratory distress  EXAM: CHEST  2 VIEW  COMPARISON:  DG CHEST 2V dated 05/11/2013  FINDINGS: The lungs are reasonably well inflated. There is a stable moderate to large hydropneumothorax on the right. The pleural drainage catheter appears unchanged in position. There is parenchymal density in the right infrahilar region which is stable. There is a small left pleural effusion which appears stable. The cardiac silhouette is normal in size. The Port-A-Cath appliance tip lies in the region of the midportion of the SVC.  IMPRESSION: 1. The right hydropneumothorax on the right is unchanged and likely occupies at least 50-60% of the lung volume. The right-sided small caliber pleural drainage tube is stable in position. 2. A small left pleural effusion is not significantly changed from the previous study.   Electronically Signed   By: David  Martinique   On: 05/11/2013 14:34    Ct Angio Chest Pe W/cm &/or Wo Cm  05/11/2013   CLINICAL DATA:  Worsening shortness of breath and history of pancreatic carcinoma with history of bilateral malignant pleural effusions and lack of expansion of the right lung.  EXAM: CT ANGIOGRAPHY CHEST WITH CONTRAST  TECHNIQUE: Multidetector CT imaging of the chest was performed using the standard protocol during bolus administration of intravenous contrast. Multiplanar CT image reconstructions including MIPs were obtained to evaluate the vascular anatomy.  CONTRAST:  166mL OMNIPAQUE IOHEXOL 350 MG/ML SOLN  COMPARISON:  Multiple recent chest x-ray studies with the latest dated 05/11/2013.  FINDINGS: Pulmonary arterial opacification is adequate. There is suggestion of potentially a small pulmonary embolism to the posterior basilar segmental artery of the left lower lobe. No other emboli are identified.  There remains evidence of trapped  lung on the right with very little aerated lung present and a persistent high did pneumothorax identified. An indwelling tunneled pleural drainage catheter extends into the posterior and inferior pleural space with a small amount of pleural fluid present currently.  There is a moderate left-sided pleural effusion demonstrating partial loculation and extending up into the major fissure. A moderate amount of ascites is also present in the left upper quadrant adjacent to the spleen.  Two separate left lower lobe lung nodules are identified measuring approximately 0.9 cm and 1.1 cm in diameter. No enlarged lymph nodes are seen. A porta cath is present.  Review of the MIP images confirms the above findings.  IMPRESSION: 1. Possible small pulmonary embolism identified in a left lower lobe posterior basilar segmental branch. 2. Persistent "trapped" appearance of the right lung with lack of expansion despite previous pleural drain placement. There is a large residual hydropneumothorax.   Electronically Signed   By: Aletta Edouard M.D.    On: 05/11/2013 15:35    EKG Interpretation    Date/Time:  Monday May 11 2013 13:07:48 EST Ventricular Rate:  113 PR Interval:  133 QRS Duration: 75 QT Interval:  370 QTC Calculation: 507 R Axis:   93 Text Interpretation:  Sinus tachycardia Borderline right axis deviation Borderline T wave abnormalities Prolonged QT interval Now with prolonged QT Confirmed by HORTON  MD, COURTNEY ([id number]) on 05/11/2013 1:55:51 PM            MDM   1. SOB (shortness of breath)   2. Pulmonary embolus   3. Anemia of chronic disease   4. CAD (coronary artery disease)    Patient presents with SOB.  KNown lung trapping on the right.  Tachycardia.  EKG reassuring.  Given Ca hx, CTA of chest obtained to r/o PE.  Small PE noted.  Will be admitted.   Merryl Hacker, MD 05/11/13 [phone]

## 2013-05-12 DIAGNOSIS — I2510 Atherosclerotic heart disease of native coronary artery without angina pectoris: Secondary | ICD-10-CM

## 2013-05-12 DIAGNOSIS — D638 Anemia in other chronic diseases classified elsewhere: Secondary | ICD-10-CM

## 2013-05-12 LAB — COMPREHENSIVE METABOLIC PANEL
ALBUMIN: 1.6 g/dL — AB (ref 3.5–5.2)
ALT: 21 U/L (ref 0–53)
AST: 28 U/L (ref 0–37)
Alkaline Phosphatase: 83 U/L (ref 39–117)
BUN: 13 mg/dL (ref 6–23)
CALCIUM: 8.4 mg/dL (ref 8.4–10.5)
CHLORIDE: 93 meq/L — AB (ref 96–112)
CO2: 21 mEq/L (ref 19–32)
CREATININE: 0.5 mg/dL (ref 0.50–1.35)
GFR calc Af Amer: >90 mL/min (ref >90)
GFR calc non Af Amer: >90 mL/min (ref >90)
Glucose, Bld: 84 mg/dL (ref 70–99)
Potassium: 3.7 mEq/L (ref 3.7–5.3)
Sodium: 133 mEq/L — ABNORMAL LOW (ref 137–147)
Total Bilirubin: 0.4 mg/dL (ref 0.3–1.2)
Total Protein: 5.2 g/dL — ABNORMAL LOW (ref 6.0–8.3)

## 2013-05-12 LAB — CBC
HCT: 33.9 % — ABNORMAL LOW (ref 39.0–52.0)
Hemoglobin: 11.6 g/dL — ABNORMAL LOW (ref 13.0–17.0)
MCH: 28.6 pg (ref 26.0–34.0)
MCHC: 34.2 g/dL (ref 30.0–36.0)
MCV: 83.7 fL (ref 78.0–100.0)
Platelets: 320 10*3/uL (ref 150–400)
RBC: 4.05 MIL/uL — AB (ref 4.22–5.81)
RDW: 15.1 % (ref 11.5–15.5)
WBC: 18.6 10*3/uL — ABNORMAL HIGH (ref 4.0–10.5)

## 2013-05-12 LAB — GLUCOSE, CAPILLARY: Glucose-Capillary: 81 mg/dL (ref 70–99)

## 2013-05-12 LAB — TSH: TSH: 4.619 u[IU]/mL — AB (ref 0.350–4.500)

## 2013-05-12 MED ORDER — RESOURCE INSTANT PROTEIN PO PWD PACKET
1.0000 | Freq: Three times a day (TID) | ORAL | Status: DC
  Administered 2013-05-12 – 2013-05-15 (×9): 6 g via ORAL
  Filled 2013-05-12: qty 227
  Filled 2013-05-12 (×3): qty 6

## 2013-05-12 MED ORDER — INSULIN ASPART 100 UNIT/ML ~~LOC~~ SOLN
0.0000 [IU] | Freq: Three times a day (TID) | SUBCUTANEOUS | Status: DC
  Administered 2013-05-13: 14:00:00 via SUBCUTANEOUS
  Administered 2013-05-14: 1 [IU] via SUBCUTANEOUS

## 2013-05-12 MED ORDER — ONDANSETRON HCL 4 MG PO TABS
4.0000 mg | ORAL_TABLET | Freq: Three times a day (TID) | ORAL | Status: DC
  Administered 2013-05-12 – 2013-05-15 (×9): 4 mg via ORAL
  Filled 2013-05-12 (×13): qty 1

## 2013-05-12 MED ORDER — DRONABINOL 5 MG PO CAPS
5.0000 mg | ORAL_CAPSULE | Freq: Two times a day (BID) | ORAL | Status: DC
  Administered 2013-05-13 – 2013-05-15 (×6): 5 mg via ORAL
  Filled 2013-05-12 (×6): qty 1

## 2013-05-12 NOTE — Progress Notes (Addendum)
Patient ID: Jimmy Christensen, male   DOB: [birth year], 58 y.o.   MRN: [id number]  [facility] PROGRESS NOTE  Pete Schnitzer TFT:[id number] DOB: 08/12/1955 DOA: 05/11/2013 PCP: [name], MD  Brief narrative: 58 year old male with past medical history of pancreatic cancer (undergoing treatment at cancer center in [place] but was seen previously by Dr. [name]) and related malignant pleural effusion with right side pleur-x catheter placement 1 week prior to previous admission, recent admission (on 05/04/2013 for trapped lung due to malignant pleural effusion), hypertension, DM who presented to [facility] ED 05/11/2013 with complaints of worsening shortness of breath for past 24 hours prior to this admission. No associated fever or chills. No significant cough reported. He does experience generalized pain which includes the chest area but this is ongoing. His home medications did not provide significant pain relief.   In ED, BP was 95/50 and has improved to 108/82 with IV fluids. HR was 100 - 110, Tmax 98.1 F and oxygen saturation 93% on 2 L nasal canula oxygen support. Blood work revealed Hgb of 12.4 and sodium of 129. CT angio chest was suspicious for small pulmonary embolism. He was started on Lovenox in ED for anticoagulation.   Assessment and Plan:  Principal Problem:  Pulmonary embolus  - likely secondary to history of malignancy  - started on Lovenox per pharmacy protocol and pt reports feeling better this AM  - oxygen support via nasal canula to keep O2 saturation above 90%  - BD as needed  - pain regimen with dilaudid 2 mg IV every 6 hours PRN  Active Problems:  DYSLIPIDEMIA  - continue statin therapy  CAD (coronary artery disease)  - continue aspirin  Pancreatic cancer with malignant pleural effusions and ascites  - management per oncology  - based on last admission follow up in [place]  Hyponatremia  - likely dehydration versus malignant pleural effusion, Na trending up   - continue to monitor and  repeat BMP in AM Diabetes mellitus type 2  - continue canaglifozin 100 mg PO daily  - continue lantus 10 units at bedtime  - place on SSI while inpatient  Hypertension  - continue metoprolol 25 mg daily  Protein-calorie malnutrition, severe  - secondary to history of pancreatic cancer  - nutrition consulted and recommendations appreciated  - continue ensure supplements  Anemia of chronic disease  - secondary to history of malignancy  - continue to monitor CBC  - hemoglobin on admission 12.4  - no indications for transfusion at this time  Leukocytosis, unspecified  - likely from acute pulmonary embolism  - defer antibiotic treatment for now as no clear infectious etiology identified  Cancer related pain  - continue home long acting meds: MS contin 30 mg PO Q 12 hours  - added dilaudid 2 mg IV every 6 hours PRN  - adjuvant therapy: cymbalta   Radiological Exams on Admission: Dg Chest 2 View  05/11/2013 IMPRESSION: 1. The right hydropneumothorax on the right is unchanged and likely occupies at least 50-60% of the lung volume. The right-sided small caliber pleural drainage tube is stable in position. 2. A small left pleural effusion is not significantly changed from the previous study. Electronically Signed By: David Martinique On: 05/11/2013 14:34  Ct Angio Chest Pe W/cm &/or Wo Cm  05/11/2013 IMPRESSION: 1. Possible small pulmonary embolism identified in a left lower lobe posterior basilar segmental branch. 2. Persistent "trapped" appearance of the right lung with lack of expansion despite previous pleural drain placement. There is  a large residual hydropneumothorax. Electronically Signed By: Aletta Edouard M.D. On: 05/11/2013 15:35  Consultants:  None  Antibiotics:  Cipro 01/23 for SBP, started on last admission 01/23 and recommendation per GI is to complete for total 2 weeks   Code Status: Full Family Communication: Pt and family at bedside Disposition Plan: Home when medically  stable  HPI/Subjective: No events overnight.   Objective: Filed Vitals:   05/11/13 1830 05/11/13 2200 05/12/13 0600 05/12/13 1412  BP:  103/58 111/70 128/56  Pulse:  103 106 109  Temp:  97.1 F (36.2 C) 98.3 F (36.8 C) 98.4 F (36.9 C)  TempSrc:  Axillary Oral Oral  Resp:  22 20 20   Height:      Weight:   85.5 kg (188 lb 7.9 oz)   SpO2: 96% 98% 93% 93%    Intake/Output Summary (Last 24 hours) at 05/12/13 1808 Last data filed at 05/12/13 1400  Gross per 24 hour  Intake 970.44 ml  Output    475 ml  Net 495.44 ml    Exam:   General:  Pt is alert, follows commands appropriately, not in acute distress  Cardiovascular: Regular rhythm, tachycardic, S1/S2, no murmurs, no rubs, no gallops  Respiratory: Clear to auscultation bilaterally, no wheezing, no crackles, no rhonchi  Abdomen: Soft, tender in epigastric area, non distended, bowel sounds present, no guarding  Extremities: No edema, pulses DP and PT palpable bilaterally  Neuro: Grossly nonfocal  Data Reviewed: Basic Metabolic Panel:  Recent Labs Lab 05/07/13 0500 05/08/13 0500 05/11/13 1405 05/11/13 1900 05/12/13 0615  NA 127* 129* 129*  --  133*  K 3.7 4.1 4.1  --  3.7  CL 90* 90* 90*  --  93*  CO2 27 26 24   --  21  GLUCOSE 133* 134* 116*  --  84  BUN 15 13 14   --  13  CREATININE 0.55 0.54 0.53  --  0.50  CALCIUM 7.9* 8.3* 8.6  --  8.4  MG  --   --   --  1.6  --   PHOS  --   --   --  2.7  --    Liver Function Tests:  Recent Labs Lab 05/11/13 1405 05/12/13 0615  AST 29 28  ALT 21 21  ALKPHOS 96 83  BILITOT 0.5 0.4  PROT 5.4* 5.2*  ALBUMIN 1.7* 1.6*   CBC:  Recent Labs Lab 05/07/13 0500 05/11/13 1405 05/12/13 0615  WBC 0.6* 19.6* 18.6*  NEUTROABS  --  12.7*  --   HGB 10.9* 12.4* 11.6*  HCT 32.6* 35.4* 33.9*  MCV 84.5 82.9 83.7  PLT 115* 302 320   Cardiac Enzymes:  Recent Labs Lab 05/11/13 1405  TROPONINI <0.30   CBG:  Recent Labs Lab 05/07/13 1646 05/07/13 2137  05/08/13 0757 05/11/13 1821 05/12/13 0739  GLUCAP 109* 121* 123* 91 81    Recent Results (from the past 240 hour(s))  CULTURE, BLOOD (ROUTINE X 2)     Status: None   Collection Time    05/04/13 11:35 AM      Result Value Range Status   Specimen Description BLOOD LEFT HAND   Final   Special Requests BOTTLES DRAWN AEROBIC AND ANAEROBIC 3ML   Final   Culture  Setup Time     Final   Value: 05/04/2013 14:07     Performed at [facility]   Culture     Final   Value: NO GROWTH 5 DAYS  Performed at [facility]   Report Status 05/10/2013 FINAL   Final  CULTURE, BLOOD (ROUTINE X 2)     Status: None   Collection Time    05/04/13 11:40 AM      Result Value Range Status   Specimen Description BLOOD RIGHT HAND   Final   Special Requests BOTTLES DRAWN AEROBIC ONLY 3ML   Final   Culture  Setup Time     Final   Value: 05/04/2013 14:07     Performed at [facility]   Culture     Final   Value: NO GROWTH 5 DAYS     Performed at [facility]   Report Status 05/10/2013 FINAL   Final  BODY FLUID CULTURE     Status: None   Collection Time    05/04/13  2:08 PM      Result Value Range Status   Specimen Description PLEURAL   Final   Special Requests Immunocompromised   Final   Gram Stain     Final   Value: CYTOSPIN FEW WBC PRESENT,BOTH PMN AND MONONUCLEAR     NO ORGANISMS SEEN     Performed by [facility] Gram Stain Report Called to,Read Back By and Verified With: Gram Stain Report Called to,Read Back By and Verified With:  Burman Nieves RN AT [id number] ON01/19/15 BY A NAVARRO     Performed at [facility]   Culture     Final   Value: NO GROWTH 3 DAYS     Performed at [facility]   Report Status 05/08/2013 FINAL   Final  GRAM STAIN     Status: None   Collection Time    05/04/13  2:08 PM      Result Value Range Status   Specimen Description PLEURAL   Final   Special Requests Immunocompromised   Final   Gram Stain     Final   Value:  CYTOSPIN     FEW WBC PRESENT,BOTH PMN AND MONONUCLEAR     NO ORGANISMS SEEN     Gram Stain Report Called to,Read Back By and Verified With: J Maxie Better RN [phone] 05/04/13 A NAVARRO   Report Status 05/04/2013 FINAL   Final  MRSA PCR SCREENING     Status: None   Collection Time    05/04/13  5:23 PM      Result Value Range Status   MRSA by PCR NEGATIVE  NEGATIVE Final   Comment:            The GeneXpert MRSA Assay (FDA     approved for NASAL specimens     only), is one component of a     comprehensive MRSA colonization     surveillance program. It is not     intended to diagnose MRSA     infection nor to guide or     monitor treatment for     MRSA infections.  CLOSTRIDIUM DIFFICILE BY PCR     Status: None   Collection Time    05/06/13 12:36 AM      Result Value Range Status   C difficile by pcr NEGATIVE  NEGATIVE Final   Comment: Performed at [facility]     Status: None   Collection Time    05/06/13 12:36 AM      Result Value Range Status   Specimen Description STOOL   Final   Special Requests Normal   Final  Culture     Final   Value: NO SALMONELLA, SHIGELLA, CAMPYLOBACTER, YERSINIA, OR E.COLI 0157:H7 ISOLATED     Performed at [facility]   Report Status 05/09/2013 FINAL   Final  URINE CULTURE     Status: None   Collection Time    05/06/13 11:16 AM      Result Value Range Status   Specimen Description URINE, CLEAN CATCH   Final   Special Requests NONE   Final   Culture  Setup Time     Final   Value: 05/06/2013 13:47     Performed at [facility]     Final   Value: NO GROWTH     Performed at [facility]   Culture     Final   Value: NO GROWTH     Performed at [facility]   Report Status 05/07/2013 FINAL   Final     Scheduled Meds: . aspirin EC  81 mg Oral Daily  . atorvastatin  40 mg Oral Daily  . Canagliflozin  1 tablet Oral Daily  . cholecalciferol  5,000 Units Oral Daily  . ciprofloxacin  500  mg Oral BID  . diclofenac  1 patch Transdermal BID  . DULoxetine  60 mg Oral Daily  . enoxaparin (LOVENOX) injection  1 mg/kg Subcutaneous Q12H  . feeding supplement (ENSURE COMPLETE)  237 mL Oral TID BM  . insulin glargine  10 Units Subcutaneous QHS  . lidocaine-prilocaine  1 application Topical Once  . metFORMIN  500 mg Oral Q breakfast  . metoprolol succinate  25 mg Oral Daily  . morphine  30 mg Oral Q12H  . niacin  1,000 mg Oral QHS  . ondansetron  4 mg Oral Q8H  . pantoprazole  40 mg Oral Daily  . protein supplement  1 scoop Oral TID WC  . sodium chloride  10-40 mL Intracatheter Q12H  . sodium chloride  3 mL Intravenous Q12H  . Zinc  10 mg Mouth/Throat Daily   Continuous Infusions: . sodium chloride 75 mL/hr at 05/12/13 0526    Faye Ramsay, MD  [facility] Pager [phone]  If 7PM-7AM, please contact night-coverage [email or web address] Password [email or web address] 05/12/2013, 6:08 PM   LOS: 1 day

## 2013-05-12 NOTE — Progress Notes (Signed)
INITIAL NUTRITION ASSESSMENT  Pt meets criteria for severe MALNUTRITION in the context of chronic illness as evidenced by 9% weight loss in the past month in addition to pt with severe muscle wasting and subcutaneous fat loss in clavicles.  DOCUMENTATION CODES Per approved criteria  -Severe malnutrition in the context of chronic illness   INTERVENTION: - Had long discussion with pt and wife about strategies to help with pt's reflux, changes in taste, and poor appetite - Carnation Instant Breakfast BID and Magic Cup BID - Continue Ensure Complete TID - Beneprotein powder TID - Recommend MD change antiemetics from PRN to scheduled and order stronger medication for reflux - Will continue to monitor   NUTRITION DIAGNOSIS: Inadequate oral intake related to poor appetite as evidenced by <25% meal intake.   Goal: 1. Resolution of nausea and reflux 2. Pt to consume >90% of meals/supplements  Monitor:  Weights, labs, intake, nausea, reflux  Reason for Assessment: Malnutrition screening tool   58 y.o. male  Admitting Dx: Pulmonary embolus  ASSESSMENT:  Pt with recently diagnosed w/ stage IV pancreatic cancer in December. He is receiving his chemotherapy at [facility] in [place] (last chemo 04/30/13, and recently had Pleurx tube placed on 04/29/13, drained last 05/03/13) . Presents to ER at [facility] on 05/04/13 with 1d h/o progressive abd pain, fever and associated dyspnea. On eval pt found to have large right pneumothorax. Per palliative care notes, pt with likely less than 6 months prognosis. Was d/c 05/08/13. Admitted to hospital with shortness of breath and was found to have pulmonary embolus likely secondary to history of malignancy.   Pt known to RD from admission earlier this month. Pt had poor appetite then and wife reports pt's appetite even less than that now. Before previous admission pt was eating 3 meals/day at home and drinking 3 smoothies/day mixed with protein powder. Had  struggled with nausea. Wife reports since d/c, pt has bee only able to eat liquids as when pt eats solid food, he feels like he's going to vomit. Was trying zinc at home to help with pt's changes in taste, recently c/o things tasting too sweet. Tried some orange juice and coffee this morning but states both items burned his throat. Also has hx of reflux. Denies having any mouth sores. States last BM was day before yesterday. Getting lactulose. Wife reports she bought some Benecalorie (330 calories, 7g protein) and has been adding that to his food/drinks.   Pt getting zinc lozenge 10mg  daily, Compazine PRN, Protonix, and scheduled and PRN Zofran.  Pt and wife report at least 30 pound unintended weight loss since last month. Noted pt with severe muscle wasting and subcutaneous fat loss in clavicle region and mild/moderate muscle wasting and subcutaneous fat loss throughout body.    Height: Ht Readings from Last 1 Encounters:  05/11/13 6\' 2"  (1.88 m)    Weight: Wt Readings from Last 1 Encounters:  05/12/13 188 lb 7.9 oz (85.5 kg)    Ideal Body Weight: 190 lb   % Ideal Body Weight: 99%   Wt Readings from Last 10 Encounters:  05/12/13 188 lb 7.9 oz (85.5 kg)  05/07/13 198 lb 10.2 oz (90.1 kg)  05/02/13 188 lb (85.276 kg)  04/02/13 207 lb 8 oz (94.121 kg)  09/12/12 256 lb (116.121 kg)  08/08/11 266 lb (120.657 kg)  05/06/11 259 lb 14.8 oz (117.9 kg)  10/13/10 261 lb 12.8 oz (118.752 kg)  09/22/09 257 lb (116.574 kg)  09/22/08 241 lb (  109.317 kg)    Usual Body Weight: 218 lb at least per wife   % Usual Body Weight: 86%   BMI:  Body mass index is 24.19 kg/(m^2).  Estimated Nutritional Needs: Kcal: 2200-2550  Protein: 105-130g  Fluid: 2.2-2.5L/day   Skin: Intact    Diet Order: Full Liquid  EDUCATION NEEDS: -Education needs addressed - educated pt and wife on ways to improve appetite and help with changes in taste. Handouts provided.    Intake/Output Summary (Last 24  hours) at 05/12/13 1413 Last data filed at 05/12/13 0523  Gross per 24 hour  Intake 1210.44 ml  Output      0 ml  Net 1210.44 ml    Last BM: 1/25  Labs:   Recent Labs Lab 05/08/13 0500 05/11/13 1405 05/11/13 1900 05/12/13 0615  NA 129* 129*  --  133*  K 4.1 4.1  --  3.7  CL 90* 90*  --  93*  CO2 26 24  --  21  BUN 13 14  --  13  CREATININE 0.54 0.53  --  0.50  CALCIUM 8.3* 8.6  --  8.4  MG  --   --  1.6  --   PHOS  --   --  2.7  --   GLUCOSE 134* 116*  --  84    CBG (last 3)   Recent Labs  05/11/13 1821 05/12/13 0739  GLUCAP 91 81    Scheduled Meds: . aspirin EC  81 mg Oral Daily  . atorvastatin  40 mg Oral Daily  . Canagliflozin  1 tablet Oral Daily  . cholecalciferol  5,000 Units Oral Daily  . ciprofloxacin  500 mg Oral BID  . diclofenac  1 patch Transdermal BID  . DULoxetine  60 mg Oral Daily  . enoxaparin (LOVENOX) injection  1 mg/kg Subcutaneous Q12H  . feeding supplement (ENSURE COMPLETE)  237 mL Oral TID BM  . insulin glargine  10 Units Subcutaneous QHS  . lidocaine-prilocaine  1 application Topical Once  . metFORMIN  500 mg Oral Q breakfast  . metoprolol succinate  25 mg Oral Daily  . morphine  30 mg Oral Q12H  . niacin  1,000 mg Oral QHS  . ondansetron  4 mg Oral Q8H  . pantoprazole  40 mg Oral Daily  . sodium chloride  10-40 mL Intracatheter Q12H  . sodium chloride  3 mL Intravenous Q12H  . Zinc  10 mg Mouth/Throat Daily    Continuous Infusions: . sodium chloride 75 mL/hr at 05/12/13 [id number]    Past Medical History  Diagnosis Date  . Overweight   . Dyslipidemia   . CAD (coronary artery disease)     [name] (DES) stent 2006; Nuclear stress 05/08, EF 62%, no ischemia  . Ejection fraction     50% catheterization 2006 /  60% nuclear, 2008  . MI (myocardial infarction)   . Kidney stones 1981  . Carotid artery disease     Doppler, May, 2013, 0-39% bilateral  . Ventral hernia     First noted may, 2014  . Pancreatic cancer   .  Constipation   . Chemotherapy-induced nausea   . Pleural effusion on right   . Diabetes mellitus type 2 in nonobese   . Cataract     Past Surgical History  Procedure Laterality Date  . Cardiac catheterization  2006    EF 50%; 62% nuclear 2008  . Kidney stone surgery    . Ankle surgery  right  .  Coronary angioplasty    . Insertion / placement pleural catheter      Mikey College MS, RD, LDN [phone] Pager [phone] After Hours Pager

## 2013-05-13 ENCOUNTER — Inpatient Hospital Stay (HOSPITAL_COMMUNITY): Payer: BC Managed Care – PPO

## 2013-05-13 DIAGNOSIS — J90 Pleural effusion, not elsewhere classified: Secondary | ICD-10-CM

## 2013-05-13 DIAGNOSIS — R0602 Shortness of breath: Secondary | ICD-10-CM

## 2013-05-13 DIAGNOSIS — E119 Type 2 diabetes mellitus without complications: Secondary | ICD-10-CM

## 2013-05-13 DIAGNOSIS — J9383 Other pneumothorax: Secondary | ICD-10-CM

## 2013-05-13 LAB — GLUCOSE, CAPILLARY
GLUCOSE-CAPILLARY: 106 mg/dL — AB (ref 70–99)
Glucose-Capillary: 126 mg/dL — ABNORMAL HIGH (ref 70–99)
Glucose-Capillary: 111 mg/dL — ABNORMAL HIGH (ref 70–99)
Glucose-Capillary: 128 mg/dL — ABNORMAL HIGH (ref 70–99)
Glucose-Capillary: 80 mg/dL (ref 70–99)

## 2013-05-13 LAB — CBC
HEMATOCRIT: 34.2 % — AB (ref 39.0–52.0)
HEMOGLOBIN: 11.5 g/dL — AB (ref 13.0–17.0)
MCH: 28.5 pg (ref 26.0–34.0)
MCHC: 33.6 g/dL (ref 30.0–36.0)
MCV: 84.9 fL (ref 78.0–100.0)
Platelets: 364 10*3/uL (ref 150–400)
RBC: 4.03 MIL/uL — ABNORMAL LOW (ref 4.22–5.81)
RDW: 15.2 % (ref 11.5–15.5)
WBC: 17.5 10*3/uL — ABNORMAL HIGH (ref 4.0–10.5)

## 2013-05-13 LAB — BASIC METABOLIC PANEL
BUN: 10 mg/dL (ref 6–23)
CALCIUM: 7.8 mg/dL — AB (ref 8.4–10.5)
CO2: 24 mEq/L (ref 19–32)
CREATININE: 0.47 mg/dL — AB (ref 0.50–1.35)
Chloride: 95 mEq/L — ABNORMAL LOW (ref 96–112)
GFR calc Af Amer: >90 mL/min (ref >90)
Glucose, Bld: 100 mg/dL — ABNORMAL HIGH (ref 70–99)
Potassium: 3.7 mEq/L (ref 3.7–5.3)
SODIUM: 131 meq/L — AB (ref 137–147)

## 2013-05-13 MED ORDER — ONDANSETRON HCL 4 MG/2ML IJ SOLN
4.0000 mg | Freq: Four times a day (QID) | INTRAMUSCULAR | Status: DC | PRN
  Administered 2013-05-13: 4 mg via INTRAVENOUS
  Filled 2013-05-13: qty 2

## 2013-05-13 MED ORDER — ENOXAPARIN (LOVENOX) PATIENT EDUCATION KIT
PACK | Freq: Once | Status: AC
  Administered 2013-05-13: 10:00:00
  Filled 2013-05-13: qty 1

## 2013-05-13 NOTE — Consult Note (Signed)
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 He has been receiving chemotherapy in [place]. And had rt chest tube placed 2 weeks ago with 6000cc drained(per pt) and 400-600 cc qod drained.  He has had 2 rounds of chemotherapy and PCCM asked to see him for dyspnea. CT reveals small LLL pe along with extensive trapped rt lung and pleural effusions. PCCm asked to evaluate.   PAST MEDICAL HISTORY :  Past Medical History  Diagnosis Date  . Overweight   . Dyslipidemia   . CAD (coronary artery disease)     [name] (DES) stent 2006; Nuclear stress 05/08, EF 62%, no ischemia  . Ejection fraction     50% catheterization 2006 /  60% nuclear, 2008  . MI (myocardial infarction)   . Kidney stones 1981  . Carotid artery disease     Doppler, May, 2013, 0-39% bilateral  . Ventral hernia     First noted may, 2014  . Pancreatic cancer   . Constipation   . Chemotherapy-induced nausea   . Pleural effusion on right   . Diabetes mellitus type 2 in nonobese   . Cataract    Past Surgical History  Procedure Laterality Date  . Cardiac catheterization  2006    EF 50%; 62% nuclear 2008  . Kidney stone surgery    . Ankle surgery  right  . Coronary angioplasty    . Insertion / placement pleural catheter     Prior to Admission medications   Medication Sig Start Date End Date Taking? Authorizing Provider  aspirin 81 MG EC tablet Take 81  mg by mouth daily.     Yes Historical Provider, MD  atorvastatin (LIPITOR) 40 MG tablet Take 1 tablet (40 mg total) by mouth daily. 09/25/12 10/30/13 Yes Carlena Bjornstad, MD  Canagliflozin [facility]) 100 MG TABS Take 1 tablet by mouth daily.   Yes Historical Provider, MD  cholecalciferol (VITAMIN D) 1000 UNITS tablet Take 5,000 Units by mouth daily.   Yes Historical Provider, MD  ciprofloxacin (CIPRO) 500 MG tablet Take 1 tablet (500 mg total) by mouth 2 (two) times daily. 05/08/13  Yes Adeline Saralyn Pilar, MD  DULoxetine (CYMBALTA) 60 MG capsule Take 60 mg by mouth daily.   Yes Historical Provider, MD  feeding supplement, ENSURE COMPLETE, (ENSURE COMPLETE) LIQD Take 237 mLs by mouth 3 (three) times daily between meals. 05/08/13  Yes Adeline C Viyuoh, MD  fluticasone (FLONASE) 50 MCG/ACT nasal spray Place 1 spray into both nostrils daily as needed.  03/14/13  Yes Historical Provider, MD  hydrocortisone (ANUSOL-HC) 2.5 % rectal cream Place rectally 2 (two) times daily as needed for hemorrhoids or itching. 05/08/13  Yes Adeline C Viyuoh, MD  HYDROmorphone (DILAUDID) 2 MG tablet Take by mouth every 6 (six) hours as needed for severe pain.   Yes Historical Provider, MD  insulin glargine (LANTUS) 100 UNIT/ML injection Inject 10 Units into the skin at  bedtime.    Yes Historical Provider, MD  lactulose (CHRONULAC) 10 GM/15ML solution Take 15 mLs (10 g total) by mouth 2 (two) times daily as needed for mild constipation or moderate constipation. 05/08/13  Yes Adeline C Viyuoh, MD  lidocaine-prilocaine (EMLA) cream Apply 1 application topically once.   Yes Historical Provider, MD  metFORMIN (GLUCOPHAGE) 500 MG tablet Take 1 tablet by mouth daily. 02/18/13  Yes Historical Provider, MD  metoprolol succinate (TOPROL-XL) 25 MG 24 hr tablet Take 1 tablet (25 mg total) by mouth daily. 09/25/12 10/30/13 Yes Carlena Bjornstad, MD  morphine (MS CONTIN) 30 MG 12 hr tablet Take 30 mg by mouth every 12 (twelve) hours.   Yes Historical  Provider, MD  niacin (NIASPAN) 1000 MG CR tablet Take 1 tablet (1,000 mg total) by mouth at bedtime. 09/24/12 10/29/13 Yes Carlena Bjornstad, MD  ondansetron (ZOFRAN) 4 MG tablet Take 4 mg by mouth every 8 (eight) hours as needed for nausea or vomiting.   Yes Historical Provider, MD  pantoprazole (PROTONIX) 40 MG tablet Take 1 tablet (40 mg total) by mouth daily. 06/13/12 06/13/13 Yes Carlena Bjornstad, MD  prochlorperazine (COMPAZINE) 5 MG tablet Take 5 mg by mouth every 6 (six) hours as needed for nausea or vomiting.   Yes Historical Provider, MD  Zinc 10 MG LOZG Use as directed 10 mg in the mouth or throat daily.   Yes Historical Provider, MD  diclofenac (FLECTOR) 1.3 % PTCH Place 1 patch onto the skin 2 (two) times daily. 05/08/13   Sheila Oats, MD  nitroGLYCERIN (NITROSTAT) 0.4 MG SL tablet Place 1 tablet (0.4 mg total) under the tongue every 5 (five) minutes as needed. May repeat up to 3 doses. 08/08/11   Carlena Bjornstad, MD   No Known Allergies  FAMILY HISTORY:  Family History  Problem Relation Age of Onset  . Liver cancer Sister   . Cancer Paternal Grandmother   . Heart disease Mother   . Heart disease Father   . Stroke Sister   . Stroke Paternal Grandfather    SOCIAL HISTORY:  reports that he has quit smoking. His smoking use included Cigarettes. He has a 25 pack-year smoking history. He has never used smokeless tobacco. He reports that he drinks alcohol. He reports that he does not use illicit drugs.  REVIEW OF SYSTEMS:   10 point review of system taken, please see HPI for positives and negatives.  SUBJECTIVE:   VITAL SIGNS: Temp:  [98.1 F (36.7 C)-98.6 F (37 C)] 98.1 F (36.7 C) (01/28 0628) Pulse Rate:  [100-109] 100 (01/28 0628) Resp:  [20] 20 (01/28 0628) BP: (100-128)/(56-64) 100/64 mmHg (01/28 0628) SpO2:  [92 %-93 %] 92 % (01/28 0628) Weight:  [188 lb 7.9 oz (85.5 kg)] 188 lb 7.9 oz (85.5 kg) (01/28 0628) HEMODYNAMICS:   VENTILATOR SETTINGS:   INTAKE /  OUTPUT: Intake/Output     01/27 0701 - 01/28 0700 01/28 0701 - 01/29 0700   P.O. 680 240   I.V. (mL/kg) 1063.8 (12.4) 20 (0.2)   Total Intake(mL/kg) 1743.8 (20.4) 260 (3)   Urine (mL/kg/hr) 975 (0.5)    Total Output 975     Net +768.8 +260        Stool Occurrence 1 x      PHYSICAL EXAMINATION: General:  Wasted WM with increased wob Neuro:  Intact HEENT:  No LAN/JVD Cardiovascular:  HSR RRR Lungs:  Decreased air movement , dull in bases. Rt chest tube noted Abdomen:  +  ascites Musculoskeletal: wasted muculature Skin:  warm  LABS:  CBC  Recent Labs Lab 05/11/13 1405 05/12/13 0615 05/13/13 0425  WBC 19.6* 18.6* 17.5*  HGB 12.4* 11.6* 11.5*  HCT 35.4* 33.9* 34.2*  PLT 302 320 364   Coag's  Recent Labs Lab 05/11/13 1900  APTT 37  INR 1.16   BMET  Recent Labs Lab 05/11/13 1405 05/12/13 0615 05/13/13 0425  NA 129* 133* 131*  K 4.1 3.7 3.7  CL 90* 93* 95*  CO2 24 21 24   BUN 14 13 10   CREATININE 0.53 0.50 0.47*  GLUCOSE 116* 84 100*   Electrolytes  Recent Labs Lab 05/11/13 1405 05/11/13 1900 05/12/13 0615 05/13/13 0425  CALCIUM 8.6  --  8.4 7.8*  MG  --  1.6  --   --   PHOS  --  2.7  --   --    Sepsis Markers No results found for this basename: LATICACIDVEN, PROCALCITON, O2SATVEN,  in the last 168 hours ABG No results found for this basename: PHART, PCO2ART, PO2ART,  in the last 168 hours Liver Enzymes  Recent Labs Lab 05/11/13 1405 05/12/13 0615  AST 29 28  ALT 21 21  ALKPHOS 96 83  BILITOT 0.5 0.4  ALBUMIN 1.7* 1.6*   Cardiac Enzymes  Recent Labs Lab 05/11/13 1405  TROPONINI <0.30   Glucose  Recent Labs Lab 05/08/13 0757 05/11/13 1821 05/12/13 0739 05/12/13 2236 05/13/13 0752 05/13/13 1118  GLUCAP 123* 91 81 126* 111* 128*    Imaging Dg Chest 2 View  05/11/2013   CLINICAL DATA:  Cough and respiratory distress  EXAM: CHEST  2 VIEW  COMPARISON:  DG CHEST 2V dated 05/11/2013  FINDINGS: The lungs are reasonably well  inflated. There is a stable moderate to large hydropneumothorax on the right. The pleural drainage catheter appears unchanged in position. There is parenchymal density in the right infrahilar region which is stable. There is a small left pleural effusion which appears stable. The cardiac silhouette is normal in size. The Port-A-Cath appliance tip lies in the region of the midportion of the SVC.  IMPRESSION: 1. The right hydropneumothorax on the right is unchanged and likely occupies at least 50-60% of the lung volume. The right-sided small caliber pleural drainage tube is stable in position. 2. A small left pleural effusion is not significantly changed from the previous study.   Electronically Signed   By: David  Martinique   On: 05/11/2013 14:34   Ct Angio Chest Pe W/cm &/or Wo Cm  05/11/2013   CLINICAL DATA:  Worsening shortness of breath and history of pancreatic carcinoma with history of bilateral malignant pleural effusions and lack of expansion of the right lung.  EXAM: CT ANGIOGRAPHY CHEST WITH CONTRAST  TECHNIQUE: Multidetector CT imaging of the chest was performed using the standard protocol during bolus administration of intravenous contrast. Multiplanar CT image reconstructions including MIPs were obtained to evaluate the vascular anatomy.  CONTRAST:  125mL OMNIPAQUE IOHEXOL 350 MG/ML SOLN  COMPARISON:  Multiple recent chest x-ray studies with the latest dated 05/11/2013.  FINDINGS: Pulmonary arterial opacification is adequate. There is suggestion of potentially a small pulmonary embolism to the posterior basilar segmental artery of the left lower lobe. No other emboli are identified.  There remains evidence of trapped lung on the right with very little aerated lung present and a persistent high did pneumothorax identified. An indwelling tunneled pleural drainage catheter extends into the posterior and inferior pleural space with a small amount of pleural fluid  present currently.  There is a moderate  left-sided pleural effusion demonstrating partial loculation and extending up into the major fissure. A moderate amount of ascites is also present in the left upper quadrant adjacent to the spleen.  Two separate left lower lobe lung nodules are identified measuring approximately 0.9 cm and 1.1 cm in diameter. No enlarged lymph nodes are seen. A porta cath is present.  Review of the MIP images confirms the above findings.  IMPRESSION: 1. Possible small pulmonary embolism identified in a left lower lobe posterior basilar segmental branch. 2. Persistent "trapped" appearance of the right lung with lack of expansion despite previous pleural drain placement. There is a large residual hydropneumothorax.   Electronically Signed   By: Aletta Edouard M.D.   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  Therefore, first step will be to hook up pleurex to suction via a waterseal and repeat CXR.  If the lung remains deflated then the patient has a trapped lung there is very little to be done other than accept that is it is there.  If the lung does expand then we will need to test the catheter for damage or observe the patient's drainage process to see if he is allowing air into his chest cavity while draining.  Richardson Landry Minor ACNP Maryanna Shape PCCM Pager [phone] till 3 pm If no answer page [phone] 05/13/2013, 1:49 PM  See plan above.  Patient seen and examined, agree with above note.  I dictated  the care and orders written for this patient under my direction.  Rush Farmer, MD [phone]

## 2013-05-13 NOTE — Progress Notes (Signed)
Patient ID: Jimmy Christensen, male   DOB: 1955-04-19, 58 y.o.   MRN: [id number]  [facility] PROGRESS NOTE  Jimmy Christensen ONG:[id number] DOB: 1956-03-05 DOA: 05/11/2013 PCP: [name], MD  Brief narrative: 58 year old male with past medical history of pancreatic cancer (undergoing treatment at cancer center in [place] but was seen previously by Dr. [name]) and related malignant pleural effusion with right side pleur-x catheter placement 1 week prior to previous admission, recent admission (on 05/04/2013 for trapped lung due to malignant pleural effusion), hypertension, DM, pneumothorax presented to [facility] ED 05/11/2013 with complaints of worsening shortness of breath for past 24 hours prior to this admission.  Assessment and Plan:   Small Pulmonary embolus  - continue Lovenox per pharmacy protocol  -lovenox teaching  Moderate to large R pneumothorax -suspect his symptoms are likely related to this -unfortunately underlying trapped lung -pleurx being drained every other day -will request Pulm eval for options  CAD (coronary artery disease)  - continue aspirin, statin  Pancreatic cancer with malignant pleural effusions and ascites  - wants to continue Chemo at Cancer treatments centers in [place] -continue MS Contin, cymbalta and dilaudid PRN -had palliative meeting last admission, plan for aggressive scope of treatment  Diabetes mellitus type 2  - continue canaglifozin 100 mg PO daily  - continue lantus 10 units at bedtime, SSI - stable   Protein-calorie malnutrition, severe  - secondary to history of pancreatic cancer  - nutrition consulted and recommendations appreciated  - continue ensure supplements   Anemia of chronic disease  - secondary to history of malignancy   Leukocytosis, unspecified  - likely from acute pulmonary embolism, afebrile  - defer antibiotic treatment for now as no clear infectious etiology identified   Code Status: Full Family Communication: Pt and family at  bedside Disposition Plan: Home when medically stable  Radiological Exams on Admission: Dg Chest 2 View  05/11/2013 IMPRESSION: 1. The right hydropneumothorax on the right is unchanged and likely occupies at least 50-60% of the lung volume. The right-sided small caliber pleural drainage tube is stable in position. 2. A small left pleural effusion is not significantly changed from the previous study. Electronically Signed By: David Martinique On: 05/11/2013 14:34  Ct Angio Chest Pe W/cm &/or Wo Cm  05/11/2013 IMPRESSION: 1. Possible small pulmonary embolism identified in a left lower lobe posterior basilar segmental branch. 2. Persistent "trapped" appearance of the right lung with lack of expansion despite previous pleural drain placement. There is a large residual hydropneumothorax. Electronically Signed By: Aletta Edouard M.D. On: 05/11/2013 15:35  Consultants:  None  Antibiotics:  Cipro 01/23 for SBP, started on last admission 01/23 and recommendation per GI is to complete for total 2 weeks    HPI/Subjective: Still with dyspnea   Objective: Filed Vitals:   05/12/13 0600 05/12/13 1412 05/12/13 2200 05/13/13 0628  BP: 111/70 128/56 100/60 100/64  Pulse: 106 109 100 100  Temp: 98.3 F (36.8 C) 98.4 F (36.9 C) 98.6 F (37 C) 98.1 F (36.7 C)  TempSrc: Oral Oral Oral Oral  Resp: 20 20 20 20   Height:      Weight: 85.5 kg (188 lb 7.9 oz)   85.5 kg (188 lb 7.9 oz)  SpO2: 93% 93% 92% 92%    Intake/Output Summary (Last 24 hours) at 05/13/13 1215 Last data filed at 05/13/13 0916  Gross per 24 hour  Intake 1643.75 ml  Output    775 ml  Net 868.75 ml    Exam:  General:  Pt is alert, follows commands appropriately, not in acute distress  Cardiovascular: Regular rhythm, tachycardic, S1/S2, no murmurs, no rubs, no gallops  Respiratory: diminished on R  Abdomen: Soft, tender in epigastric area, non distended, bowel sounds present, no guarding  Extremities: No edema, pulses DP and  PT palpable bilaterally  Neuro: Grossly nonfocal  Data Reviewed: Basic Metabolic Panel:  Recent Labs Lab 05/07/13 0500 05/08/13 0500 05/11/13 1405 05/11/13 1900 05/12/13 0615 05/13/13 0425  NA 127* 129* 129*  --  133* 131*  K 3.7 4.1 4.1  --  3.7 3.7  CL 90* 90* 90*  --  93* 95*  CO2 27 26 24   --  21 24  GLUCOSE 133* 134* 116*  --  84 100*  BUN 15 13 14   --  13 10  CREATININE 0.55 0.54 0.53  --  0.50 0.47*  CALCIUM 7.9* 8.3* 8.6  --  8.4 7.8*  MG  --   --   --  1.6  --   --   PHOS  --   --   --  2.7  --   --    Liver Function Tests:  Recent Labs Lab 05/11/13 1405 05/12/13 0615  AST 29 28  ALT 21 21  ALKPHOS 96 83  BILITOT 0.5 0.4  PROT 5.4* 5.2*  ALBUMIN 1.7* 1.6*   CBC:  Recent Labs Lab 05/07/13 0500 05/11/13 1405 05/12/13 0615 05/13/13 0425  WBC 0.6* 19.6* 18.6* 17.5*  NEUTROABS  --  12.7*  --   --   HGB 10.9* 12.4* 11.6* 11.5*  HCT 32.6* 35.4* 33.9* 34.2*  MCV 84.5 82.9 83.7 84.9  PLT 115* 302 320 364   Cardiac Enzymes:  Recent Labs Lab 05/11/13 1405  TROPONINI <0.30   CBG:  Recent Labs Lab 05/11/13 1821 05/12/13 0739 05/12/13 2236 05/13/13 0752 05/13/13 1118  GLUCAP 91 81 126* 111* 128*    Recent Results (from the past 240 hour(s))  CULTURE, BLOOD (ROUTINE X 2)     Status: None   Collection Time    05/04/13 11:35 AM      Result Value Range Status   Specimen Description BLOOD LEFT HAND   Final   Special Requests BOTTLES DRAWN AEROBIC AND ANAEROBIC 3ML   Final   Culture  Setup Time     Final   Value: 05/04/2013 14:07     Performed at [facility]   Culture     Final   Value: NO GROWTH 5 DAYS     Performed at [facility]   Report Status 05/10/2013 FINAL   Final  CULTURE, BLOOD (ROUTINE X 2)     Status: None   Collection Time    05/04/13 11:40 AM      Result Value Range Status   Specimen Description BLOOD RIGHT HAND   Final   Special Requests BOTTLES DRAWN AEROBIC ONLY 3ML   Final   Culture  Setup Time      Final   Value: 05/04/2013 14:07     Performed at [facility]   Culture     Final   Value: NO GROWTH 5 DAYS     Performed at [facility]   Report Status 05/10/2013 FINAL   Final  BODY FLUID CULTURE     Status: None   Collection Time    05/04/13  2:08 PM      Result Value Range Status   Specimen Description PLEURAL   Final  Special Requests Immunocompromised   Final   Gram Stain     Final   Value: CYTOSPIN FEW WBC PRESENT,BOTH PMN AND MONONUCLEAR     NO ORGANISMS SEEN     Performed by [facility] Gram Stain Report Called to,Read Back By and Verified With: Gram Stain Report Called to,Read Back By and Verified With:  Burman Nieves RN AT [id number] ON01/19/15 BY A NAVARRO     Performed at [facility]   Culture     Final   Value: NO GROWTH 3 DAYS     Performed at [facility]   Report Status 05/08/2013 FINAL   Final  GRAM STAIN     Status: None   Collection Time    05/04/13  2:08 PM      Result Value Range Status   Specimen Description PLEURAL   Final   Special Requests Immunocompromised   Final   Gram Stain     Final   Value: CYTOSPIN     FEW WBC PRESENT,BOTH PMN AND MONONUCLEAR     NO ORGANISMS SEEN     Gram Stain Report Called to,Read Back By and Verified With: J Maxie Better RN [phone] 05/04/13 A NAVARRO   Report Status 05/04/2013 FINAL   Final  MRSA PCR SCREENING     Status: None   Collection Time    05/04/13  5:23 PM      Result Value Range Status   MRSA by PCR NEGATIVE  NEGATIVE Final   Comment:            The GeneXpert MRSA Assay (FDA     approved for NASAL specimens     only), is one component of a     comprehensive MRSA colonization     surveillance program. It is not     intended to diagnose MRSA     infection nor to guide or     monitor treatment for     MRSA infections.  CLOSTRIDIUM DIFFICILE BY PCR     Status: None   Collection Time    05/06/13 12:36 AM      Result Value Range Status   C difficile by pcr NEGATIVE  NEGATIVE Final    Comment: Performed at [place]     Status: None   Collection Time    05/06/13 12:36 AM      Result Value Range Status   Specimen Description STOOL   Final   Special Requests Normal   Final   Culture     Final   Value: NO SALMONELLA, SHIGELLA, CAMPYLOBACTER, YERSINIA, OR E.COLI 0157:H7 ISOLATED     Performed at [facility]   Report Status 05/09/2013 FINAL   Final  URINE CULTURE     Status: None   Collection Time    05/06/13 11:16 AM      Result Value Range Status   Specimen Description URINE, CLEAN CATCH   Final   Special Requests NONE   Final   Culture  Setup Time     Final   Value: 05/06/2013 13:47     Performed at [place]     Final   Value: NO GROWTH     Performed at [facility]   Culture     Final   Value: NO GROWTH     Performed at [facility]   Report Status 05/07/2013 FINAL   Final  Scheduled Meds: . aspirin EC  81 mg Oral Daily  . atorvastatin  40 mg Oral Daily  . Canagliflozin  1 tablet Oral Daily  . cholecalciferol  5,000 Units Oral Daily  . ciprofloxacin  500 mg Oral BID  . diclofenac  1 patch Transdermal BID  . dronabinol  5 mg Oral BID AC  . DULoxetine  60 mg Oral Daily  . enoxaparin (LOVENOX) injection  1 mg/kg Subcutaneous Q12H  . enoxaparin   Does not apply Once  . feeding supplement (ENSURE COMPLETE)  237 mL Oral TID BM  . insulin aspart  0-9 Units Subcutaneous TID WC  . insulin glargine  10 Units Subcutaneous QHS  . lidocaine-prilocaine  1 application Topical Once  . metFORMIN  500 mg Oral Q breakfast  . metoprolol succinate  25 mg Oral Daily  . morphine  30 mg Oral Q12H  . niacin  1,000 mg Oral QHS  . ondansetron  4 mg Oral Q8H  . pantoprazole  40 mg Oral Daily  . protein supplement  1 scoop Oral TID WC  . sodium chloride  10-40 mL Intracatheter Q12H  . sodium chloride  3 mL Intravenous Q12H  . Zinc  10 mg Mouth/Throat Daily   Continuous Infusions:     Domenic Polite, MD  [facility] Pager [phone]  If 7PM-7AM, please contact night-coverage [email or web address] Password [email or web address] 05/13/2013, 12:15 PM   LOS: 2 days

## 2013-05-13 NOTE — Progress Notes (Signed)
Called to bedside for assessment of pleur-x drain.  Drain site intact, connections in place and secured.  Yellow fluid in tubing.  No pain or discomfort.  Patient denies shortness of breath at present.  Will continue to monitor site / drainage.   [name], NP-C [facility] Pgr: [phone] or [phone]

## 2013-05-13 NOTE — Progress Notes (Addendum)
Pt given info on Lovenox and instructed how to give himself the injection in his abdomen. He did a great job. Wife was not there to teach. JP drain put to suction 20, by ICU nurse. JP drained this afternoon with pleurex, drained 400 ml yellowish drainage and a lot of foam.

## 2013-05-14 ENCOUNTER — Inpatient Hospital Stay (HOSPITAL_COMMUNITY): Payer: BC Managed Care – PPO

## 2013-05-14 DIAGNOSIS — C259 Malignant neoplasm of pancreas, unspecified: Secondary | ICD-10-CM

## 2013-05-14 LAB — GLUCOSE, CAPILLARY
Glucose-Capillary: 133 mg/dL — ABNORMAL HIGH (ref 70–99)
Glucose-Capillary: 87 mg/dL (ref 70–99)
Glucose-Capillary: 109 mg/dL — ABNORMAL HIGH (ref 70–99)
Glucose-Capillary: 113 mg/dL — ABNORMAL HIGH (ref 70–99)

## 2013-05-14 LAB — BASIC METABOLIC PANEL
BUN: 9 mg/dL (ref 6–23)
CALCIUM: 7.8 mg/dL — AB (ref 8.4–10.5)
CO2: 25 meq/L (ref 19–32)
CREATININE: 0.49 mg/dL — AB (ref 0.50–1.35)
Chloride: 95 mEq/L — ABNORMAL LOW (ref 96–112)
GFR calc Af Amer: >90 mL/min (ref >90)
Glucose, Bld: 144 mg/dL — ABNORMAL HIGH (ref 70–99)
Potassium: 4 mEq/L (ref 3.7–5.3)
SODIUM: 132 meq/L — AB (ref 137–147)

## 2013-05-14 LAB — CBC
HEMATOCRIT: 35.9 % — AB (ref 39.0–52.0)
HEMOGLOBIN: 11.8 g/dL — AB (ref 13.0–17.0)
MCH: 28.1 pg (ref 26.0–34.0)
MCHC: 32.9 g/dL (ref 30.0–36.0)
MCV: 85.5 fL (ref 78.0–100.0)
Platelets: 399 10*3/uL (ref 150–400)
RBC: 4.2 MIL/uL — ABNORMAL LOW (ref 4.22–5.81)
RDW: 15.4 % (ref 11.5–15.5)
WBC: 18.8 10*3/uL — ABNORMAL HIGH (ref 4.0–10.5)

## 2013-05-14 MED ORDER — HYDROCERIN EX CREA
TOPICAL_CREAM | Freq: Two times a day (BID) | CUTANEOUS | Status: DC
  Administered 2013-05-15 (×2): via TOPICAL
  Filled 2013-05-14: qty 113

## 2013-05-14 NOTE — Consult Note (Signed)
WOC wound consult note Reason for Consult:Dry skin on bilateral feet.  Heels with fissures on right Wound type:Three fissures on right heel resulting from dry skin.  Patient does not moisturize tissues. Pressure Ulcer POA: No Measurement:three fissures, the longest of which measures 1.5cm x 0.2cm x 0.2cm Wound CXK:GYJEHU to visualize Drainage (amount, consistency, odor) none Periwound:intact, dry Dressing procedure/placement/frequency:I will implement a plan for twice daily cleansing with an aloe cleanser following by gentle pat dry.  Application of Eucerin cream to the feet and heels is followed by dressing and elevation via our pressure redistribution boot product, Prevalon.  We will ask nursing to perform twice daily. [place] nursing team will not follow, but will remain available to this patient, the nursing and medical teams.  Please re-consult if needed. Thanks, Maudie Flakes, MSN, RN, [place], [place], [place] [phone])

## 2013-05-14 NOTE — Progress Notes (Signed)
ANTICOAGULATION CONSULT NOTE - Follow Up  Pharmacy Consult for Lovenox Indication: PE  No Known Allergies  Patient Measurements: Height: 6\' 2"  (188 cm) Weight: 188 lb 7.9 oz (85.5 kg) IBW/kg (Calculated) : 82.2  Labs:  Recent Labs  05/11/13 1405 05/11/13 1900 05/12/13 0615 05/13/13 0425 05/14/13 0427  HGB 12.4*  --  11.6* 11.5* 11.8*  HCT 35.4*  --  33.9* 34.2* 35.9*  PLT 302  --  320 364 399  APTT  --  37  --   --   --   LABPROT  --  14.6  --   --   --   INR  --  1.16  --   --   --   CREATININE 0.53  --  0.50 0.47* 0.49*  TROPONINI <0.30  --   --   --   --    Estimated Creatinine Clearance: 119.9 ml/min (by C-G formula based on Cr of 0.49).  Assessment: 89 yoM with PMHx CAD, HLD, DMT2, ventral hernia, and stage IV metastatic pancreatic cancer receiving chemotherapy through [facility], last on 1/15.  Recent admission for malignant pleural effusions and pneumothorax with pleural catheter placement.  Presents to ED with SOB.  CTA chest shows possible small PE in LLL, large residual hydropneumothorax and pleural effusion despite previous pleural drainage.   Pharmacy consulted to dose lovenox for PE.  First dose lovenox 90mg  SQ 1/26 at 1700.  Dose change to 85mg  SQ q12  Day 3 Lovenox, renal function wnl. CBC wnl, no sign of bleed.  Goal of Therapy:  Anti-Xa level 0.6-1 units/ml 4hrs after LMWH dose given Monitor platelets by anticoagulation protocol: Yes   Plan:   Continue Lovenox 85mg  SQ q12  Consider dose adjustment to 1.5mg /kg q24 hr = Lovenox 130mg  daily  Monitor CBC, sign/sx of bleed  Minda Ditto PharmD Pager [phone] 05/14/2013, 11:07 AM

## 2013-05-14 NOTE — Progress Notes (Addendum)
Patient ID: Jimmy Christensen, male   DOB: 1956-03-25, 58 y.o.   MRN: [id number]  [facility] PROGRESS NOTE  Jimmy Christensen HQI:[id number] DOB: [birth year] DOA: 05/11/2013 PCP: [name], MD  Brief narrative: 58 year old male with past medical history of pancreatic cancer (undergoing treatment at cancer center in [place] but was seen previously by Dr. [name]) and related malignant pleural effusion with right side pleur-x catheter placement 1 week prior to previous admission, recent admission (on 05/04/2013 for trapped lung due to malignant pleural effusion), hypertension, DM, pneumothorax presented to [facility] ED 05/11/2013 with complaints of worsening shortness of breath for past 24 hours prior to this admission.  Assessment and Plan:   Small Pulmonary embolus  - continue Lovenox per pharmacy protocol  -lovenox teaching ongoing -DC home on long term lovenox  Moderate to large R pneumothorax -appreciate Pulm consult -unfortunately underlying trapped lung and hence unchanged with under water seal -pleurx was being drained every other day by wife -DC suction?, defer to pulm  CAD (coronary artery disease)  - continue aspirin, statin  Pancreatic cancer with malignant pleural effusions and ascites  - wants to continue Chemo at Cancer treatments centers in [place], s/p 2 cycles -continue MS Contin, cymbalta and dilaudid PRN -had palliative meeting last week, plan for aggressive scope of treatment  Diabetes mellitus type 2  - continue canaglifozin 100 mg PO daily  - continue lantus 10 units at bedtime, SSI - stable   Protein-calorie malnutrition, severe  - secondary to history of pancreatic cancer  - nutrition consulted and recommendations appreciated  - continue ensure supplements   Anemia of chronic disease  - secondary to history of malignancy   Leukocytosis, unspecified  - likely from acute pulmonary embolism, afebrile  - defer antibiotic treatment for now as no clear infectious etiology  identified   Code Status: Full Family Communication: Pt and family at bedside Disposition Plan: Home tomorrow  Radiological Exams on Admission: Dg Chest 2 View  05/11/2013 IMPRESSION: 1. The right hydropneumothorax on the right is unchanged and likely occupies at least 50-60% of the lung volume. The right-sided small caliber pleural drainage tube is stable in position. 2. A small left pleural effusion is not significantly changed from the previous study. Electronically Signed By: David Martinique On: 05/11/2013 14:34  Ct Angio Chest Pe W/cm &/or Wo Cm  05/11/2013 IMPRESSION: 1. Possible small pulmonary embolism identified in a left lower lobe posterior basilar segmental branch. 2. Persistent "trapped" appearance of the right lung with lack of expansion despite previous pleural drain placement. There is a large residual hydropneumothorax. Electronically Signed By: Aletta Edouard M.D. On: 05/11/2013 15:35  Consultants:  None  Antibiotics:  Cipro 01/23 for SBP, started on last admission 01/23 and recommendation per GI is to complete for total 2 weeks    HPI/Subjective: Breathing better, intermittent pain  Objective: Filed Vitals:   05/13/13 0628 05/13/13 1424 05/13/13 2051 05/14/13 0516  BP: 100/64 92/61 113/72 103/67  Pulse: 100 98 97 105  Temp: 98.1 F (36.7 C) 97.8 F (36.6 C) 98.1 F (36.7 C) 98.1 F (36.7 C)  TempSrc: Oral Oral Oral Oral  Resp: 20 18 18 16   Height:      Weight: 85.5 kg (188 lb 7.9 oz)     SpO2: 92% 93% 92% 93%    Intake/Output Summary (Last 24 hours) at 05/14/13 1128 Last data filed at 05/14/13 0700  Gross per 24 hour  Intake    440 ml  Output   1100 ml  Net   -660 ml    Exam:   General:  Thinly built male, no distress  Cardiovascular: Regular rhythm, tachycardic, S1/S2, no murmurs, no rubs, no gallops  Respiratory: diminished on R  Abdomen: Soft, tender in epigastric area, non distended, bowel sounds present, no guarding  Extremities: No edema,  pulses DP and PT palpable bilaterally  Neuro: Grossly nonfocal  Data Reviewed: Basic Metabolic Panel:  Recent Labs Lab 05/08/13 0500 05/11/13 1405 05/11/13 1900 05/12/13 0615 05/13/13 0425 05/14/13 0427  NA 129* 129*  --  133* 131* 132*  K 4.1 4.1  --  3.7 3.7 4.0  CL 90* 90*  --  93* 95* 95*  CO2 26 24  --  21 24 25   GLUCOSE 134* 116*  --  84 100* 144*  BUN 13 14  --  13 10 9   CREATININE 0.54 0.53  --  0.50 0.47* 0.49*  CALCIUM 8.3* 8.6  --  8.4 7.8* 7.8*  MG  --   --  1.6  --   --   --   PHOS  --   --  2.7  --   --   --    Liver Function Tests:  Recent Labs Lab 05/11/13 1405 05/12/13 0615  AST 29 28  ALT 21 21  ALKPHOS 96 83  BILITOT 0.5 0.4  PROT 5.4* 5.2*  ALBUMIN 1.7* 1.6*   CBC:  Recent Labs Lab 05/11/13 1405 05/12/13 0615 05/13/13 0425 05/14/13 0427  WBC 19.6* 18.6* 17.5* 18.8*  NEUTROABS 12.7*  --   --   --   HGB 12.4* 11.6* 11.5* 11.8*  HCT 35.4* 33.9* 34.2* 35.9*  MCV 82.9 83.7 84.9 85.5  PLT 302 320 364 399   Cardiac Enzymes:  Recent Labs Lab 05/11/13 1405  TROPONINI <0.30   CBG:  Recent Labs Lab 05/13/13 0752 05/13/13 1118 05/13/13 1719 05/13/13 2053 05/14/13 0733  GLUCAP 111* 128* 106* 80 133*    Recent Results (from the past 240 hour(s))  CULTURE, BLOOD (ROUTINE X 2)     Status: None   Collection Time    05/04/13 11:35 AM      Result Value Range Status   Specimen Description BLOOD LEFT HAND   Final   Special Requests BOTTLES DRAWN AEROBIC AND ANAEROBIC 3ML   Final   Culture  Setup Time     Final   Value: 05/04/2013 14:07     Performed at [facility]   Culture     Final   Value: NO GROWTH 5 DAYS     Performed at [facility]   Report Status 05/10/2013 FINAL   Final  CULTURE, BLOOD (ROUTINE X 2)     Status: None   Collection Time    05/04/13 11:40 AM      Result Value Range Status   Specimen Description BLOOD RIGHT HAND   Final   Special Requests BOTTLES DRAWN AEROBIC ONLY 3ML   Final   Culture   Setup Time     Final   Value: 05/04/2013 14:07     Performed at [facility]   Culture     Final   Value: NO GROWTH 5 DAYS     Performed at [facility]   Report Status 05/10/2013 FINAL   Final  BODY FLUID CULTURE     Status: None   Collection Time    05/04/13  2:08 PM      Result Value Range Status   Specimen  Description PLEURAL   Final   Special Requests Immunocompromised   Final   Gram Stain     Final   Value: CYTOSPIN FEW WBC PRESENT,BOTH PMN AND MONONUCLEAR     NO ORGANISMS SEEN     Performed by [facility] Gram Stain Report Called to,Read Back By and Verified With: Gram Stain Report Called to,Read Back By and Verified With:  Burman Nieves RN AT [id number] ON01/19/15 BY A NAVARRO     Performed at [facility]   Culture     Final   Value: NO GROWTH 3 DAYS     Performed at [facility]   Report Status 05/08/2013 FINAL   Final  GRAM STAIN     Status: None   Collection Time    05/04/13  2:08 PM      Result Value Range Status   Specimen Description PLEURAL   Final   Special Requests Immunocompromised   Final   Gram Stain     Final   Value: CYTOSPIN     FEW WBC PRESENT,BOTH PMN AND MONONUCLEAR     NO ORGANISMS SEEN     Gram Stain Report Called to,Read Back By and Verified With: J Maxie Better RN [phone] 05/04/13 A NAVARRO   Report Status 05/04/2013 FINAL   Final  MRSA PCR SCREENING     Status: None   Collection Time    05/04/13  5:23 PM      Result Value Range Status   MRSA by PCR NEGATIVE  NEGATIVE Final   Comment:            The GeneXpert MRSA Assay (FDA     approved for NASAL specimens     only), is one component of a     comprehensive MRSA colonization     surveillance program. It is not     intended to diagnose MRSA     infection nor to guide or     monitor treatment for     MRSA infections.  CLOSTRIDIUM DIFFICILE BY PCR     Status: None   Collection Time    05/06/13 12:36 AM      Result Value Range Status   C difficile by pcr NEGATIVE   NEGATIVE Final   Comment: Performed at [place]     Status: None   Collection Time    05/06/13 12:36 AM      Result Value Range Status   Specimen Description STOOL   Final   Special Requests Normal   Final   Culture     Final   Value: NO SALMONELLA, SHIGELLA, CAMPYLOBACTER, YERSINIA, OR E.COLI 0157:H7 ISOLATED     Performed at [facility]   Report Status 05/09/2013 FINAL   Final  URINE CULTURE     Status: None   Collection Time    05/06/13 11:16 AM      Result Value Range Status   Specimen Description URINE, CLEAN CATCH   Final   Special Requests NONE   Final   Culture  Setup Time     Final   Value: 05/06/2013 13:47     Performed at [place]     Final   Value: NO GROWTH     Performed at [facility]   Culture     Final   Value: NO GROWTH     Performed at [facility]   Report Status 05/07/2013 FINAL  Final     Scheduled Meds: . aspirin EC  81 mg Oral Daily  . atorvastatin  40 mg Oral Daily  . Canagliflozin  1 tablet Oral Daily  . cholecalciferol  5,000 Units Oral Daily  . ciprofloxacin  500 mg Oral BID  . diclofenac  1 patch Transdermal BID  . dronabinol  5 mg Oral BID AC  . DULoxetine  60 mg Oral Daily  . enoxaparin (LOVENOX) injection  1 mg/kg Subcutaneous Q12H  . feeding supplement (ENSURE COMPLETE)  237 mL Oral TID BM  . insulin aspart  0-9 Units Subcutaneous TID WC  . insulin glargine  10 Units Subcutaneous QHS  . lidocaine-prilocaine  1 application Topical Once  . metFORMIN  500 mg Oral Q breakfast  . metoprolol succinate  25 mg Oral Daily  . morphine  30 mg Oral Q12H  . niacin  1,000 mg Oral QHS  . ondansetron  4 mg Oral Q8H  . pantoprazole  40 mg Oral Daily  . protein supplement  1 scoop Oral TID WC  . sodium chloride  10-40 mL Intracatheter Q12H  . sodium chloride  3 mL Intravenous Q12H  . Zinc  10 mg Mouth/Throat Daily   Continuous Infusions:    Domenic Polite,  MD  [facility] Pager [phone]  If 7PM-7AM, please contact night-coverage [email or web address] Password [email or web address] 05/14/2013, 11:28 AM   LOS: 3 days

## 2013-05-14 NOTE — Progress Notes (Signed)
Name: Jimmy Christensen MRN: [id number] DOB: [birth year]    ADMISSION DATE:  05/11/2013 CONSULTATION DATE: 1/28  REFERRING MD :  triad PRIMARY SERVICE:Triad  CHIEF COMPLAINT:  Dyspnea   BRIEF PATIENT DESCRIPTION:  58 yo former smoker (22-46 1 ppd) former silicone Geneticist, molecular, Dx with pancreatic cancer 12-14. He has been receiving chemotherapy in [place] / STUDIES:    LINES / TUBES: Rt porta cath,  Rt pleuryx cath  CULTURES: Reviewed all negative  ANTIBIOTICS: 1/26 cipro>>  HISTORY OF PRESENT ILLNESS:   58 yo former smoker (22-46 1 ppd) former silicone Geneticist, molecular, Dx with pancreatic cancer 12-14. He has been receiving chemotherapy in [place]. And had rt chest tube placed 2 weeks ago with 6000cc drained(per pt) and 400-600 cc qod drained.  He has had 2 rounds of chemotherapy and PCCM asked to see him for dyspnea. CT reveals small LLL pe along with extensive trapped rt lung and pleural effusions. PCCm asked to evaluate  SUBJECTIVE:  Rt chest to 20 cm suction. No air leak.  VITAL SIGNS: Temp:  [97.8 F (36.6 C)-98.1 F (36.7 C)] 98.1 F (36.7 C) (01/29 0516) Pulse Rate:  [97-105] 105 (01/29 0516) Resp:  [16-18] 16 (01/29 0516) BP: (92-113)/(61-72) 103/67 mmHg (01/29 0516) SpO2:  [92 %-93 %] 93 % (01/29 0516) HEMODYNAMICS:   VENTILATOR SETTINGS:   INTAKE / OUTPUT: Intake/Output     01/28 0701 - 01/29 0700 01/29 0701 - 01/30 0700   P.O. 680    I.V. (mL/kg) 20 (0.2)    Total Intake(mL/kg) 700 (8.2)    Urine (mL/kg/hr) 550 (0.3)    Chest Tube 700 (0.3)    Total Output 1250     Net -550            PHYSICAL EXAMINATION: General:  Wasted WM with increased wob Neuro:  Intact HEENT:  No LAN/JVD Cardiovascular:  HSR RRR Lungs:  Decreased air movement , dull in bases. Rt chest tube to suction 500 cc of bloody/serous drainage Abdomen:  + ascites Musculoskeletal: wasted muculature Skin:  warm  LABS:  CBC  Recent Labs Lab 05/12/13 0615  05/13/13 0425 05/14/13 0427  WBC 18.6* 17.5* 18.8*  HGB 11.6* 11.5* 11.8*  HCT 33.9* 34.2* 35.9*  PLT 320 364 399   Coag's  Recent Labs Lab 05/11/13 1900  APTT 37  INR 1.16   BMET  Recent Labs Lab 05/12/13 0615 05/13/13 0425 05/14/13 0427  NA 133* 131* 132*  K 3.7 3.7 4.0  CL 93* 95* 95*  CO2 21 24 25   BUN 13 10 9   CREATININE 0.50 0.47* 0.49*  GLUCOSE 84 100* 144*   Electrolytes  Recent Labs Lab 05/11/13 1405 05/11/13 1900 05/12/13 0615 05/13/13 0425 05/14/13 0427  CALCIUM 8.6  --  8.4 7.8* 7.8*  MG  --  1.6  --   --   --   PHOS  --  2.7  --   --   --    Sepsis Markers No results found for this basename: LATICACIDVEN, PROCALCITON, O2SATVEN,  in the last 168 hours ABG No results found for this basename: PHART, PCO2ART, PO2ART,  in the last 168 hours Liver Enzymes  Recent Labs Lab 05/11/13 1405 05/12/13 0615  AST 29 28  ALT 21 21  ALKPHOS 96 83  BILITOT 0.5 0.4  ALBUMIN 1.7* 1.6*   Cardiac Enzymes  Recent Labs Lab 05/11/13 1405  TROPONINI <0.30   Glucose  Recent Labs Lab 05/12/13 2236 05/13/13  5784 05/13/13 1118 05/13/13 1719 05/13/13 2053 05/14/13 0733  GLUCAP 126* 111* 128* 106* 80 133*    Imaging Dg Chest Port 1 View  05/14/2013   CLINICAL DATA:  Right pneumothorax.  EXAM: PORTABLE CHEST - 1 VIEW  COMPARISON:  05/13/2013 and 05/11/2013  FINDINGS: Power port in place. Right pneumothorax is unchanged. PleurX catheter present at the right lung base. Persistent chronic atelectasis at the right lung base.  No change in the left pleural effusion.  IMPRESSION: No change since the prior study. Persistent right pneumothorax and left pleural effusion.   Electronically Signed   By: Rozetta Nunnery M.D.   On: 05/14/2013 08:04   Dg Chest Port 1 View  05/13/2013   CLINICAL DATA:  Follow-up of pneumothorax  EXAM: PORTABLE CHEST - 1 VIEW  COMPARISON:  CT ANGIO CHEST W/CM &/OR WO/CM dated 05/11/2013; DG CHEST 2 VIEW dated 05/11/2013  FINDINGS: The  patient has a known right-sided hydro pneumothorax. The pneumothorax has slightly decreased in size since the previous study but still likely occupies the least 30-40% of the lung volume. The small caliber right-sided chest tube tip is stable in position with the tip projected inferiorly in the medial aspect of the hemithorax. Density at the right lung base is consistent with known pleural fluid and there is consolidated lung parenchyma in the right infrahilar region which is stable.  The interstitial markings on the left have become more conspicuous since the earlier study. A portion of this may be related fluid in the major fissure. There is a persistent moderate size left pleural effusion. The cardiac silhouette is normal in size. The Port-A-Cath appliance tip lies in the region of the mid to distal SVC.  IMPRESSION: 1. The known large right hydropneumothorax has slightly decreased in size but still occupies at least 40-50% of the lung volume. The position of the right-sided chest tube is unchanged. 2. The pulmonary interstitial markings within the left lung are more conspicuous today. The left-sided pleural effusion is unchanged. 3. There is no evidence of CHF.   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  Therefore, first step will be to hook up pleurex to suction via a waterseal and repeat CXR.  If the lung remains deflated then the  patient has a trapped lung there is very little to be done other than accept that is it is there.  If the lung does expand then we will need to test the catheter for damage or observe the patient's drainage process to see if he is allowing air into his chest cavity while draining. 1/29 24 hours on chest tube suction and chest x ray is unchanged with PNX still present. Little to offer at this point. MD to elaborate.   Richardson Landry Minor ACNP [facility] PCCM Pager [phone] till 3 pm If no answer page [phone] 05/14/2013, 9:25 AM  STAFF NOTE: I, Dr Ann Lions have personally reviewed patient's available data, including medical history, events of note, physical examination and test results as part of my evaluation. I have discussed with resident/NP and other care providers such as pharmacist, RN and RRT.  In addition,  I personally evaluated patient and elicited key findings of oancreati cancer patient livuing in [place] but flyin to [place] for chemo. Now with PE and also s/p Pl;eurx 2 weeks ago for rt efusion with resultant significant improvement in dyspnea. Per his hx post pleurX and prior to admission he drains around 200cc every few days (possibly inaccurate infor). Currently admitted for dyspnea and found to have PE. However, pleural space shows trapped lung with hydroptx. This is not amenable to intervention. HE thinks surgery can fix it but feels he wants to go trhough chemo first in [place]  Before deciding on surgical consult. AT this time, we can remove pleurX to fill up empty space but he does not want it do that due to perceived benefit of pleurX.  Rest per NP/medical resident whose note is outlined above and that I agree with     Dr. Brand Males, M.D., [facility]C.P Pulmonary and Critical Care Medicine Staff Physician [place] [facility] Pager: [phone], If no answer or between  15:00h - 7:00h: call [phone]  05/14/2013 12:51 PM

## 2013-05-15 ENCOUNTER — Inpatient Hospital Stay (HOSPITAL_COMMUNITY): Payer: BC Managed Care – PPO

## 2013-05-15 LAB — GLUCOSE, CAPILLARY
GLUCOSE-CAPILLARY: 73 mg/dL (ref 70–99)
Glucose-Capillary: 96 mg/dL (ref 70–99)

## 2013-05-15 MED ORDER — MORPHINE SULFATE ER 15 MG PO TBCR
15.0000 mg | EXTENDED_RELEASE_TABLET | Freq: Once | ORAL | Status: AC
  Administered 2013-05-15: 15 mg via ORAL
  Filled 2013-05-15: qty 1

## 2013-05-15 MED ORDER — HEPARIN SOD (PORK) LOCK FLUSH 100 UNIT/ML IV SOLN
500.0000 [IU] | INTRAVENOUS | Status: AC | PRN
  Administered 2013-05-15: 500 [IU]

## 2013-05-15 MED ORDER — ENOXAPARIN SODIUM 100 MG/ML ~~LOC~~ SOLN
90.0000 mg | Freq: Two times a day (BID) | SUBCUTANEOUS | Status: DC
  Administered 2013-05-15: 90 mg via SUBCUTANEOUS
  Filled 2013-05-15 (×2): qty 1

## 2013-05-15 MED ORDER — MORPHINE SULFATE ER 30 MG PO TBCR: 45.0000 mg | EXTENDED_RELEASE_TABLET | Freq: Two times a day (BID) | ORAL | Status: DC

## 2013-05-15 MED ORDER — DRONABINOL 5 MG PO CAPS: 5.0000 mg | ORAL_CAPSULE | Freq: Two times a day (BID) | ORAL | Status: AC

## 2013-05-15 MED ORDER — MORPHINE SULFATE ER 15 MG PO TBCR: 45.0000 mg | EXTENDED_RELEASE_TABLET | Freq: Two times a day (BID) | ORAL | Status: DC

## 2013-05-15 MED ORDER — RESOURCE INSTANT PROTEIN PO PWD PACKET: 1.0000 | Freq: Three times a day (TID) | ORAL | Status: AC

## 2013-05-15 MED ORDER — ENOXAPARIN SODIUM 100 MG/ML ~~LOC~~ SOLN: 90.0000 mg | Freq: Two times a day (BID) | SUBCUTANEOUS | Status: DC

## 2013-05-15 NOTE — Progress Notes (Signed)
ANTICOAGULATION CONSULT NOTE - Follow Up  Pharmacy Consult for Lovenox Indication: PE  No Known Allergies  Patient Measurements: Height: 6\' 2"  (188 cm) Weight: 196 lb 13.9 oz (89.3 kg) IBW/kg (Calculated) : 82.2  Labs:  Recent Labs  05/13/13 0425 05/14/13 0427  HGB 11.5* 11.8*  HCT 34.2* 35.9*  PLT 364 399  CREATININE 0.47* 0.49*   Estimated Creatinine Clearance: 119.9 ml/min (by C-G formula based on Cr of 0.49).  Assessment: 25 yoM with PMHx CAD, HLD, DMT2, ventral hernia, and stage IV metastatic pancreatic cancer receiving chemotherapy through [place], last reportedly administered on 1/15.  Recent admission for malignant pleural effusions and pneumothorax with pleural catheter placement.  Presents to ED with SOB.  CTA chest showed possible small PE in LLL, large residual hydropneumothorax and pleural effusion despite previous pleural drainage. Pharmacy was asked to dose Lovenox for PE.  1/30: Large (~30#) increase in weight for last two days using bed scale compared to previous weights.  RN had patient weighed standing on scale:  89.3 kg, much closer to previously recorded values and to patient's stated home weight - will use this for dosing.    No reports of bleeding.    Goal of Therapy:  Anti-Xa level 0.6-1 units/ml 4hrs after LMWH dose given Monitor platelets by anticoagulation protocol: Yes   Plan:   Increase Lovenox to 90 mg SQ q12h   Monitor CBC, sign/sx of bleed  Clayburn Pert, PharmD, BCPS Pager: [phone] 05/15/2013  8:02 AM

## 2013-05-15 NOTE — Progress Notes (Signed)
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 He has been receiving chemotherapy in [place]. And had rt chest tube placed 2 weeks ago with 6000cc drained(per pt) and 400-600 cc qod drained.  He has had 2 rounds of chemotherapy and PCCM asked to see him for dyspnea. CT reveals small LLL pe along with extensive trapped rt lung and pleural effusions. PCCm asked to evaluate  SUBJECTIVE:  Rt chest to 20 cm suction. No air leak. Pleurvac has 2000 cc of serous drainage  VITAL SIGNS: Temp:  [98 F (36.7 C)-98.2 F (36.8 C)] 98.2 F (36.8 C) (01/30 0500) Pulse Rate:  [92-104] 92 (01/30 0500) Resp:  [16-18] 16 (01/30 0500) BP: (101-103)/(62-67) 101/66 mmHg (01/30 0500) SpO2:  [93 %-94 %] 94 % (01/30 0500) Weight:  [196 lb 13.9 oz (89.3 kg)-217 lb 13 oz (98.8 kg)] 196 lb 13.9 oz (89.3 kg) (01/30 0500) HEMODYNAMICS:   VENTILATOR SETTINGS:   INTAKE / OUTPUT: Intake/Output     01/29 0701 - 01/30 0700 01/30 0701 - 01/31 0700   P.O. 120 120   I.V. (mL/kg)     Total Intake(mL/kg) 120 (1.3) 120 (1.3)   Urine (mL/kg/hr) 1350 (0.6)    Stool  1 (0)   Chest Tube 500 (0.2)    Total Output 1850 1   Net -1730 +119          PHYSICAL EXAMINATION: General:  Wasted WM with increased wob Neuro:  Intact HEENT:  No LAN/JVD Cardiovascular:  HSR RRR Lungs:  Decreased air movement , dull in bases. Rt chest tube to  suction 2000 cc of serous drainage Abdomen:  + ascites Musculoskeletal: wasted muculature Skin:  warm  LABS:  CBC  Recent Labs Lab 05/12/13 0615 05/13/13 0425 05/14/13 0427  WBC 18.6* 17.5* 18.8*  HGB 11.6* 11.5* 11.8*  HCT 33.9* 34.2* 35.9*  PLT 320 364 399   Coag's  Recent Labs Lab 05/11/13 1900  APTT 37  INR 1.16   BMET  Recent Labs Lab 05/12/13 0615 05/13/13 0425 05/14/13 0427  NA 133* 131* 132*  K 3.7 3.7 4.0  CL 93* 95* 95*  CO2 _0 BUN _1 CREATININE 0.50 0.47* 0.49*  GLUCOSE 84 100* 144*   Electrolytes  Recent Labs Lab 05/11/13 1405 05/11/13 1900 05/12/13 0615 05/13/13 0425 05/14/13 0427  CALCIUM 8.6  --  8.4 7.8* 7.8*  MG  --  1.6  --   --   --   PHOS  --  2.7  --   --   --    Sepsis Markers No results found for this basename: LATICACIDVEN, PROCALCITON, O2SATVEN,  in the last 168 hours ABG No results found for this basename: PHART, PCO2ART, PO2ART,  in the last 168 hours Liver Enzymes  Recent Labs Lab 05/11/13 1405 05/12/13 0615  AST 29 28  ALT 21 21  ALKPHOS 96  83  BILITOT 0.5 0.4  ALBUMIN 1.7* 1.6*   Cardiac Enzymes  Recent Labs Lab 05/11/13 1405  TROPONINI <0.30   Glucose  Recent Labs Lab 05/14/13 0733 05/14/13 1204 05/14/13 1612 05/14/13 2051 05/15/13 0804 05/15/13 1141  GLUCAP 133* 87 109* 113* 73 96    Imaging Dg Chest Port 1 View  05/14/2013   CLINICAL DATA:  Right pneumothorax.  EXAM: PORTABLE CHEST - 1 VIEW  COMPARISON:  05/13/2013 and 05/11/2013  FINDINGS: Power port in place. Right pneumothorax is unchanged. PleurX catheter present at the right lung base. Persistent chronic atelectasis at the right lung base.  No change in the left pleural effusion.  IMPRESSION: No change since the prior study. Persistent right pneumothorax and left pleural effusion.   Electronically Signed   By: Rozetta Nunnery M.D.   On: 05/14/2013 08:04   Dg Chest Port 1 View  05/13/2013   CLINICAL DATA:  Follow-up of  pneumothorax  EXAM: PORTABLE CHEST - 1 VIEW  COMPARISON:  CT ANGIO CHEST W/CM &/OR WO/CM dated 05/11/2013; DG CHEST 2 VIEW dated 05/11/2013  FINDINGS: The patient has a known right-sided hydro pneumothorax. The pneumothorax has slightly decreased in size since the previous study but still likely occupies the least 30-40% of the lung volume. The small caliber right-sided chest tube tip is stable in position with the tip projected inferiorly in the medial aspect of the hemithorax. Density at the right lung base is consistent with known pleural fluid and there is consolidated lung parenchyma in the right infrahilar region which is stable.  The interstitial markings on the left have become more conspicuous since the earlier study. A portion of this may be related fluid in the major fissure. There is a persistent moderate size left pleural effusion. The cardiac silhouette is normal in size. The Port-A-Cath appliance tip lies in the region of the mid to distal SVC.  IMPRESSION: 1. The known large right hydropneumothorax has slightly decreased in size but still occupies at least 40-50% of the lung volume. The position of the right-sided chest tube is unchanged. 2. The pulmonary interstitial markings within the left lung are more conspicuous today. The left-sided pleural effusion is unchanged. 3. There is no evidence of CHF.   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  Therefore, first step will be to hook up pleurex to suction via a waterseal and repeat CXR.  If the lung remains deflated then the patient has a trapped lung there is very little to be done other than accept that is it is there.  If the lung does expand then we will need to test the catheter for damage or observe the patient's drainage process to see if he is allowing air into his chest cavity while draining. 1/29 24 hours on chest tube suction and chest x ray is unchanged with PNX still present.  Little to offer at this point. MD to elaborate. 1/30 catheter capped and pleurvac dc'd Drain catheter QD till amount is <500 cc daily then change frequency to QOD. Will check chest x ray 1/30 for completeness. PCCM will sign off , please call if we are needed.    Richardson Landry Minor ACNP [facility] PCCM Pager [phone] till 3 pm If no answer page [phone] 05/15/2013, 12:10 PM   STAFF NOTE  met him and his wife. This is tough situation. Best we can hope for is perioidic drainage out of PleurX enough to give him dyspnea relief. His ECOG is 4 now. Strongly recommend palliative care consult atleast for goals of care   Dr. Brand Males, M.D., [facility].C.P Pulmonary and Critical Care Medicine Staff Physician [place] [facility] Pager: [phone], If no answer or between  15:00h - 7:00h: call [phone]  05/15/2013 12:49 PM

## 2013-05-15 NOTE — Discharge Summary (Signed)
Physician Discharge Summary  [facility] [id number] DOB: December 04, 1955 DOA: 05/11/2013  PCP: Jimmy Simmer, MD [facility] date: 05/11/2013 Discharge date: 05/15/2013  Time spent: 50 minutes  Recommendations for Outpatient Follow-up:  1. [facility] in 1 week 2. Pulmonologist in [place] in 1 week 3. 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   Hyponatremia   Diabetes mellitus type 2 in nonobese   Protein-calorie malnutrition, severe   Anemia of chronic disease   Leukocytosis, unspecified   Pleural effusion   Discharge Condition:stable  Diet recommendation: regular diet as tolerated  Filed Weights   05/13/13 0628 05/14/13 1900 05/15/13 0500  Weight: 85.5 kg (188 lb 7.9 oz) 98.8 kg (217 lb 13 oz) 89.3 kg (196 lb 13.9 oz)    History of present illness:  58 year old male with past medical history of pancreatic Jimmy (undergoing treatment at [facility] in [place] but was seen previously by [facility]) and related malignant pleural effusion with right side pleur-x catheter placement 1 week prior to previous admission, recent admission (on 05/04/2013 for trapped lung due to malignant pleural effusion), hypertension, DM who presented to [facility] 05/11/2013 with complaints of worsening shortness of breath for past 24 hours prior to this admission. No associated fever or chills. No significant cough reported. He does experience generalized pain which includes the chest area but this is ongoing. His home medications did not provide significant pain relief.  In ED, BP was 95/50 and has improved to 108/82 with IV fluids. HR was 100 - 110, Tmax 98.1 F and oxygen saturation  93% on 2 L nasal canula oxygen support. Blood work revealed Hgb of 12.4 and sodium of 129. CT angio chest was suspicious for small pulmonary embolism   Hospital Course:  Small Pulmonary embolus  -started on lovenox -lovenox teaching ongoing  -DC home on long term lovenox   Moderate to large R pneumothorax  -was seen by Pul[facility] in consultation  -unfortunately underlying trapped lung and hence unchanged with suction/under water seal  -not really felt to be a VATS candidate at this time and patient wants to defer any further invasive management to his pulmonologist in [place]. 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  - wants to continue Chemo at [facility], s/p 2 cycles  -continue MS Contin increase dose to 45mg , cymbalta and dilaudid PRN  -had palliative meeting last week, plan for aggressive scope of treatment  -will Fu with Oncology and Pulmonary in [place] where he gets his treatment  Diabetes mellitus type 2  - continue canaglifozin 100 mg PO daily  - continue lantus 10 units at bedtime, SSI  - stable   Protein-calorie malnutrition, severe  - secondary metastatic pancreatic Jimmy  -  nutrition consulted and recommendations appreciated  - continue ensure supplements   Anemia of chronic disease  - secondary to malignancy   Leukocytosis, unspecified  -afebrile  -possibly related to PE/underlying malignancy  Procedures:  Suction of pleurx catheter  Consultations:  Pulmonary [facility]  Discharge  Exam: Filed Vitals:   05/15/13 1435  BP: 109/71  Pulse: 101  Temp: 97.7 F (36.5 C)  Resp: 18    General: AAOx3 Cardiovascular: S1S2/RRR Respiratory: diminished on R  Discharge Instructions  Discharge Orders   Future Orders Complete By Expires   Diet general  As directed    Increase activity slowly  As directed        Medication List         aspirin 81 MG EC tablet  Take 81 mg by mouth daily.     atorvastatin 40 MG tablet  Commonly known as:  LIPITOR  Take 1 tablet (40 mg total) by mouth daily.     cholecalciferol 1000 UNITS tablet  Commonly known as:  VITAMIN D  Take 5,000 Units by mouth daily.     ciprofloxacin 500 MG tablet  Commonly known as:  CIPRO  Take 1 tablet (500 mg total) by mouth 2 (two) times daily.     diclofenac 1.3 % Ptch  Commonly known as:  FLECTOR  Place 1 patch onto the skin 2 (two) times daily.     dronabinol 5 MG capsule  Commonly known as:  MARINOL  Take 1 capsule (5 mg total) by mouth 2 (two) times daily before lunch and supper.     DULoxetine 60 MG capsule  Commonly known as:  CYMBALTA  Take 60 mg by mouth daily.     enoxaparin 100 MG/ML injection  Commonly known as:  LOVENOX  Inject 0.9 mLs (90 mg total) into the skin every 12 (twelve) hours.     feeding supplement (ENSURE COMPLETE) Liqd  Take 237 mLs by mouth 3 (three) times daily between meals.     fluticasone 50 MCG/ACT nasal spray  Commonly known as:  FLONASE  Place 1 spray into both nostrils daily as needed.     hydrocortisone 2.5 % rectal cream  Commonly known as:  ANUSOL-HC  Place rectally 2 (two) times daily as needed for hemorrhoids or itching.     HYDROmorphone 2 MG tablet  Commonly known as:  DILAUDID  Take by mouth every 6 (six) hours as needed for severe pain.     insulin glargine 100 UNIT/ML injection  Commonly known as:  LANTUS  Inject 10 Units into the skin at bedtime.     INVOKANA 100 MG Tabs  Generic drug:  Canagliflozin  Take 1 tablet by mouth  daily.     lactulose 10 GM/15ML solution  Commonly known as:  CHRONULAC  Take 15 mLs (10 g total) by mouth 2 (two) times daily as needed for mild constipation or moderate constipation.     lidocaine-prilocaine cream  Commonly known as:  EMLA  Apply 1 application topically once.     metFORMIN 500 MG tablet  Commonly known as:  GLUCOPHAGE  Take 1 tablet by mouth daily.     metoprolol succinate 25 MG 24 hr tablet  Commonly known as:  TOPROL-XL  Take 1 tablet (25 mg total) by mouth daily.     morphine 15 MG 12 hr tablet  Commonly known as:  MS CONTIN  Take 3 tablets (45 mg total) by mouth every 12 (twelve) hours.     niacin 1000  MG CR tablet  Commonly known as:  NIASPAN  Take 1 tablet (1,000 mg total) by mouth at bedtime.     nitroGLYCERIN 0.4 MG SL tablet  Commonly known as:  NITROSTAT  Place 1 tablet (0.4 mg total) under the tongue every 5 (five) minutes as needed. May repeat up to 3 doses.     ondansetron 4 MG tablet  Commonly known as:  ZOFRAN  Take 4 mg by mouth every 8 (eight) hours as needed for nausea or vomiting.     pantoprazole 40 MG tablet  Commonly known as:  PROTONIX  Take 1 tablet (40 mg total) by mouth daily.     prochlorperazine 5 MG tablet  Commonly known as:  COMPAZINE  Take 5 mg by mouth every 6 (six) hours as needed for nausea or vomiting.     protein supplement 6 g Powd  Commonly known as:  RESOURCE BENEPROTEIN  Take 1 scoop (6 g total) by mouth 3 (three) times daily with meals.     Zinc 10 MG Lozg  Use as directed 10 mg in the mouth or throat daily.       No Known Allergies     Follow-up Information   Follow up with Oncologist [facility]. Schedule an appointment as soon as possible for a visit in 1 week.       The results of significant diagnostics from this hospitalization (including imaging, microbiology, ancillary and laboratory) are listed below for reference.    Significant Diagnostic Studies: Dg Chest 1 View  05/04/2013    CLINICAL DATA:  Evaluate pneumothorax, shortness of breath and chest pain  EXAM: CHEST - 1 VIEW  COMPARISON:  Earlier same day; CT abdomen pelvis - earlier same day; 03/19/2013  FINDINGS: Grossly unchanged cardiac silhouette and mediastinal contours. Stable positioning of support apparatus. There has been minimal re-expansion of the right lung with persistent small right-sided hydro pneumothorax. No significant deviation of the cardiomediastinal structures. Possible minimal increase in the amount of right-sided pleural fluid. Small amount of left-sided pleural fluid appear grossly unchanged. Improved aeration of the right lower lung with persistent bibasilar heterogeneous opacities, right greater than left. No new focal airspace opacities. No definite evidence of edema. Grossly unchanged bones.  IMPRESSION: 1. Minimal re-expansion of the right lung with persistent small right-sided hydro pneumothorax. Given the presence of a right basilar approach chest tube, this pneumothorax may be ex vacuo in etiology. Clinical correlation is advised. 2. Unchanged small left-sided pleural effusion. 3. Improved aeration of the right lung with persistent bibasilar opacities, right greater than left, atelectasis versus infiltrate.   Electronically Signed   By: Sandi Mariscal M.D.   On: 05/04/2013 14:55   Dg Chest 2 View  05/15/2013   CLINICAL DATA:  Chest tube  EXAM: CHEST  2 VIEW  COMPARISON:  DG CHEST 1V PORT dated 05/14/2013; CT ANGIO CHEST W/CM &/OR WO/CM dated 05/11/2013  FINDINGS: There is a right basilar chest tube present. There is interval enlargement of the right hydro pneumothorax. There is right lower lobe collapse. There is a small left pleural effusion. There is mild bilateral interstitial thickening. Stable cardiomediastinal silhouette. Right-sided Port-A-Cath in unchanged position. Unremarkable osseous structures.  Nonspecific gastric air-fluid level.  IMPRESSION: Interval enlargement of the right hydropneumothorax.   Right lower lobe collapse.  Findings concerning for mild underlying pulmonary edema.   Electronically Signed   By: Kathreen Devoid   On: 05/15/2013 14:00   Dg Chest 2 View  05/11/2013   CLINICAL DATA:  Cough and respiratory distress  EXAM: CHEST  2 VIEW  COMPARISON:  DG CHEST 2V dated 05/11/2013  FINDINGS: The lungs are reasonably well inflated. There is a stable moderate to large hydropneumothorax on the right. The pleural drainage catheter appears unchanged in position. There is parenchymal density in the right infrahilar region which is stable. There is a small left pleural effusion which appears stable. The cardiac silhouette is normal in size. The Port-A-Cath appliance tip lies in the region of the midportion of the SVC.  IMPRESSION: 1. The right hydropneumothorax on the right is unchanged and likely occupies at least 50-60% of the lung volume. The right-sided small caliber pleural drainage tube is stable in position. 2. A small left pleural effusion is not significantly changed from the previous study.   Electronically Signed   By: David  Martinique   On: 05/11/2013 14:34   Dg Chest 2 View  05/07/2013   CLINICAL DATA:  Shortness of breath, dyspnea  EXAM: CHEST  2 VIEW  COMPARISON:  05/06/2013; 05/05/2013; 05/04/2013  FINDINGS: Grossly unchanged cardiac silhouette and mediastinal contours. Stable positioning of support apparatus. Interval increase in now moderate amount of fluid within right-sided hydropneumothorax. The amount of air within the right pleural space is grossly unchanged. Stable positioning of right-sided chest tube.  Grossly unchanged small to moderate size left-sided effusion. Improved aeration of the bilateral lungs with persistent bibasilar opacities. No definite evidence of edema. Grossly unchanged bones.  IMPRESSION: 1. Stable positioning of support apparatus with interval increase in the now moderate amount of fluid within the right-sided possibly ex vacuo hydropneumothorax. 2. Grossly  unchanged small to moderate size left-sided pleural effusion. 3. Improved aeration of lungs with persistent bibasilar heterogeneous/consolidative opacities, likely atelectasis.   Electronically Signed   By: Sandi Mariscal M.D.   On: 05/07/2013 10:12   Ct Angio Chest Pe W/cm &/or Wo Cm  05/11/2013   CLINICAL DATA:  Worsening shortness of breath and history of pancreatic carcinoma with history of bilateral malignant pleural effusions and lack of expansion of the right lung.  EXAM: CT ANGIOGRAPHY CHEST WITH CONTRAST  TECHNIQUE: Multidetector CT imaging of the chest was performed using the standard protocol during bolus administration of intravenous contrast. Multiplanar CT image reconstructions including MIPs were obtained to evaluate the vascular anatomy.  CONTRAST:  176mL OMNIPAQUE IOHEXOL 350 MG/ML SOLN  COMPARISON:  Multiple recent chest x-ray studies with the latest dated 05/11/2013.  FINDINGS: Pulmonary arterial opacification is adequate. There is suggestion of potentially a small pulmonary embolism to the posterior basilar segmental artery of the left lower lobe. No other emboli are identified.  There remains evidence of trapped lung on the right with very little aerated lung present and a persistent high did pneumothorax identified. An indwelling tunneled pleural drainage catheter extends into the posterior and inferior pleural space with a small amount of pleural fluid present currently.  There is a moderate left-sided pleural effusion demonstrating partial loculation and extending up into the major fissure. A moderate amount of ascites is also present in the left upper quadrant adjacent to the spleen.  Two separate left lower lobe lung nodules are identified measuring approximately 0.9 cm and 1.1 cm in diameter. No enlarged lymph nodes are seen. A porta cath is present.  Review of the MIP images confirms the above findings.  IMPRESSION: 1. Possible small pulmonary embolism identified in a left lower lobe  posterior basilar segmental branch. 2. Persistent "trapped" appearance of the right lung with lack of expansion despite previous  pleural drain placement. There is a large residual hydropneumothorax.   Electronically Signed   By: Aletta Edouard M.D.   On: 05/11/2013 15:35   Ct Abdomen Pelvis W Contrast  05/04/2013   CLINICAL DATA:  Abdominal pain. Fever. Chills. Undergoing chemotherapy for pancreatic carcinoma.  EXAM: CT ABDOMEN AND PELVIS WITH CONTRAST  TECHNIQUE: Multidetector CT imaging of the abdomen and pelvis was performed using the standard protocol following bolus administration of intravenous contrast.  CONTRAST:  162mL OMNIPAQUE IOHEXOL 300 MG/ML  SOLN  COMPARISON:  03/19/2013  FINDINGS: Right chest tube is now seen in place with a right basilar hydropneumothorax. Collapse or consolidation is seen in the right lower lung. New small to moderate left pleural effusion noted.  New moderate ascites is seen. Peritoneal enhancement is also noted, and omental caking is again demonstrated, consistent with peritoneal carcinomatosis. No evidence of bowel obstruction.  Hypovascular mass in the pancreatic body is again seen which currently measures 4.1 x 3.8 cm on image 69 this is not significant change since previous study.  Probable tiny sub-cm hepatic cysts are stable. No definite liver masses are identified. The spleen, and adrenal glands are normal in appearance. Tiny renal cyst remains stable and there is no evidence of renal masses or hydronephrosis. No lymphadenopathy identified no evidence focal inflammatory process or bowel obstruction. No suspicious bone lesions identified.  IMPRESSION: New ascites and findings consistent with increased peritoneal carcinomatosis.  Large right basilar hydropneumothorax with right chest tube in place.  Collapse or consolidation in right lower lung. Infection or neoplasm cannot be excluded.  New small to moderate left pleural effusion.  Stable 4 cm hypovascular mass the  pancreatic body, consistent with known primary pancreatic carcinoma.   Electronically Signed   By: Earle Gell M.D.   On: 05/04/2013 14:52   US Abdomen Limited  05/05/2013   CLINICAL DATA:  Patient with recently diagnosed pancreatic Jimmy with possible metastatic disease. Abdominal pain. Ascites noted on CT of the abdomen  EXAM: US ABDOMEN LIMITED - RIGHT UPPER QUADRANT  COMPARISON:  CT of the abdomen on 05/04/2013.  FINDINGS: Limited ultrasound of the abdomen findings small volume of ascites in the perihepatic and perisplenic regions. These areas are not amenable for percutaneous paracentesis. There is also a potential collection of fluid in the low pelvis note is very difficult to differentiate from fluid versus fluid-filled intestine. It was felt that this was not safe for percutaneous paracentesis.  IMPRESSION: Small volume perihepatic, perisplenic ascites not amenable for paracentesis. PROCEDURE not performed.  Read by: Ascencion Dike PA-C   Electronically Signed   By: Maryclare Bean M.D.   On: 05/05/2013 15:55   Dg Chest Port 1 View  05/14/2013   CLINICAL DATA:  Right pneumothorax.  EXAM: PORTABLE CHEST - 1 VIEW  COMPARISON:  05/13/2013 and 05/11/2013  FINDINGS: Power port in place. Right pneumothorax is unchanged. PleurX catheter present at the right lung base. Persistent chronic atelectasis at the right lung base.  No change in the left pleural effusion.  IMPRESSION: No change since the prior study. Persistent right pneumothorax and left pleural effusion.   Electronically Signed   By: Rozetta Nunnery M.D.   On: 05/14/2013 08:04   Dg Chest Port 1 View  05/13/2013   CLINICAL DATA:  Follow-up of pneumothorax  EXAM: PORTABLE CHEST - 1 VIEW  COMPARISON:  CT ANGIO CHEST W/CM &/OR WO/CM dated 05/11/2013; DG CHEST 2 VIEW dated 05/11/2013  FINDINGS: The patient has a known right-sided hydro pneumothorax. The  pneumothorax has slightly decreased in size since the previous study but still likely occupies the least 30-40%  of the lung volume. The small caliber right-sided chest tube tip is stable in position with the tip projected inferiorly in the medial aspect of the hemithorax. Density at the right lung base is consistent with known pleural fluid and there is consolidated lung parenchyma in the right infrahilar region which is stable.  The interstitial markings on the left have become more conspicuous since the earlier study. A portion of this may be related fluid in the major fissure. There is a persistent moderate size left pleural effusion. The cardiac silhouette is normal in size. The Port-A-Cath appliance tip lies in the region of the mid to distal SVC.  IMPRESSION: 1. The known large right hydropneumothorax has slightly decreased in size but still occupies at least 40-50% of the lung volume. The position of the right-sided chest tube is unchanged. 2. The pulmonary interstitial markings within the left lung are more conspicuous today. The left-sided pleural effusion is unchanged. 3. There is no evidence of CHF.   Electronically Signed   By: David  Martinique   On: 05/13/2013 19:08   Dg Chest Port 1 View  05/06/2013   CLINICAL DATA:  Pneumothorax.  EXAM: PORTABLE CHEST - 1 VIEW  COMPARISON:  05/05/2013.  FINDINGS: Port-A-Cath in stable anatomic position. Persistent prominent right pneumothorax. Patient's physician has previously been notified of this finding. Chest tube noted over right chest in stable position. Heart size is stable. Pulmonary infiltrate on the left has improved. Left-sided pleural effusion has increased in size. No acute osseous abnormality.  IMPRESSION: 1. Persistent right pneumothorax. Chest tube in stable position. Patient's physician has been previously notified of this finding. 2. Partial clearing of left pulmonary infiltrate. Developing left pleural effusion.   Electronically Signed   By: Marcello Moores  Register   On: 05/06/2013 07:26   Dg Chest Port 1 View  05/05/2013   CLINICAL DATA:  Followup pneumothorax.   EXAM: PORTABLE CHEST - 1 VIEW  COMPARISON:  05/04/2013  FINDINGS: Right IJ central venous catheter is unchanged. Small caliber right-sided chest tube has tip over the medial right base unchanged. There is continued evidence of a moderate to large right-sided pneumothorax which has worsened compared to the recent prior exam from 05/04/2013 at 1443 although is not significantly changed from the prior exam 05/04/2013 11:28 a.m. There is continued hazy opacification over the right lower thorax likely layering pleural fluid slightly improved. There is mild worsening left perihilar and basilar opacification which may be due to interstitial edema. There is continued moderate consolidation over the inferior aspect of the collapsed right lung unchanged. Cardiomediastinal silhouette and remainder of the exam is unchanged.  IMPRESSION: Interval worsening of patient's moderate to large right-sided pneumothorax with small caliber chest tube in place and unchanged with tip over the medial right base.  Slightly improved right pleural fluid collection. Stable consolidation over the inferior aspect of the collapse right lung. Worsening left perihilar/ basilar opacification suggesting interstitial edema.  These results were called by telephone at the time of interpretation on 05/05/2013 at 7:31 AM to Dr. Janece Canterbury , who verbally acknowledged these results.   Electronically Signed   By: Marin Olp M.D.   On: 05/05/2013 07:31   Dg Chest Port 1 View  05/04/2013   CLINICAL DATA:  Shortness of breath.  EXAM: PORTABLE CHEST - 1 VIEW  COMPARISON:  None.  FINDINGS: Right chest tube is noted of the right  lower chest. A right pneumothorax is present. The pneumothorax is prominent and may be under tension. Atelectasis and effusion on the right noted. Port-A-Cath noted in good anatomic position. Small left pleural effusion. Mild left base atelectasis. Heart size normal.  IMPRESSION: 1. Prominent right pneumothorax with with probable  tension. Right chest tube is noted over the right chest. 2. Bibasilar atelectasis with bilateral pleural effusions. Critical Value/emergent results were called by telephone at the time of interpretation on 05/04/2013 at 11:50 AM to Dr. Lyle Niblett Berkshire , who verbally acknowledged these results.   Electronically Signed   By: Marcello Moores  Register   On: 05/04/2013 11:50    Microbiology: Recent Results (from the past 240 hour(s))  CLOSTRIDIUM DIFFICILE BY PCR     Status: None   Collection Time    05/06/13 12:36 AM      Result Value Range Status   C difficile by pcr NEGATIVE  NEGATIVE Final   Comment: Performed at [facility]     Status: None   Collection Time    05/06/13 12:36 AM      Result Value Range Status   Specimen Description STOOL   Final   Special Requests Normal   Final   Culture     Final   Value: NO SALMONELLA, SHIGELLA, CAMPYLOBACTER, YERSINIA, OR E.COLI [id number] ISOLATED     Performed at [facility]   Report Status 05/09/2013 FINAL   Final  URINE CULTURE     Status: None   Collection Time    05/06/13 11:16 AM      Result Value Range Status   Specimen Description URINE, CLEAN CATCH   Final   Special Requests NONE   Final   Culture  Setup Time     Final   Value: 05/06/2013 13:47     Performed at [facility]     Final   Value: NO GROWTH     Performed at [facility]   Culture     Final   Value: NO GROWTH     Performed at [facility]   Report Status 05/07/2013 FINAL   Final     Labs: Basic Metabolic Panel:  Recent Labs Lab 05/11/13 1405 05/11/13 1900 05/12/13 0615 05/13/13 0425 05/14/13 0427  NA 129*  --  133* 131* 132*  K 4.1  --  3.7 3.7 4.0  CL 90*  --  93* 95* 95*  CO2 24  --  21 24 25   GLUCOSE 116*  --  84 100* 144*  BUN 14  --  13 10 9   CREATININE 0.53  --  0.50 0.47* 0.49*  CALCIUM 8.6  --  8.4 7.8* 7.8*  MG  --  1.6  --   --   --   PHOS  --  2.7  --   --   --    Liver  Function Tests:  Recent Labs Lab 05/11/13 1405 05/12/13 0615  AST 29 28  ALT 21 21  ALKPHOS 96 83  BILITOT 0.5 0.4  PROT 5.4* 5.2*  ALBUMIN 1.7* 1.6*   No results found for this basename: LIPASE, AMYLASE,  in the last 168 hours No results found for this basename: AMMONIA,  in the last 168 hours CBC:  Recent Labs Lab 05/11/13 1405 05/12/13 0615 05/13/13 0425 05/14/13 0427  WBC 19.6* 18.6* 17.5* 18.8*  NEUTROABS 12.7*  --   --   --   HGB 12.4*  11.6* 11.5* 11.8*  HCT 35.4* 33.9* 34.2* 35.9*  MCV 82.9 83.7 84.9 85.5  PLT 302 320 364 399   Cardiac Enzymes:  Recent Labs Lab 05/11/13 1405  TROPONINI <0.30   BNP: BNP (last 3 results)  Recent Labs  05/04/13 1135  PROBNP 542.0*   CBG:  Recent Labs Lab 05/14/13 1204 05/14/13 1612 05/14/13 2051 05/15/13 0804 05/15/13 1141  GLUCAP 87 109* 113* 73 96       Signed:  Evaluna Utke  [facility] 05/15/2013, 3:14 PM

## 2013-05-15 NOTE — Care Management Note (Unsigned)
    Page 1 of 1   05/15/2013     4:11:54 PM   CARE MANAGEMENT NOTE 05/15/2013  Patient:  Jimmy Christensen, Jimmy Christensen   Account Number:  [id number]  Date Initiated:  05/15/2013  Documentation initiated by:  [facility]  Subjective/Objective Assessment:   58 year old male with hx of met pancreatic cancer admitted with PE.     Action/Plan:   HHPT services.   Anticipated DC Date:  05/15/2013   Anticipated DC Plan:  [place]  CM consult      Choice offered to / List presented to:  C-1 Patient   DME arranged  [facility]      DME agency  [place] arranged  [place]   Status of service:  Completed, signed off Medicare Important Message given?  NA - LOS <3 / Initial given by admissions (If response is "NO", the following Medicare IM given date fields will be blank) Date Medicare IM given:   Date Additional Medicare IM given:    Discharge Disposition:  [place]  Per UR Regulation:  Reviewed for med. necessity/level of care/duration of stay  If discussed at [place] of Stay Meetings, dates discussed:    Comments:

## 2013-05-15 NOTE — Progress Notes (Addendum)
Pt's O2 sats on room air at rest are 90 to 91%.

## 2013-05-15 NOTE — Progress Notes (Addendum)
Patient ID: Jimmy Christensen, male   DOB: 06/06/1955, 57 y.o.   MRN: [id number]  [facility] PROGRESS NOTE  Kyron Schlitt WVP:[id number] DOB: [birth year] DOA: 05/11/2013 PCP: [name], MD  Brief narrative: 58 year old male with past medical history of pancreatic cancer (undergoing treatment at cancer center in [place] but was seen previously by Dr. [name]) and related malignant pleural effusion with right side pleur-x catheter placement 1 week prior to previous admission, recent admission (on 05/04/2013 for trapped lung due to malignant pleural effusion), hypertension, DM, pneumothorax presented to [facility] ED 05/11/2013 with complaints of worsening shortness of breath for past 24 hours prior to this admission.  Assessment and Plan:   Small Pulmonary embolus  - continue Lovenox per pharmacy protocol  -lovenox teaching ongoing -DC home on long term lovenox  Moderate to large R pneumothorax -appreciate Pulm consult -unfortunately underlying trapped lung and hence unchanged with suction/under water seal -pleurx was being drained every other day by wife -DC suction?, defer to pulm today and directions to drain pleurx   CAD (coronary artery disease)  - continue aspirin, statin  Pancreatic cancer with malignant pleural effusions and ascites  - wants to continue Chemo at [facility] [place], s/p 2 cycles -continue MS Contin increase dose to 45mg , cymbalta and dilaudid PRN -had palliative meeting last week, plan for aggressive scope of treatment  Diabetes mellitus type 2  - continue canaglifozin 100 mg PO daily  - continue lantus 10 units at bedtime, SSI - stable   Protein-calorie malnutrition, severe  - secondary metastatic pancreatic cancer  - nutrition consulted and recommendations appreciated  - continue ensure supplements   Anemia of chronic disease  - secondary to history of malignancy   Leukocytosis, unspecified  - likely from acute pulmonary embolism, afebrile  - defer  antibiotic treatment for now as no clear infectious etiology identified   Code Status: Full Family Communication: Pt and family at bedside Disposition Plan: Home tomorrow  Radiological Exams on Admission: Dg Chest 2 View  05/11/2013 IMPRESSION: 1. The right hydropneumothorax on the right is unchanged and likely occupies at least 50-60% of the lung volume. The right-sided small caliber pleural drainage tube is stable in position. 2. A small left pleural effusion is not significantly changed from the previous study. Electronically Signed By: David Martinique On: 05/11/2013 14:34  Ct Angio Chest Pe W/cm &/or Wo Cm  05/11/2013 IMPRESSION: 1. Possible small pulmonary embolism identified in a left lower lobe posterior basilar segmental branch. 2. Persistent "trapped" appearance of the right lung with lack of expansion despite previous pleural drain placement. There is a large residual hydropneumothorax. Electronically Signed By: Aletta Edouard M.D. On: 05/11/2013 15:35  Consultants:  None  Antibiotics:  Cipro 01/23 for SBP, started on last admission 01/23 and recommendation per GI is to complete for total 2 weeks    HPI/Subjective: Breathing better, intermittent pain  Objective: Filed Vitals:   05/14/13 1400 05/14/13 1900 05/14/13 2056 05/15/13 0500  BP: 102/67  103/62 101/66  Pulse: 94  104 92  Temp: 98 F (36.7 C)  98.2 F (36.8 C) 98.2 F (36.8 C)  TempSrc: Oral  Oral Oral  Resp:   18 16  Height:      Weight:  98.8 kg (217 lb 13 oz)  89.3 kg (196 lb 13.9 oz)  SpO2: 93%  93% 94%    Intake/Output Summary (Last 24 hours) at 05/15/13 0948 Last data filed at 05/15/13 0600  Gross per 24 hour  Intake  120 ml  Output   1850 ml  Net  -1730 ml    Exam:   General:  Thinly built male, no distress  Cardiovascular: Regular rhythm, tachycardic, S1/S2, no murmurs, no rubs, no gallops  Respiratory: diminished on R  Abdomen: Soft, tender in epigastric area, non distended, bowel sounds  present, no guarding  Extremities: No edema, pulses DP and PT palpable bilaterally  Neuro: Grossly nonfocal  Data Reviewed: Basic Metabolic Panel:  Recent Labs Lab 05/11/13 1405 05/11/13 1900 05/12/13 0615 05/13/13 0425 05/14/13 0427  NA 129*  --  133* 131* 132*  K 4.1  --  3.7 3.7 4.0  CL 90*  --  93* 95* 95*  CO2 24  --  21 24 25   GLUCOSE 116*  --  84 100* 144*  BUN 14  --  13 10 9   CREATININE 0.53  --  0.50 0.47* 0.49*  CALCIUM 8.6  --  8.4 7.8* 7.8*  MG  --  1.6  --   --   --   PHOS  --  2.7  --   --   --    Liver Function Tests:  Recent Labs Lab 05/11/13 1405 05/12/13 0615  AST 29 28  ALT 21 21  ALKPHOS 96 83  BILITOT 0.5 0.4  PROT 5.4* 5.2*  ALBUMIN 1.7* 1.6*   CBC:  Recent Labs Lab 05/11/13 1405 05/12/13 0615 05/13/13 0425 05/14/13 0427  WBC 19.6* 18.6* 17.5* 18.8*  NEUTROABS 12.7*  --   --   --   HGB 12.4* 11.6* 11.5* 11.8*  HCT 35.4* 33.9* 34.2* 35.9*  MCV 82.9 83.7 84.9 85.5  PLT 302 320 364 399   Cardiac Enzymes:  Recent Labs Lab 05/11/13 1405  TROPONINI <0.30   CBG:  Recent Labs Lab 05/14/13 0733 05/14/13 1204 05/14/13 1612 05/14/13 2051 05/15/13 0804  GLUCAP 133* 87 109* 113* 73    Recent Results (from the past 240 hour(s))  CLOSTRIDIUM DIFFICILE BY PCR     Status: None   Collection Time    05/06/13 12:36 AM      Result Value Range Status   C difficile by pcr NEGATIVE  NEGATIVE Final   Comment: Performed at [facility]     Status: None   Collection Time    05/06/13 12:36 AM      Result Value Range Status   Specimen Description STOOL   Final   Special Requests Normal   Final   Culture     Final   Value: NO SALMONELLA, SHIGELLA, CAMPYLOBACTER, YERSINIA, OR E.COLI 0157:H7 ISOLATED     Performed at [facility]   Report Status 05/09/2013 FINAL   Final  URINE CULTURE     Status: None   Collection Time    05/06/13 11:16 AM      Result Value Range Status   Specimen Description URINE,  CLEAN CATCH   Final   Special Requests NONE   Final   Culture  Setup Time     Final   Value: 05/06/2013 13:47     Performed at [facility]     Final   Value: NO GROWTH     Performed at [facility]   Culture     Final   Value: NO GROWTH     Performed at [facility]   Report Status 05/07/2013 FINAL   Final     Scheduled Meds: . aspirin EC  81 mg Oral Daily  . atorvastatin  40 mg Oral Daily  . Canagliflozin  1 tablet Oral Daily  . cholecalciferol  5,000 Units Oral Daily  . ciprofloxacin  500 mg Oral BID  . diclofenac  1 patch Transdermal BID  . dronabinol  5 mg Oral BID AC  . DULoxetine  60 mg Oral Daily  . enoxaparin (LOVENOX) injection  90 mg Subcutaneous Q12H  . feeding supplement (ENSURE COMPLETE)  237 mL Oral TID BM  . hydrocerin   Topical BID  . insulin aspart  0-9 Units Subcutaneous TID WC  . insulin glargine  10 Units Subcutaneous QHS  . lidocaine-prilocaine  1 application Topical Once  . metFORMIN  500 mg Oral Q breakfast  . metoprolol succinate  25 mg Oral Daily  . morphine  30 mg Oral Q12H  . niacin  1,000 mg Oral QHS  . ondansetron  4 mg Oral Q8H  . pantoprazole  40 mg Oral Daily  . protein supplement  1 scoop Oral TID WC  . sodium chloride  10-40 mL Intracatheter Q12H  . sodium chloride  3 mL Intravenous Q12H  . Zinc  10 mg Mouth/Throat Daily   Continuous Infusions:    Domenic Polite, MD  [facility] Pager [phone]  If 7PM-7AM, please contact night-coverage [email or web address] Password [email or web address] 05/15/2013, 9:48 AM   LOS: 4 days

## 2013-05-15 NOTE — Evaluation (Signed)
Physical Therapy One Time Evaluation Patient Details Name: Jimmy Christensen MRN: [id number] DOB: 02-10-1956 Today's Date: 05/15/2013 Time: 2376-2831 PT Time Calculation (min): 17 min  PT Assessment / Plan / Recommendation History of Present Illness  58 year old male with past medical history of pancreatic cancer (undergoing treatment at cancer center in [place] but was seen previously by Dr. [name]) and related malignant pleural effusion with right side pleur-x catheter placement 1 week prior to previous admission, recent admission (on 05/04/2013 for trapped lung due to malignant pleural effusion), hypertension, DM, pneumothorax presented to [facility] ED 05/11/2013 with complaints of worsening shortness of breath for past 24 hours prior to this admission.  Clinical Impression  Pt admitted with PE. Pt currently with functional limitations due to the deficits listed below (see PT Problem List).  Pt with recent admission and discharged home however had not received HHPT yet.  Per spouse, [facility] has been in contact and plans to see pt on Monday as he will likely d/c home today.  Pt became SOB during ambulation and required supplemental O2 to maintain saturations above 88% (see below).  Pt to f/u with HHPT.  Acute PT to sign off.  Thank you for referral.     PT Assessment  All further PT needs can be met in the next venue of care    Follow Up Recommendations  Home health PT    Does the patient have the potential to tolerate intense rehabilitation      Barriers to Discharge        Equipment Recommendations  Rolling walker with 5" wheels    Recommendations for Other Services     Frequency      Precautions / Restrictions Precautions Precautions: Fall Precaution Comments: monitor sats   Pertinent Vitals/Pain SATURATION QUALIFICATIONS: (This note is used to comply with regulatory documentation for home oxygen)  Patient Saturations on Room Air at Rest =  Not assessed (not on oxygen upon entering  room)  Patient Saturations on Room Air while Ambulating = 70%  Patient Saturations on 2 Liters of oxygen while Ambulating = 95%  Please briefly explain why patient needs home oxygen: to maintain SpO2 above 88% during ambulation.      Mobility  Bed Mobility Bed Mobility: Supine to Sit Supine to sit: Modified independent (Device/Increase time) Transfers Overall transfer level: Needs assistance Transfers: Sit to/from Stand Sit to Stand: Supervision Ambulation/Gait Ambulation/Gait assistance: Min guard Ambulation Distance (Feet): 160 Feet Assistive device: Rolling walker (2 wheeled) Gait Pattern/deviations: Step-through pattern Gait velocity: decr General Gait Details: initially unsteady so provided RW for hallway ambulation, SpO2 after 80 feet was 70% on room air so pt assisted to chair and pt placed on 2L O2  and returned to mid 90s prior to ambulating back to room, also reports 2/4 dyspnea    Exercises     PT Diagnosis: Difficulty walking  PT Problem List: Decreased mobility;Decreased activity tolerance;Decreased strength;Cardiopulmonary status limiting activity PT Treatment Interventions:       PT Goals(Current goals can be found in the care plan section) Acute Rehab PT Goals PT Goal Formulation: No goals set, d/c therapy  Visit Information  Last PT Received On: 05/15/13 Assistance Needed: +1 History of Present Illness: 58 year old male with past medical history of pancreatic cancer (undergoing treatment at cancer center in [place] but was seen previously by Dr. [name]) and related malignant pleural effusion with right side pleur-x catheter placement 1 week prior to previous admission, recent admission (on 05/04/2013 for  trapped lung due to malignant pleural effusion), hypertension, DM, pneumothorax presented to [facility] ED 05/11/2013 with complaints of worsening shortness of breath for past 24 hours prior to this admission.       [name]  expects to be discharged to:: Private residence Living Arrangements: Spouse/significant other Available Help at Discharge: Family Type of Home: House Home Access: Stairs to enter [employer] of Steps: 1 plus ledge Home Layout: Two level;Able to live on main level with bedroom/bathroom Home Equipment: Cane - single point Additional Comments: has been sleeping on couch:  only place he can get comfortable Prior Function Level of Independence: Independent Communication Communication: No difficulties    Cognition  Cognition Arousal/Alertness: Awake/alert Behavior During Therapy: WFL for tasks assessed/performed Overall Cognitive Status: Within Functional Limits for tasks assessed    Extremity/Trunk Assessment Lower Extremity Assessment Lower Extremity Assessment: Overall WFL for tasks assessed Cervical / Trunk Assessment Cervical / Trunk Assessment: Normal   Balance    End of Session PT - End of Session Equipment Utilized During Treatment: Oxygen Activity Tolerance: Patient limited by fatigue Patient left: in chair;with call bell/phone within reach;with family/visitor present  GP     Ryane Konieczny,KATHrine E 05/15/2013, 2:53 PM Carmelia Bake, PT, DPT 05/15/2013 Pager: [phone]

## 2013-05-15 NOTE — Progress Notes (Signed)
OT Screen Note:  Patient Details Name: Jimmy Christensen MRN: [id number] DOB: [birth year]   Cancelled Treatment:    Reason Eval/Treat Not Completed: Other (comment).  Spoke to pt.  I saw him on last admission to [facility]. Pt has help for adls as needed and he does not feel he needs any DME for bathroom at this time.  His activity tolerance is decreased and he verbalizes understanding of energy conservation:  Pacing and balancing activity to increase endurance.    Temple Ewart 05/15/2013, 4:32 PM Lesle Chris, OTR/L [phone] 05/15/2013

## 2013-05-28 ENCOUNTER — Encounter (HOSPITAL_COMMUNITY): Payer: Self-pay | Admitting: Emergency Medicine

## 2013-05-28 ENCOUNTER — Inpatient Hospital Stay (HOSPITAL_COMMUNITY)
Admission: EM | Admit: 2013-05-28 | Discharge: 2013-06-14 | DRG: 435 | Disposition: E | Payer: BC Managed Care – PPO | Attending: Internal Medicine | Admitting: Internal Medicine

## 2013-05-28 ENCOUNTER — Emergency Department (HOSPITAL_COMMUNITY): Payer: BC Managed Care – PPO

## 2013-05-28 DIAGNOSIS — R0602 Shortness of breath: Secondary | ICD-10-CM | POA: Diagnosis present

## 2013-05-28 DIAGNOSIS — R0781 Pleurodynia: Secondary | ICD-10-CM

## 2013-05-28 DIAGNOSIS — F411 Generalized anxiety disorder: Secondary | ICD-10-CM | POA: Diagnosis present

## 2013-05-28 DIAGNOSIS — J90 Pleural effusion, not elsewhere classified: Secondary | ICD-10-CM

## 2013-05-28 DIAGNOSIS — I252 Old myocardial infarction: Secondary | ICD-10-CM

## 2013-05-28 DIAGNOSIS — R197 Diarrhea, unspecified: Secondary | ICD-10-CM | POA: Diagnosis present

## 2013-05-28 DIAGNOSIS — R5383 Other fatigue: Secondary | ICD-10-CM

## 2013-05-28 DIAGNOSIS — D72829 Elevated white blood cell count, unspecified: Secondary | ICD-10-CM

## 2013-05-28 DIAGNOSIS — E43 Unspecified severe protein-calorie malnutrition: Secondary | ICD-10-CM

## 2013-05-28 DIAGNOSIS — K5909 Other constipation: Secondary | ICD-10-CM | POA: Diagnosis present

## 2013-05-28 DIAGNOSIS — Z515 Encounter for palliative care: Secondary | ICD-10-CM

## 2013-05-28 DIAGNOSIS — Z9861 Coronary angioplasty status: Secondary | ICD-10-CM

## 2013-05-28 DIAGNOSIS — M549 Dorsalgia, unspecified: Secondary | ICD-10-CM | POA: Diagnosis present

## 2013-05-28 DIAGNOSIS — Z9981 Dependence on supplemental oxygen: Secondary | ICD-10-CM

## 2013-05-28 DIAGNOSIS — E785 Hyperlipidemia, unspecified: Secondary | ICD-10-CM

## 2013-05-28 DIAGNOSIS — Z66 Do not resuscitate: Secondary | ICD-10-CM | POA: Diagnosis present

## 2013-05-28 DIAGNOSIS — D638 Anemia in other chronic diseases classified elsewhere: Secondary | ICD-10-CM

## 2013-05-28 DIAGNOSIS — G253 Myoclonus: Secondary | ICD-10-CM | POA: Diagnosis present

## 2013-05-28 DIAGNOSIS — I2510 Atherosclerotic heart disease of native coronary artery without angina pectoris: Secondary | ICD-10-CM

## 2013-05-28 DIAGNOSIS — J9620 Acute and chronic respiratory failure, unspecified whether with hypoxia or hypercapnia: Secondary | ICD-10-CM

## 2013-05-28 DIAGNOSIS — Z87891 Personal history of nicotine dependence: Secondary | ICD-10-CM

## 2013-05-28 DIAGNOSIS — R531 Weakness: Secondary | ICD-10-CM

## 2013-05-28 DIAGNOSIS — E1169 Type 2 diabetes mellitus with other specified complication: Secondary | ICD-10-CM | POA: Diagnosis present

## 2013-05-28 DIAGNOSIS — K439 Ventral hernia without obstruction or gangrene: Secondary | ICD-10-CM

## 2013-05-28 DIAGNOSIS — Z794 Long term (current) use of insulin: Secondary | ICD-10-CM

## 2013-05-28 DIAGNOSIS — T4275XA Adverse effect of unspecified antiepileptic and sedative-hypnotic drugs, initial encounter: Secondary | ICD-10-CM | POA: Diagnosis present

## 2013-05-28 DIAGNOSIS — Z7982 Long term (current) use of aspirin: Secondary | ICD-10-CM

## 2013-05-28 DIAGNOSIS — E871 Hypo-osmolality and hyponatremia: Secondary | ICD-10-CM

## 2013-05-28 DIAGNOSIS — J939 Pneumothorax, unspecified: Secondary | ICD-10-CM

## 2013-05-28 DIAGNOSIS — C259 Malignant neoplasm of pancreas, unspecified: Principal | ICD-10-CM

## 2013-05-28 DIAGNOSIS — J910 Malignant pleural effusion: Secondary | ICD-10-CM

## 2013-05-28 DIAGNOSIS — I2699 Other pulmonary embolism without acute cor pulmonale: Secondary | ICD-10-CM

## 2013-05-28 DIAGNOSIS — R0609 Other forms of dyspnea: Secondary | ICD-10-CM | POA: Diagnosis present

## 2013-05-28 DIAGNOSIS — Z800 Family history of malignant neoplasm of digestive organs: Secondary | ICD-10-CM

## 2013-05-28 DIAGNOSIS — C782 Secondary malignant neoplasm of pleura: Secondary | ICD-10-CM | POA: Diagnosis present

## 2013-05-28 DIAGNOSIS — R5381 Other malaise: Secondary | ICD-10-CM | POA: Diagnosis present

## 2013-05-28 DIAGNOSIS — E119 Type 2 diabetes mellitus without complications: Secondary | ICD-10-CM | POA: Diagnosis present

## 2013-05-28 DIAGNOSIS — J9383 Other pneumothorax: Secondary | ICD-10-CM

## 2013-05-28 DIAGNOSIS — Z87442 Personal history of urinary calculi: Secondary | ICD-10-CM

## 2013-05-28 DIAGNOSIS — Z8249 Family history of ischemic heart disease and other diseases of the circulatory system: Secondary | ICD-10-CM

## 2013-05-28 DIAGNOSIS — R0989 Other specified symptoms and signs involving the circulatory and respiratory systems: Secondary | ICD-10-CM | POA: Diagnosis present

## 2013-05-28 DIAGNOSIS — J9819 Other pulmonary collapse: Secondary | ICD-10-CM | POA: Diagnosis present

## 2013-05-28 DIAGNOSIS — IMO0002 Reserved for concepts with insufficient information to code with codable children: Secondary | ICD-10-CM

## 2013-05-28 DIAGNOSIS — R188 Other ascites: Secondary | ICD-10-CM | POA: Diagnosis present

## 2013-05-28 DIAGNOSIS — G893 Neoplasm related pain (acute) (chronic): Secondary | ICD-10-CM | POA: Diagnosis present

## 2013-05-28 DIAGNOSIS — Z823 Family history of stroke: Secondary | ICD-10-CM

## 2013-05-28 LAB — CBC WITH DIFFERENTIAL/PLATELET
BASOS ABS: 0 10*3/uL (ref 0.0–0.1)
Basophils Relative: 0 % (ref 0–1)
EOS ABS: 0 10*3/uL (ref 0.0–0.7)
Eosinophils Relative: 0 % (ref 0–5)
HCT: 29.8 % — ABNORMAL LOW (ref 39.0–52.0)
Hemoglobin: 9.7 g/dL — ABNORMAL LOW (ref 13.0–17.0)
Lymphocytes Relative: 7 % — ABNORMAL LOW (ref 12–46)
Lymphs Abs: 1.5 10*3/uL (ref 0.7–4.0)
MCH: 27.6 pg (ref 26.0–34.0)
MCHC: 32.6 g/dL (ref 30.0–36.0)
MCV: 84.7 fL (ref 78.0–100.0)
MONO ABS: 1.3 10*3/uL — AB (ref 0.1–1.0)
MONOS PCT: 6 % (ref 3–12)
NEUTROS ABS: 19 10*3/uL — AB (ref 1.7–7.7)
Neutrophils Relative %: 87 % — ABNORMAL HIGH (ref 43–77)
PLATELETS: 355 10*3/uL (ref 150–400)
RBC: 3.52 MIL/uL — ABNORMAL LOW (ref 4.22–5.81)
RDW: 17.1 % — AB (ref 11.5–15.5)
WBC: 21.8 10*3/uL — AB (ref 4.0–10.5)

## 2013-05-28 LAB — COMPREHENSIVE METABOLIC PANEL
ALK PHOS: 97 U/L (ref 39–117)
ALT: 33 U/L (ref 0–53)
AST: 34 U/L (ref 0–37)
Albumin: 1.9 g/dL — ABNORMAL LOW (ref 3.5–5.2)
BUN: 11 mg/dL (ref 6–23)
CO2: 27 meq/L (ref 19–32)
CREATININE: 0.34 mg/dL — AB (ref 0.50–1.35)
Calcium: 8.2 mg/dL — ABNORMAL LOW (ref 8.4–10.5)
Chloride: 94 mEq/L — ABNORMAL LOW (ref 96–112)
GFR calc Af Amer: >90 mL/min (ref >90)
Glucose, Bld: 113 mg/dL — ABNORMAL HIGH (ref 70–99)
POTASSIUM: 3.9 meq/L (ref 3.7–5.3)
Sodium: 133 mEq/L — ABNORMAL LOW (ref 137–147)
Total Bilirubin: 0.3 mg/dL (ref 0.3–1.2)
Total Protein: 5.6 g/dL — ABNORMAL LOW (ref 6.0–8.3)

## 2013-05-28 LAB — PROTIME-INR
INR: 1.09 (ref 0.00–1.49)
Prothrombin Time: 13.9 seconds (ref 11.6–15.2)

## 2013-05-28 LAB — LIPASE, BLOOD: Lipase: 7 U/L — ABNORMAL LOW (ref 11–59)

## 2013-05-28 MED ORDER — HYDROMORPHONE HCL PF 1 MG/ML IJ SOLN
1.0000 mg | Freq: Once | INTRAMUSCULAR | Status: AC
  Administered 2013-05-28: 1 mg via INTRAVENOUS
  Filled 2013-05-28: qty 1

## 2013-05-28 MED ORDER — SODIUM CHLORIDE 0.9 % IV SOLN: INTRAVENOUS | Status: DC

## 2013-05-28 MED ORDER — ONDANSETRON HCL 4 MG/2ML IJ SOLN: 4.0000 mg | Freq: Three times a day (TID) | INTRAMUSCULAR | Status: AC | PRN

## 2013-05-28 MED ORDER — HYDROMORPHONE HCL PF 1 MG/ML IJ SOLN
1.0000 mg | INTRAMUSCULAR | Status: AC | PRN
  Administered 2013-05-29: 1 mg via INTRAVENOUS
  Filled 2013-05-28: qty 1

## 2013-05-28 MED ORDER — ONDANSETRON HCL 4 MG/2ML IJ SOLN
4.0000 mg | Freq: Once | INTRAMUSCULAR | Status: AC
  Administered 2013-05-28: 4 mg via INTRAVENOUS
  Filled 2013-05-28: qty 2

## 2013-05-28 MED ORDER — SODIUM CHLORIDE 0.9 % IV SOLN
INTRAVENOUS | Status: DC
  Administered 2013-05-28: 100 mL/h via INTRAVENOUS
  Administered 2013-05-29 – 2013-06-02 (×6): via INTRAVENOUS
  Administered 2013-06-02: 20 mL/h via INTRAVENOUS

## 2013-05-28 MED ORDER — SODIUM CHLORIDE 0.9 % IJ SOLN
INTRAMUSCULAR | Status: AC
  Administered 2013-05-28: 20:00:00
  Filled 2013-05-28: qty 10

## 2013-05-28 NOTE — ED Notes (Signed)
MD at bedside. 

## 2013-05-28 NOTE — ED Notes (Addendum)
Per EMS pt comes from home where his wife was draining fluid off right lung and pt starting right flank pain, which was worse than usually. The fluid is normally pinkish but today was darker red in color, the amount drained off today was per his normal. This is drained daily by pt's wife, last chemo treatment was 2 1/2 weeks ago.  Pt normally on 3L O2 via [place] at home.

## 2013-05-28 NOTE — Progress Notes (Signed)
   CARE MANAGEMENT ED NOTE 06/09/2013  Patient:  Jimmy Christensen, Jimmy Christensen   Account Number:  [id number]  Date Initiated:  05/25/2013  Documentation initiated by:  Livia Snellen  Subjective/Objective Assessment:   Patient presents to Ed with increasing size in pleural effussion with bloody drainage from chest tube.     Subjective/Objective Assessment Detail:   Patient with pmhx of pancreatic cancer.     Action/Plan:   Action/Plan Detail:   Anticipated DC Date:       Status Recommendation to Physician:   Result of Recommendation:    Other ED Services  Consult Working [place]  Other    Choice offered to / List presented to:            Status of service:  Completed, signed off  ED Comments:   ED Comments Detail:  EDCM spoke to patient and his wife at bedside.  Patient's wife is Lelend Heinecke, (c) [phone].  Patient lives with his wife.  Patient reports he has a visiting RN, PT and oxygen from [place] at home.  Patient also has a wheelchair and bedside commode and hospital bed at home.  Patient needs assistance with ADL's, currently his wife assists him.  Patient currently wears 3 liters of oxygen at home.  Patient and patient's family interested in Palliative care at home as they are having a difficult time controlling the patient's pain.  EDCM provided patient and his wife with a list of all the home hospice agencies. Patient's wife reports the visiting RN from [place] was going to call the [facility] [place] today but the patient came to the hospital. Patient and patient's family thankful for services.  No further EDCM needs at this time.

## 2013-05-28 NOTE — ED Notes (Signed)
Bed: AQ76 Expected date:  Expected time:  Means of arrival:  Comments: ems- 58 yo M, chest tube drainage abnormal

## 2013-05-28 NOTE — ED Provider Notes (Signed)
CSN: [id number]     Arrival date & time 05/22/2013  1808 History   First MD Initiated Contact with Patient 06/02/2013 1812     Chief Complaint  Patient presents with  . Flank Pain    right     (Consider location/radiation/quality/duration/timing/severity/associated sxs/prior Treatment) The history is provided by the patient, the spouse and the EMS personnel.   58 year old male history of pancreatic cancer. Undergoing chemotherapy treatments in the lab in [place] the patient is now getting too sick to travel. He had 2 sessions of chemotherapy they're working on his third session. Or group of chemotherapy. Patient's been having trouble with increasing right-sided pleural effusion. Catheter had been placed. Patient's wife had been draining the fluid. They were seen by pulmonary medicine at [facility] on Monday the pleural effusion has been increasing more rapidly so they were extracted to start draining the fluid once a day. Today when she was draining the fluid the fluid was noted to be more bloody and patient had severe pain in the right lateral chest right part of the back part of the chest and anterior chest. Patient is normally on 3 L of oxygen his room air sats are 95%. No change in that. No other specific new symptoms. Patient recently made arrangements to follow with heme on care at [facility] but has not been seen by them yet.  Past Medical History  Diagnosis Date  . Overweight   . Dyslipidemia   . CAD (coronary artery disease)     [name] (DES) stent 2006; Nuclear stress 05/08, EF 62%, no ischemia  . Ejection fraction     50% catheterization 2006 /  60% nuclear, 2008  . MI (myocardial infarction)   . Kidney stones 1981  . Carotid artery disease     Doppler, May, 2013, 0-39% bilateral  . Ventral hernia     First noted may, 2014  . Pancreatic cancer   . Constipation   . Chemotherapy-induced nausea   . Pleural effusion on right   . Diabetes mellitus type 2 in nonobese   . Cataract     Past Surgical History  Procedure Laterality Date  . Cardiac catheterization  2006    EF 50%; 62% nuclear 2008  . Kidney stone surgery    . Ankle surgery  right  . Coronary angioplasty    . Insertion / placement pleural catheter     Family History  Problem Relation Age of Onset  . Liver cancer Sister   . Cancer Paternal Grandmother   . Heart disease Mother   . Heart disease Father   . Stroke Sister   . Stroke Paternal Grandfather    History  Substance Use Topics  . Smoking status: Former Smoker -- 1.00 packs/day for 25 years    Types: Cigarettes  . Smokeless tobacco: Never Used  . Alcohol Use: Yes     Comment: rare    Review of Systems  Constitutional: Negative for fever.  HENT: Negative for congestion.   Eyes: Negative for redness.  Respiratory: Positive for shortness of breath.   Cardiovascular: Positive for chest pain.  Gastrointestinal: Positive for abdominal pain.  Genitourinary: Positive for flank pain.  Musculoskeletal: Positive for back pain.  Skin: Negative for rash.  Neurological: Negative for headaches.  Hematological: Does not bruise/bleed easily.  Psychiatric/Behavioral: Negative for confusion.      Allergies  Review of patient's allergies indicates no known allergies.  Home Medications   Current Outpatient Rx  Name  Route  Sig  Dispense  Refill  . albuterol (PROVENTIL HFA;VENTOLIN HFA) 108 (90 BASE) MCG/ACT inhaler   Inhalation   Inhale 1-2 puffs into the lungs every 6 (six) hours as needed for wheezing or shortness of breath.         Marland Kitchen aspirin 81 MG EC tablet   Oral   Take 81 mg by mouth daily.           Marland Kitchen atorvastatin (LIPITOR) 40 MG tablet   Oral   Take 1 tablet (40 mg total) by mouth daily.   90 tablet   3   . Cholecalciferol (VITAMIN D-3 PO)   Oral   Take 1 capsule by mouth daily.         Marland Kitchen dronabinol (MARINOL) 5 MG capsule   Oral   Take 1 capsule (5 mg total) by mouth 2 (two) times daily before lunch and supper.   30  capsule   0   . DULoxetine (CYMBALTA) 60 MG capsule   Oral   Take 60 mg by mouth daily.         Marland Kitchen enoxaparin (LOVENOX) 100 MG/ML injection   Subcutaneous   Inject 0.9 mLs (90 mg total) into the skin every 12 (twelve) hours.   60 Syringe   0   . feeding supplement, ENSURE COMPLETE, (ENSURE COMPLETE) LIQD   Oral   Take 237 mLs by mouth 3 (three) times daily between meals.         . fluticasone (FLONASE) 50 MCG/ACT nasal spray   Each Nare   Place 1 spray into both nostrils daily as needed.          . fluticasone (FLOVENT HFA) 110 MCG/ACT inhaler   Inhalation   Inhale 2 puffs into the lungs 2 (two) times daily.         . hydrocortisone (ANUSOL-HC) 2.5 % rectal cream   Rectal   Place rectally 2 (two) times daily as needed for hemorrhoids or itching.   30 g   0   . HYDROmorphone (DILAUDID) 2 MG tablet   Oral   Take 2 mg by mouth every 6 (six) hours as needed for moderate pain or severe pain.          Marland Kitchen insulin glargine (LANTUS) 100 UNIT/ML injection   Subcutaneous   Inject 10 Units into the skin at bedtime.          Marland Kitchen lactulose (CHRONULAC) 10 GM/15ML solution   Oral   Take 15 mLs (10 g total) by mouth 2 (two) times daily as needed for mild constipation or moderate constipation.   240 mL   0   . lidocaine-prilocaine (EMLA) cream   Topical   Apply 1 application topically once.         . meloxicam (MOBIC) 7.5 MG tablet   Oral   Take 7.5 mg by mouth daily.         . metFORMIN (GLUCOPHAGE) 500 MG tablet   Oral   Take 1 tablet by mouth daily.         . metoprolol succinate (TOPROL-XL) 25 MG 24 hr tablet   Oral   Take 1 tablet (25 mg total) by mouth daily.   90 tablet   3   . morphine (MS CONTIN) 15 MG 12 hr tablet   Oral   Take 3 tablets (45 mg total) by mouth every 12 (twelve) hours.   120 tablet   0   . niacin (NIASPAN) 1000 MG CR tablet  Oral   Take 1 tablet (1,000 mg total) by mouth at bedtime.   90 tablet   3   . nitroGLYCERIN  (NITROSTAT) 0.4 MG SL tablet   Sublingual   Place 1 tablet (0.4 mg total) under the tongue every 5 (five) minutes as needed. May repeat up to 3 doses.   25 tablet   11   . ondansetron (ZOFRAN) 4 MG tablet   Oral   Take 4 mg by mouth every 8 (eight) hours as needed for nausea or vomiting.         . pantoprazole (PROTONIX) 40 MG tablet   Oral   Take 40 mg by mouth 2 (two) times daily.         Marland Kitchen PRESCRIPTION MEDICATION      Folfirinox inj 04/28/13 chemo         . prochlorperazine (COMPAZINE) 5 MG tablet   Oral   Take 5 mg by mouth every 6 (six) hours as needed for nausea or vomiting.         . protein supplement (RESOURCE BENEPROTEIN) 6 g POWD   Oral   Take 1 scoop (6 g total) by mouth 3 (three) times daily with meals.   30 Can   0   . ramipril (ALTACE) 2.5 MG capsule   Oral   Take 2.5 mg by mouth daily.         . sodium chloride (OCEAN) 0.65 % SOLN nasal spray   Each Nare   Place 2 sprays into both nostrils at bedtime as needed for congestion.         . Zinc 10 MG LOZG   Mouth/Throat   Use as directed 10 mg in the mouth or throat daily.          BP 115/69  Pulse 113  Temp(Src) 98.2 F (36.8 C) (Oral)  Resp 20  SpO2 95% Physical Exam  Nursing note and vitals reviewed. Constitutional: He is oriented to person, place, and time. He appears well-developed and well-nourished. He appears distressed.  HENT:  Head: Normocephalic and atraumatic.  Eyes: Conjunctivae and EOM are normal. Pupils are equal, round, and reactive to light.  Neck: Normal range of motion. Neck supple.  Cardiovascular: Normal rate, regular rhythm and normal heart sounds.   No murmur heard. Pulmonary/Chest: Effort normal and breath sounds normal. No respiratory distress.  Abdominal: Soft. Bowel sounds are normal. There is no tenderness.  Musculoskeletal: Normal range of motion. He exhibits no edema.  Neurological: He is alert and oriented to person, place, and time. No cranial nerve  deficit. He exhibits normal muscle tone. Coordination normal.  Skin: Skin is warm.    ED Course  Procedures (including critical care time) Labs Review Labs Reviewed  COMPREHENSIVE METABOLIC PANEL - Abnormal; Notable for the following:    Sodium 133 (*)    Chloride 94 (*)    Glucose, Bld 113 (*)    Creatinine, Ser 0.34 (*)    Calcium 8.2 (*)    Total Protein 5.6 (*)    Albumin 1.9 (*)    All other components within normal limits  LIPASE, BLOOD - Abnormal; Notable for the following:    Lipase 7 (*)    All other components within normal limits  CBC WITH DIFFERENTIAL - Abnormal; Notable for the following:    WBC 21.8 (*)    RBC 3.52 (*)    Hemoglobin 9.7 (*)    HCT 29.8 (*)    RDW 17.1 (*)  All other components within normal limits  PROTIME-INR   Results for orders placed during the hospital encounter of 05/18/2013  COMPREHENSIVE METABOLIC PANEL      Result Value Ref Range   Sodium 133 (*) 137 - 147 mEq/L   Potassium 3.9  3.7 - 5.3 mEq/L   Chloride 94 (*) 96 - 112 mEq/L   CO2 27  19 - 32 mEq/L   Glucose, Bld 113 (*) 70 - 99 mg/dL   BUN 11  6 - 23 mg/dL   Creatinine, Ser 0.34 (*) 0.50 - 1.35 mg/dL   Calcium 8.2 (*) 8.4 - 10.5 mg/dL   Total Protein 5.6 (*) 6.0 - 8.3 g/dL   Albumin 1.9 (*) 3.5 - 5.2 g/dL   AST 34  0 - 37 U/L   ALT 33  0 - 53 U/L   Alkaline Phosphatase 97  39 - 117 U/L   Total Bilirubin 0.3  0.3 - 1.2 mg/dL   GFR calc non Af Amer >90  >90 mL/min   GFR calc Af Amer >90  >90 mL/min  LIPASE, BLOOD      Result Value Ref Range   Lipase 7 (*) 11 - 59 U/L  CBC WITH DIFFERENTIAL      Result Value Ref Range   WBC 21.8 (*) 4.0 - 10.5 K/uL   RBC 3.52 (*) 4.22 - 5.81 MIL/uL   Hemoglobin 9.7 (*) 13.0 - 17.0 g/dL   HCT 29.8 (*) 39.0 - 52.0 %   MCV 84.7  78.0 - 100.0 fL   MCH 27.6  26.0 - 34.0 pg   MCHC 32.6  30.0 - 36.0 g/dL   RDW 17.1 (*) 11.5 - 15.5 %   Platelets 355  150 - 400 K/uL   Neutrophils Relative % PENDING  43 - 77 %   Neutro Abs PENDING  1.7 - 7.7  K/uL   Band Neutrophils PENDING  0 - 10 %   Lymphocytes Relative PENDING  12 - 46 %   Lymphs Abs PENDING  0.7 - 4.0 K/uL   Monocytes Relative PENDING  3 - 12 %   Monocytes Absolute PENDING  0.1 - 1.0 K/uL   Eosinophils Relative PENDING  0 - 5 %   Eosinophils Absolute PENDING  0.0 - 0.7 K/uL   Basophils Relative PENDING  0 - 1 %   Basophils Absolute PENDING  0.0 - 0.1 K/uL   WBC Morphology PENDING     RBC Morphology PENDING     Smear Review PENDING     nRBC PENDING  0 /100 WBC   Metamyelocytes Relative PENDING     Myelocytes PENDING     Promyelocytes Absolute PENDING     Blasts PENDING    PROTIME-INR      Result Value Ref Range   Prothrombin Time 13.9  11.6 - 15.2 seconds   INR 1.09  0.00 - 1.49    Imaging Review Dg Chest 2 View  06/12/2013   CLINICAL DATA:  Increasing shortness of Breath  EXAM: CHEST  2 VIEW  COMPARISON:  05/15/2013  FINDINGS: Right-sided chest wall port and right PleurX catheter are again identified. There remains partial collapse of the right lung but stable from the prior exam. The pleural fluid has increased slightly on the right hand side. A left pleural effusion is noted which is increased in the interval from the prior study. Patchy interstitial changes are again noted within the left lung.  IMPRESSION: Increased bilateral pleural effusions.  Stable partial collapse  of the right lung.   Electronically Signed   By: Inez Catalina M.D.   On: Jun 02, 2013 19:24    EKG Interpretation    Date/Time:  June 02, 2013 19:08:31 EST Ventricular Rate:  108 PR Interval:  119 QRS Duration: 76 QT Interval:  314 QTC Calculation: 421 R Axis:   90 Text Interpretation:  Sinus tachycardia Ventricular premature complex Borderline right axis deviation Borderline T abnormalities, inferior leads No significant change since last tracing Confirmed by Montell Leopard  MD, Aamiyah Derrick ([id number]) on 2013/06/02 7:15:01 PM            MDM   Final diagnoses:  Pancreatic cancer   Pleural effusion   Patient with pancreatic cancer on third round of chemotherapy. Treatments have been done down in [place] in [place]. Patient doesn't have hematology oncology here yet but is in the process of getting plugged in with the [facility] cancer clinic. Patient with increasing pleural effusions. And pain on the left side when the fluid is drained. Also fluid is getting more bloody. Patient's lab workup without significant abnormalities. The patient is would prefer admission to monitor the pleural effusion get it down as been increasing over the past several days. Patient recently got plugged in the pulmonology at [facility]. We'll discuss with hospitalist and arrange admission. Patient normally on 3 L of oxygen he is on that here sats are good. Patient is having increased pain no with like to have better pain control and to make sure that the pleural effusion gets under better control and that he's not had severe pain when the fluid is drawn off.     Mervin Kung, MD Jun 02, 2013 [phone]

## 2013-05-29 ENCOUNTER — Inpatient Hospital Stay (HOSPITAL_COMMUNITY): Payer: BC Managed Care – PPO

## 2013-05-29 DIAGNOSIS — M7989 Other specified soft tissue disorders: Secondary | ICD-10-CM

## 2013-05-29 DIAGNOSIS — R071 Chest pain on breathing: Secondary | ICD-10-CM

## 2013-05-29 DIAGNOSIS — R0781 Pleurodynia: Secondary | ICD-10-CM | POA: Diagnosis present

## 2013-05-29 DIAGNOSIS — E119 Type 2 diabetes mellitus without complications: Secondary | ICD-10-CM

## 2013-05-29 DIAGNOSIS — J90 Pleural effusion, not elsewhere classified: Secondary | ICD-10-CM | POA: Diagnosis present

## 2013-05-29 DIAGNOSIS — C259 Malignant neoplasm of pancreas, unspecified: Principal | ICD-10-CM

## 2013-05-29 DIAGNOSIS — J910 Malignant pleural effusion: Secondary | ICD-10-CM | POA: Diagnosis present

## 2013-05-29 DIAGNOSIS — E43 Unspecified severe protein-calorie malnutrition: Secondary | ICD-10-CM

## 2013-05-29 DIAGNOSIS — R531 Weakness: Secondary | ICD-10-CM | POA: Diagnosis present

## 2013-05-29 LAB — CBC
HCT: 31 % — ABNORMAL LOW (ref 39.0–52.0)
HEMOGLOBIN: 10.1 g/dL — AB (ref 13.0–17.0)
MCH: 28.2 pg (ref 26.0–34.0)
MCHC: 32.6 g/dL (ref 30.0–36.0)
MCV: 86.6 fL (ref 78.0–100.0)
Platelets: 321 10*3/uL (ref 150–400)
RBC: 3.58 MIL/uL — ABNORMAL LOW (ref 4.22–5.81)
RDW: 17.2 % — ABNORMAL HIGH (ref 11.5–15.5)
WBC: 17.8 10*3/uL — ABNORMAL HIGH (ref 4.0–10.5)

## 2013-05-29 LAB — GLUCOSE, CAPILLARY
GLUCOSE-CAPILLARY: 186 mg/dL — AB (ref 70–99)
Glucose-Capillary: 68 mg/dL — ABNORMAL LOW (ref 70–99)
Glucose-Capillary: 70 mg/dL (ref 70–99)
Glucose-Capillary: 126 mg/dL — ABNORMAL HIGH (ref 70–99)
Glucose-Capillary: 161 mg/dL — ABNORMAL HIGH (ref 70–99)

## 2013-05-29 LAB — BASIC METABOLIC PANEL
BUN: 10 mg/dL (ref 6–23)
CO2: 29 meq/L (ref 19–32)
Calcium: 8.2 mg/dL — ABNORMAL LOW (ref 8.4–10.5)
Chloride: 98 mEq/L (ref 96–112)
Creatinine, Ser: 0.43 mg/dL — ABNORMAL LOW (ref 0.50–1.35)
GFR calc Af Amer: >90 mL/min (ref >90)
GFR calc non Af Amer: >90 mL/min (ref >90)
Glucose, Bld: 146 mg/dL — ABNORMAL HIGH (ref 70–99)
POTASSIUM: 4.1 meq/L (ref 3.7–5.3)
SODIUM: 136 meq/L — AB (ref 137–147)

## 2013-05-29 LAB — PREALBUMIN

## 2013-05-29 MED ORDER — ENOXAPARIN SODIUM 100 MG/ML ~~LOC~~ SOLN
90.0000 mg | Freq: Two times a day (BID) | SUBCUTANEOUS | Status: DC
  Administered 2013-05-29 – 2013-05-31 (×4): 90 mg via SUBCUTANEOUS
  Filled 2013-05-29 (×5): qty 1

## 2013-05-29 MED ORDER — FLUTICASONE PROPIONATE 50 MCG/ACT NA SUSP
1.0000 | Freq: Every day | NASAL | Status: DC | PRN
  Filled 2013-05-29: qty 16

## 2013-05-29 MED ORDER — DULOXETINE HCL 60 MG PO CPEP
60.0000 mg | ORAL_CAPSULE | Freq: Every day | ORAL | Status: DC
  Administered 2013-05-29 – 2013-06-02 (×5): 60 mg via ORAL
  Filled 2013-05-29 (×5): qty 1

## 2013-05-29 MED ORDER — RESOURCE INSTANT PROTEIN PO PWD PACKET
1.0000 | Freq: Three times a day (TID) | ORAL | Status: DC
  Administered 2013-05-29 – 2013-06-02 (×11): 6 g via ORAL
  Filled 2013-05-29 (×16): qty 6

## 2013-05-29 MED ORDER — HYDROMORPHONE HCL 2 MG PO TABS: 1.0000 mg | ORAL_TABLET | ORAL | Status: DC | PRN

## 2013-05-29 MED ORDER — METOPROLOL SUCCINATE ER 25 MG PO TB24
25.0000 mg | ORAL_TABLET | Freq: Every day | ORAL | Status: DC
  Administered 2013-05-29 – 2013-06-02 (×5): 25 mg via ORAL
  Filled 2013-05-29 (×5): qty 1

## 2013-05-29 MED ORDER — BISACODYL 10 MG RE SUPP
10.0000 mg | Freq: Once | RECTAL | Status: AC
  Administered 2013-05-29: 10 mg via RECTAL
  Filled 2013-05-29: qty 1

## 2013-05-29 MED ORDER — HYDROMORPHONE HCL PF 1 MG/ML IJ SOLN
1.0000 mg | INTRAMUSCULAR | Status: DC | PRN
  Administered 2013-05-29 (×3): 2 mg via INTRAVENOUS
  Administered 2013-05-31: 1 mg via INTRAVENOUS
  Filled 2013-05-29: qty 1
  Filled 2013-05-29 (×4): qty 2

## 2013-05-29 MED ORDER — SALINE SPRAY 0.65 % NA SOLN
2.0000 | Freq: Every evening | NASAL | Status: DC | PRN
  Filled 2013-05-29: qty 44

## 2013-05-29 MED ORDER — HYDROMORPHONE HCL 2 MG PO TABS: 2.0000 mg | ORAL_TABLET | ORAL | Status: DC | PRN

## 2013-05-29 MED ORDER — LACTULOSE 10 GM/15ML PO SOLN
10.0000 g | Freq: Two times a day (BID) | ORAL | Status: DC | PRN
  Filled 2013-05-29: qty 15

## 2013-05-29 MED ORDER — VITAMIN D 1000 UNITS PO TABS
3000.0000 [IU] | ORAL_TABLET | Freq: Every day | ORAL | Status: DC
  Administered 2013-05-29 – 2013-06-01 (×4): 3000 [IU] via ORAL
  Filled 2013-05-29 (×5): qty 3

## 2013-05-29 MED ORDER — ONDANSETRON HCL 4 MG PO TABS: 4.0000 mg | ORAL_TABLET | Freq: Four times a day (QID) | ORAL | Status: DC | PRN

## 2013-05-29 MED ORDER — FLUTICASONE PROPIONATE HFA 110 MCG/ACT IN AERO
2.0000 | INHALATION_SPRAY | Freq: Two times a day (BID) | RESPIRATORY_TRACT | Status: DC
  Administered 2013-05-29 – 2013-06-02 (×9): 2 via RESPIRATORY_TRACT
  Filled 2013-05-29 (×2): qty 12

## 2013-05-29 MED ORDER — ZINC 10 MG MT LOZG: 10.0000 mg | LOZENGE | Freq: Every day | OROMUCOSAL | Status: DC

## 2013-05-29 MED ORDER — ALBUTEROL SULFATE (2.5 MG/3ML) 0.083% IN NEBU: 3.0000 mL | INHALATION_SOLUTION | Freq: Four times a day (QID) | RESPIRATORY_TRACT | Status: DC | PRN

## 2013-05-29 MED ORDER — DRONABINOL 5 MG PO CAPS
5.0000 mg | ORAL_CAPSULE | Freq: Two times a day (BID) | ORAL | Status: DC
  Administered 2013-05-29 (×2): 5 mg via ORAL
  Filled 2013-05-29 (×2): qty 1

## 2013-05-29 MED ORDER — ATORVASTATIN CALCIUM 40 MG PO TABS
40.0000 mg | ORAL_TABLET | Freq: Every day | ORAL | Status: DC
  Filled 2013-05-29: qty 1

## 2013-05-29 MED ORDER — DOCUSATE SODIUM 283 MG RE ENEM
1.0000 | ENEMA | Freq: Every day | RECTAL | Status: DC | PRN
  Filled 2013-05-29: qty 1

## 2013-05-29 MED ORDER — ENSURE COMPLETE PO LIQD
237.0000 mL | Freq: Three times a day (TID) | ORAL | Status: DC
  Administered 2013-05-29 – 2013-06-02 (×12): 237 mL via ORAL

## 2013-05-29 MED ORDER — ONDANSETRON HCL 4 MG/2ML IJ SOLN
4.0000 mg | Freq: Four times a day (QID) | INTRAMUSCULAR | Status: DC | PRN
Start: 2013-05-29 — End: 2013-06-03

## 2013-05-29 MED ORDER — SENNA 8.6 MG PO TABS: 2.0000 | ORAL_TABLET | Freq: Every day | ORAL | Status: DC

## 2013-05-29 MED ORDER — ACETAMINOPHEN 650 MG RE SUPP
650.0000 mg | Freq: Four times a day (QID) | RECTAL | Status: DC | PRN
Start: 2013-05-29 — End: 2013-06-03

## 2013-05-29 MED ORDER — ACETAMINOPHEN 325 MG PO TABS: 650.0000 mg | ORAL_TABLET | Freq: Four times a day (QID) | ORAL | Status: DC | PRN

## 2013-05-29 MED ORDER — HYDROMORPHONE HCL 2 MG PO TABS: 2.0000 mg | ORAL_TABLET | Freq: Four times a day (QID) | ORAL | Status: DC | PRN

## 2013-05-29 MED ORDER — RAMIPRIL 2.5 MG PO CAPS
2.5000 mg | ORAL_CAPSULE | Freq: Every day | ORAL | Status: DC
  Administered 2013-05-29: 2.5 mg via ORAL
  Filled 2013-05-29 (×2): qty 1

## 2013-05-29 MED ORDER — PROCHLORPERAZINE MALEATE 5 MG PO TABS
5.0000 mg | ORAL_TABLET | Freq: Four times a day (QID) | ORAL | Status: DC | PRN
  Filled 2013-05-29: qty 1

## 2013-05-29 MED ORDER — MELOXICAM 7.5 MG PO TABS
7.5000 mg | ORAL_TABLET | Freq: Every day | ORAL | Status: DC
  Filled 2013-05-29: qty 1

## 2013-05-29 MED ORDER — DOCUSATE SODIUM 100 MG PO CAPS
100.0000 mg | ORAL_CAPSULE | Freq: Two times a day (BID) | ORAL | Status: DC
  Administered 2013-05-30: 100 mg via ORAL

## 2013-05-29 MED ORDER — INSULIN GLARGINE 100 UNIT/ML ~~LOC~~ SOLN
10.0000 [IU] | Freq: Every day | SUBCUTANEOUS | Status: DC
  Administered 2013-05-29: 10 [IU] via SUBCUTANEOUS
  Filled 2013-05-29 (×2): qty 0.1

## 2013-05-29 MED ORDER — MORPHINE SULFATE ER 30 MG PO TBCR
45.0000 mg | EXTENDED_RELEASE_TABLET | Freq: Two times a day (BID) | ORAL | Status: DC
  Administered 2013-05-29 – 2013-06-01 (×8): 45 mg via ORAL
  Filled 2013-05-29 (×16): qty 1

## 2013-05-29 MED ORDER — ZINC SULFATE 220 (50 ZN) MG PO CAPS
220.0000 mg | ORAL_CAPSULE | Freq: Every day | ORAL | Status: DC
  Administered 2013-05-29 – 2013-06-02 (×5): 220 mg via ORAL
  Filled 2013-05-29 (×5): qty 1

## 2013-05-29 MED ORDER — ENOXAPARIN SODIUM 100 MG/ML ~~LOC~~ SOLN
90.0000 mg | Freq: Two times a day (BID) | SUBCUTANEOUS | Status: DC
  Administered 2013-05-29 (×2): 90 mg via SUBCUTANEOUS
  Filled 2013-05-29 (×3): qty 1

## 2013-05-29 MED ORDER — LIDOCAINE-PRILOCAINE 2.5-2.5 % EX CREA
1.0000 "application " | TOPICAL_CREAM | Freq: Once | CUTANEOUS | Status: DC
  Filled 2013-05-29: qty 5

## 2013-05-29 MED ORDER — LACTULOSE 10 GM/15ML PO SOLN
10.0000 g | Freq: Two times a day (BID) | ORAL | Status: DC
  Administered 2013-05-29: 10 g via ORAL
  Filled 2013-05-29 (×4): qty 15

## 2013-05-29 MED ORDER — ASPIRIN EC 81 MG PO TBEC
81.0000 mg | DELAYED_RELEASE_TABLET | Freq: Every day | ORAL | Status: DC
  Administered 2013-05-29: 81 mg via ORAL
  Filled 2013-05-29: qty 1

## 2013-05-29 MED ORDER — PANTOPRAZOLE SODIUM 40 MG PO TBEC
40.0000 mg | DELAYED_RELEASE_TABLET | Freq: Two times a day (BID) | ORAL | Status: DC
  Administered 2013-05-29 – 2013-06-02 (×10): 40 mg via ORAL
  Filled 2013-05-29 (×12): qty 1

## 2013-05-29 MED ORDER — HYDROMORPHONE HCL 2 MG PO TABS
4.0000 mg | ORAL_TABLET | Freq: Four times a day (QID) | ORAL | Status: DC
  Administered 2013-05-30: 4 mg via ORAL
  Filled 2013-05-29: qty 2

## 2013-05-29 MED ORDER — NIACIN ER (ANTIHYPERLIPIDEMIC) 500 MG PO TBCR
1000.0000 mg | EXTENDED_RELEASE_TABLET | Freq: Every day | ORAL | Status: DC
  Administered 2013-05-29: 1000 mg via ORAL
  Filled 2013-05-29 (×2): qty 2

## 2013-05-29 MED ORDER — NITROGLYCERIN 0.4 MG SL SUBL: 0.4000 mg | SUBLINGUAL_TABLET | SUBLINGUAL | Status: DC | PRN

## 2013-05-29 MED ORDER — HYDROCORTISONE 2.5 % RE CREA
TOPICAL_CREAM | Freq: Two times a day (BID) | RECTAL | Status: DC | PRN
  Administered 2013-05-29: 21:00:00 via RECTAL
  Filled 2013-05-29: qty 28.35

## 2013-05-29 MED ORDER — ALBUTEROL SULFATE (2.5 MG/3ML) 0.083% IN NEBU: 2.5000 mg | INHALATION_SOLUTION | RESPIRATORY_TRACT | Status: DC | PRN

## 2013-05-29 NOTE — Consult Note (Signed)
Name: Diane Bundren MRN: [id number] DOB: 06/13/1955    ADMISSION DATE:  06/08/2013 CONSULTATION DATE:  05/29/13  REFERRING MD :  Dr. Sheran Fava PRIMARY SERVICE:  TRH  CHIEF COMPLAINT:  Pleural Effusion   BRIEF PATIENT DESCRIPTION: 58 y/o M with pancreatic cancer and known malignant pleural effusions admitted 2/12 with R sided chest pain and increased pleural drainage.    SIGNIFICANT EVENTS / STUDIES:  2014 - Dx of malignant pleural effusion in setting of pancreatic cancer 03/2013 - seen by Dr [name] 04/2013 - hospitalized for pleurex placement, trapped lung with malignant effusion, PE.  Dc'd on lovenox.  Rx in [place] for cancer trmt's (Folforinox x2 trmt's) ........................................................................................................................................................................ 2/12 - Admit with right sided chest pain, increased pleural drainage from pleurx, increased weakness   LINES / TUBES: R Pleur-X >>>  CULTURES:   ANTIBIOTICS:   HISTORY OF PRESENT ILLNESS:  58 y/o M with PMH of HLD, CAD s/p stents, MI, Carotid Artery Disease, DM, Pancreatic Cancer with ascites & malignant pleural effusion s/p Pleur-X cath placement in 04/2013.  He is on 3L O2 at baseline. Patient was followed by Dr. [name] but has also been seeking treatment in [place] for chemotherapy.  Has had two treatements with Folforinox.  He is followed by Pulmonary at [facility]. Patient presented to [facility] ER on 2/12 with R sided chest pain that radiated to back, increasing weakness, increased drainage from pleurx cath and poor appetite.  ER work up noted tachypnea, normotension, wbc of 21k (unchanged), hgb of 9.7, Na133, and CXR with increased bilateral pleural effusions and stable partial collapse of R lung.  Patient was admitted per [facility].  PCCM consulted for evaluation of effusion & pain.    Wife has been draining effusion based on previous instructions of drain QOD if less than 500 out,  otherwise, drain daily.  The fluid has been like a "rose wine" up till 2/12 and it was then noted to be bloody.  He recently (end of Jan) was started on lovenox for PE. He continues to have significant pain in R chest.  Denies other acute complaints - no fevers, chills, N/V/D, infection at site of pleur-x.    PAST MEDICAL HISTORY :  Past Medical History  Diagnosis Date  . Overweight   . Dyslipidemia   . CAD (coronary artery disease)     [name] (DES) stent 2006; Nuclear stress 05/08, EF 62%, no ischemia  . Ejection fraction     50% catheterization 2006 /  60% nuclear, 2008  . MI (myocardial infarction)   . Kidney stones 1981  . Carotid artery disease     Doppler, May, 2013, 0-39% bilateral  . Ventral hernia     First noted may, 2014  . Pancreatic cancer   . Constipation   . Chemotherapy-induced nausea   . Pleural effusion on right   . Diabetes mellitus type 2 in nonobese   . Cataract    Past Surgical History  Procedure Laterality Date  . Cardiac catheterization  2006    EF 50%; 62% nuclear 2008  . Kidney stone surgery    . Ankle surgery  right  . Coronary angioplasty    . Insertion / placement pleural catheter     Prior to Admission medications   Medication Sig Start Date End Date Taking? Authorizing Provider  albuterol (PROVENTIL HFA;VENTOLIN HFA) 108 (90 BASE) MCG/ACT inhaler Inhale 1-2 puffs into the lungs every 6 (six) hours as needed for wheezing or shortness of breath.   Yes Historical Provider,  MD  aspirin 81 MG EC tablet Take 81 mg by mouth daily.     Yes Historical Provider, MD  atorvastatin (LIPITOR) 40 MG tablet Take 1 tablet (40 mg total) by mouth daily. 09/25/12 10/30/13 Yes Carlena Bjornstad, MD  Cholecalciferol (VITAMIN D-3 PO) Take 1 capsule by mouth daily.   Yes Historical Provider, MD  dronabinol (MARINOL) 5 MG capsule Take 1 capsule (5 mg total) by mouth 2 (two) times daily before lunch and supper. 05/15/13  Yes Domenic Polite, MD  DULoxetine (CYMBALTA) 60 MG  capsule Take 60 mg by mouth daily.   Yes Historical Provider, MD  enoxaparin (LOVENOX) 100 MG/ML injection Inject 0.9 mLs (90 mg total) into the skin every 12 (twelve) hours. 05/15/13  Yes Domenic Polite, MD  feeding supplement, ENSURE COMPLETE, (ENSURE COMPLETE) LIQD Take 237 mLs by mouth 3 (three) times daily between meals. 05/08/13  Yes Adeline C Viyuoh, MD  fluticasone (FLONASE) 50 MCG/ACT nasal spray Place 1 spray into both nostrils daily as needed.  03/14/13  Yes Historical Provider, MD  fluticasone (FLOVENT HFA) 110 MCG/ACT inhaler Inhale 2 puffs into the lungs 2 (two) times daily.   Yes Historical Provider, MD  hydrocortisone (ANUSOL-HC) 2.5 % rectal cream Place rectally 2 (two) times daily as needed for hemorrhoids or itching. 05/08/13  Yes Adeline Saralyn Pilar, MD  HYDROmorphone (DILAUDID) 2 MG tablet Take 2 mg by mouth every 6 (six) hours as needed for moderate pain or severe pain.    Yes Historical Provider, MD  insulin glargine (LANTUS) 100 UNIT/ML injection Inject 10 Units into the skin at bedtime.    Yes Historical Provider, MD  lactulose (CHRONULAC) 10 GM/15ML solution Take 15 mLs (10 g total) by mouth 2 (two) times daily as needed for mild constipation or moderate constipation. 05/08/13  Yes Adeline C Viyuoh, MD  lidocaine-prilocaine (EMLA) cream Apply 1 application topically once.   Yes Historical Provider, MD  meloxicam (MOBIC) 7.5 MG tablet Take 7.5 mg by mouth daily.   Yes Historical Provider, MD  metFORMIN (GLUCOPHAGE) 500 MG tablet Take 1 tablet by mouth daily. 02/18/13  Yes Historical Provider, MD  metoprolol succinate (TOPROL-XL) 25 MG 24 hr tablet Take 1 tablet (25 mg total) by mouth daily. 09/25/12 10/30/13 Yes Carlena Bjornstad, MD  morphine (MS CONTIN) 15 MG 12 hr tablet Take 3 tablets (45 mg total) by mouth every 12 (twelve) hours. 05/15/13  Yes Domenic Polite, MD  niacin (NIASPAN) 1000 MG CR tablet Take 1 tablet (1,000 mg total) by mouth at bedtime. 09/24/12 10/29/13 Yes Carlena Bjornstad,  MD  nitroGLYCERIN (NITROSTAT) 0.4 MG SL tablet Place 1 tablet (0.4 mg total) under the tongue every 5 (five) minutes as needed. May repeat up to 3 doses. 08/08/11  Yes Carlena Bjornstad, MD  ondansetron (ZOFRAN) 4 MG tablet Take 4 mg by mouth every 8 (eight) hours as needed for nausea or vomiting.   Yes Historical Provider, MD  pantoprazole (PROTONIX) 40 MG tablet Take 40 mg by mouth 2 (two) times daily.   Yes Historical Provider, MD  PRESCRIPTION MEDICATION Folfirinox inj 04/28/13 chemo   Yes Historical Provider, MD  prochlorperazine (COMPAZINE) 5 MG tablet Take 5 mg by mouth every 6 (six) hours as needed for nausea or vomiting.   Yes Historical Provider, MD  protein supplement (RESOURCE BENEPROTEIN) 6 g POWD Take 1 scoop (6 g total) by mouth 3 (three) times daily with meals. 05/15/13  Yes Domenic Polite, MD  ramipril (ALTACE) 2.5 MG capsule  Take 2.5 mg by mouth daily.   Yes Historical Provider, MD  sodium chloride (OCEAN) 0.65 % SOLN nasal spray Place 2 sprays into both nostrils at bedtime as needed for congestion.   Yes Historical Provider, MD  Zinc 10 MG LOZG Use as directed 10 mg in the mouth or throat daily.   Yes Historical Provider, MD   No Known Allergies  FAMILY HISTORY:  Family History  Problem Relation Age of Onset  . Liver cancer Sister   . Cancer Paternal Grandmother   . Heart disease Mother   . Heart disease Father   . Stroke Sister   . Stroke Paternal Grandfather    SOCIAL HISTORY:  reports that he has quit smoking. His smoking use included Cigarettes. He has a 25 pack-year smoking history. He has never used smokeless tobacco. He reports that he drinks alcohol. He reports that he does not use illicit drugs.  REVIEW OF SYSTEMS:  See HPI for pertinent details.  All systems reviewed and otherwise negative.   SUBJECTIVE:   VITAL SIGNS: Temp:  [97.4 F (36.3 C)-98.2 F (36.8 C)] 97.4 F (36.3 C) (02/13 0650) Pulse Rate:  [46-113] 97 (02/13 0650) Resp:  [13-20] 17 (02/13  0650) BP: (101-119)/(48-71) 118/67 mmHg (02/13 0650) SpO2:  [94 %-100 %] 95 % (02/13 0818)  PHYSICAL EXAMINATION: General:  Thin, chronically ill in NAD Neuro:  AAOx4, MAE, speech clear HEENT:  Mm pink/moist, no jvd Neck:  Port wire noted over clavicle Cardiovascular:  s1s2 rr, no m/r/g Lungs:  resp's even/non-labored, lungs bilaterally diminished R>L Abdomen:  Round/soft, bsx4 active Musculoskeletal:  No acute deformities Skin:  Warm/dry, no edema   Recent Labs Lab 05/26/2013 2000  NA 133*  K 3.9  CL 94*  CO2 27  BUN 11  CREATININE 0.34*  GLUCOSE 113*    Recent Labs Lab 05/29/2013 2000  HGB 9.7*  HCT 29.8*  WBC 21.8*  PLT 355   Dg Chest 2 View  05/23/2013   CLINICAL DATA:  Increasing shortness of Breath  EXAM: CHEST  2 VIEW  COMPARISON:  05/15/2013  FINDINGS: Right-sided chest wall port and right PleurX catheter are again identified. There remains partial collapse of the right lung but stable from the prior exam. The pleural fluid has increased slightly on the right hand side. A left pleural effusion is noted which is increased in the interval from the prior study. Patchy interstitial changes are again noted within the left lung.  IMPRESSION: Increased bilateral pleural effusions.  Stable partial collapse of the right lung.   Electronically Signed   By: Inez Catalina M.D.   On: 05/25/2013 19:24    ASSESSMENT / PLAN:  Bilateral Pleural Effusions - chronic in the setting of pancreatic cancer with mets.  Now with bloody pleural drainage from pleurex cath.  PE - dx in 04/2013, on lovenox approx 2 wks. Pain - in setting of pancreatic cancer with mets   58 y/o M with pancreatic cancer with malignant effusions and ascites.  Recent dx of PE and rx with lovenox.  Now with bloody & painful drainage from Pleur-X.  This may reflect metastatic progression or catheter related irritation in pleural space in the setting of anti-coagulants.    Plan: -drainage of pleurx cath based on  dyspnea relief -escalate narcotics as needed for patients comfort -continue treatment dose lovenox -catheter position confirmed on CXR -patient and family are open to palliative pain control with ongoing aggressive cancer treatment.  He would not want CPR.   -  assess drainage from site this evening as well as pain response, he has been draining Q24 at [place], NP-C [place] Pgr: [phone] or [phone]  05/29/2013, 10:45 AM Mr.,   STAFF NOTE: I, Dr Ann Lions have personally reviewed patient's available data, including medical history, events of note, physical examination and test results as part of my evaluation. I have discussed with resident/NP and other care providers such as pharmacist, RN and RRT.  In addition,  I personally evaluated patient and elicited key findings of pleural malignant effusion right side. Currently with pain and blood fluid. Suspect pleural abrasion of chest tube +/- high likelihood for pleural mets given pain and bloody drainage. REcommended pleurX remain but improve pain contrl with hellp of inpatient palliative care consult; order placed and I called Dr Hilma Favors  Rest per NP/medical resident whose note is outlined above and that I agree with     Dr. Brand Males, M.D., [facility].C.P Pulmonary and Critical Care Medicine Staff Physician [place] [facility] Pager: [phone], If no answer or between  15:00h - 7:00h: call [phone]  05/29/2013 1:17 PM

## 2013-05-29 NOTE — Progress Notes (Signed)
[facility]  Patient Status: Active (receiving services up to time of hospitalization)  [facility] is providing the following services: RN and PT  If patient discharges after hours, please call [phone].   Jimmy Christensen 05/29/2013, 2:41 PM

## 2013-05-29 NOTE — Progress Notes (Signed)
ANTICOAGULATION CONSULT NOTE - Initial Consult  Pharmacy Consult for Lovenox Indication: pulmonary embolus  No Known Allergies  Patient Measurements:    Vital Signs: Temp: 97.3 F (36.3 C) (02/13 1424) Temp src: Axillary (02/13 1424) BP: 113/53 mmHg (02/13 1424) Pulse Rate: 103 (02/13 1424)  Labs:  Recent Labs  06/07/2013 2000 05/29/13 1320  HGB 9.7* 10.1*  HCT 29.8* 31.0*  PLT 355 321  LABPROT 13.9  --   INR 1.09  --   CREATININE 0.34* 0.43*    The CrCl is unknown because both a height and weight (above a minimum accepted value) are required for this calculation.   Assessment: 76 yoM with h/o pancreatic cancer with recent CT angio chest suspicious for small pulmonary embolism, pt was discharged on 1/30 on Lovenox 90 mg sq q12h. Patient returned 2/12 with R sided chest pain and increased pleural drainage. Per communication with [facility], pt with hemothorax but CCM okay with continuing anticoagulation at this time. Hgb 10.1K, plts wnl. Will need to monitor closely. Patient last weight was 89.3 kg on 05/15/13. Scr 0.43. Last dose of Lovenox 90 mg given at 1010 this AM.    Goal of Therapy:  Anti-Xa level 0.6-1 units/ml 4hrs after LMWH dose given Monitor platelets by anticoagulation protocol: Yes   Plan:   Lovenox 90 mg sq q12h, next dose due at 2200  Pharmacy will f/u closely  Vanessa Savona, PharmD, BCPS Pager: [phone] 7:40 PM Pharmacy #: [id number]

## 2013-05-29 NOTE — Progress Notes (Signed)
VASCULAR LAB PRELIMINARY  PRELIMINARY  PRELIMINARY  PRELIMINARY  Bilateral lower extremity venous duplex completed.    Preliminary report:  Bilateral:  No evidence of DVT, superficial thrombosis, or Baker's Cyst.   Brenton Joines, RVS 05/29/2013, 3:58 PM

## 2013-05-29 NOTE — Progress Notes (Signed)
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 Marin Olp consulted for further management -  Palliative care has seen him previously and if he is deemed to sick to continue chemotherapy, he would like to meet with them again -  Clarified code status and updated in computer system to partial code  Bilateral malignant pleural effusions with right pleurex catheter:  Sudden onset pain and new hemothorax which started yesterday.  Has had increased effusions for the last week with about 753mL drained yesterday.  This bleeding is probably due to friable malignancy in the setting of anticoagulation.   -  Hemoglobin decreased from baseline -  D/C anticoagulation -  Repeat CBC this AM -  Repeat CXR to eval size of effusion -  Pulm consult for assistance with further management -  Option of VATS was considered not appropriate given his progressive malignancy and decreasing functional status -  Continue O2 via nasal cannula and albuterol nebulizer tx  Right sided Pleuritic chest pain improving -  Continue MS contin -  Increase oral dilaudid -  IV dilaudid only for breakthrough pain  Pulmonary embolus "Possible small pulmonary embolism identified in a left lower lobe posterior basilar segmental branch" CT scan 1/26.   -  D/c anticoagulation for now, last dose of lovenox around 10AM so would need some sort of a/c restarted about 10PM -  Pulmonology >> please assist with risk assessment of hemothorax with continued anticoagulation.  Given that this is "possible" and "small," should we consider not anticoagulating?    CAD (coronary artery disease) stable -  Hold aspirin due to hemothorax -  Continue statin and metoprolol   Hyponatremia  And at  baseline. Secondary to malignancy. Continue to monitor   Diabetes mellitus hypoglycemic this morning, A1c 8 last month -  Hold metformin.  -  D/c lantus  -  Continue low dose sliding scale insulin  Protein-calorie malnutrition, severe  Continue nutrition supplements   Anemia of chronic disease hgb decreased from baseline -  Repeat CBC this AM  Weakness generalized  - PT eval.   Constipation -  Schedule lactulose -  Add colace and senna -  Bisacodyl x 1  Diet:  Regular  Access:  port IVF:  yes Proph:  SCDs  Code Status: Partial Family Communication: patient, wife, and oldest daughter Disposition Plan: pending plan for hemothorax and anticoagulation   Consultants:  Oncology, Dr. Marin Olp  Pulmonology  Procedures:  CXR  Antibiotics:  none   HPI/Subjective:  Pain right chest 10/10 at worst and does not completely go away.  Mild SOB but near baseline.  Denies nausea, but early satiety  Objective: Filed Vitals:   06/11/2013 2330 05/29/13 0015 05/29/13 0650 05/29/13 0818  BP: 119/55 113/71 118/67   Pulse: 46 102 97   Temp:  98.2 F (36.8 C) 97.4 F (36.3 C)   TempSrc:  Oral Axillary   Resp: 19 18 17    SpO2: 100% 99% 98% 95%    Intake/Output Summary (Last 24 hours) at 05/29/13 1200 Last data filed at 05/29/13 1110  Gross per 24 hour  Intake  732.5 ml  Output    550 ml  Net  182.5 ml   There were no vitals filed for this visit.  Exam:   General:  CM,  No acute distress  HEENT:  NCAT, MMM  Cardiovascular:  RRR, nl S1, S2 no mrg, 2+ pulses, warm extremities  Respiratory:  Diminished bilateral bases and to the mid-back on the right.  Some BS heard at right apex but still less than left side, no increased WOB  Abdomen:   NABS, soft, NT/ND, palpable mass left abdomen  MSK:   Normal tone and bulk, 2+ RLE and 1+ LLE edema  Neuro:  Grossly intact  Data Reviewed: Basic Metabolic Panel:  Recent Labs Lab 06/06/2013 2000  NA 133*  K 3.9  CL 94*   CO2 27  GLUCOSE 113*  BUN 11  CREATININE 0.34*  CALCIUM 8.2*   Liver Function Tests:  Recent Labs Lab 05/21/2013 2000  AST 34  ALT 33  ALKPHOS 97  BILITOT 0.3  PROT 5.6*  ALBUMIN 1.9*    Recent Labs Lab 06/02/2013 2000  LIPASE 7*   No results found for this basename: AMMONIA,  in the last 168 hours CBC:  Recent Labs Lab 05/26/2013 2000  WBC 21.8*  NEUTROABS 19.0*  HGB 9.7*  HCT 29.8*  MCV 84.7  PLT 355   Cardiac Enzymes: No results found for this basename: CKTOTAL, CKMB, CKMBINDEX, TROPONINI,  in the last 168 hours BNP (last 3 results)  Recent Labs  05/04/13 1135  PROBNP 542.0*   CBG:  Recent Labs Lab 05/29/13 0707 05/29/13 0729  GLUCAP 68* 70    No results found for this or any previous visit (from the past 240 hour(s)).   Studies: Dg Chest 2 View  06/12/2013   CLINICAL DATA:  Increasing shortness of Breath  EXAM: CHEST  2 VIEW  COMPARISON:  05/15/2013  FINDINGS: Right-sided chest wall port and right PleurX catheter are again identified. There remains partial collapse of the right lung but stable from the prior exam. The pleural fluid has increased slightly on the right hand side. A left pleural effusion is noted which is increased in the interval from the prior study. Patchy interstitial changes are again noted within the left lung.  IMPRESSION: Increased bilateral pleural effusions.  Stable partial collapse of the right lung.   Electronically Signed   By: Inez Catalina M.D.   On: 05/26/2013 19:24    Scheduled Meds: . sodium chloride   Intravenous STAT  . aspirin EC  81 mg Oral Daily  . cholecalciferol  3,000 Units Oral Daily  . dronabinol  5 mg Oral BID AC  . DULoxetine  60 mg Oral Daily  . feeding supplement (ENSURE COMPLETE)  237 mL Oral TID BM  . fluticasone  2 puff Inhalation BID  . insulin glargine  10 Units Subcutaneous QHS  . lidocaine-prilocaine  1 application Topical Once  . metoprolol succinate  25 mg Oral Daily  . morphine  45 mg Oral  Q12H  . pantoprazole  40 mg Oral BID  . protein supplement  1 scoop Oral TID WC  . ramipril  2.5 mg Oral Daily  . zinc sulfate  220 mg Oral Daily   Continuous Infusions: . 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 If 7PM-7AM, please contact night-coverage at [email or web address], password [facility] 05/29/2013, 12:00 PM  LOS: 1 day

## 2013-05-29 NOTE — H&P (Addendum)
[facility] History and Physical  Jimmy Christensen [id number] DOB: [birth year] DOA: 06/02/2013  Referring physician: Dr. Rogene Houston PCP: [name], MD   Chief Complaint:  Right-sided chest pain since one day Increased drained from right-sided Pleurx catheter for 1 week   HPI:  58 year old male with past medical hx CAD, hx of MI with DES in 2006, DM,  pancreatic cancer diagnosed in fall 2014 with malignant pleural effusion and ascites status post right-sided pleur-x catheter placement in January with further hospitalization for trapped lung due to malignant pleural effusion followed by pneumothorax and pulmonary embolism who was discharged on Lovenox was brought into the hospital by his wife after he complained of severe right-sided chest pain at the pleur-x catheter site. Patient reports her pain to be sharp and stabbing in nature and radiating to the right mid back. For past one week his wife has been noticing increased pleural fluid drainage from the catheter of about 750 cc alternating with 400 cc every other day. The wife was instructed for daily pleurx drainage  till output <540ml then change to every other day.  Patient has been undergoing treatment with chemotherapy at [facility] in [place] and has had 2 chemotherapy treatments with Folforinox so far. He was supposed to go back to [place] 2 weeks back but since she has been increasingly weak he has not been able to go there. Patient denies headache, dizziness, fever, chills, nausea , vomiting, chest pain, palpitations, increased SOB, worsened abdominal pain, bowel or urinary symptoms. He has very poor appetite , taking mostly  Ensure  and is increasingly weak. Denies any chest trauma. Patient has been following with a pulmonologist at [facility] since his recent discharge. Since patient could not go to [place] for chemotherapy adue to weakness, he was in the process of establishing a care with oncologist in [place]. He was seen  by Dr. [name] in december.  Course in the ED Patient was afebrile and mildly tachycardic 213. Respiratory rate was 20. Blood pressure was normal. Patient maintained O2 sat on 3 L via nasal cannula. (Patient is on home O2) Blood work done showed a leukocytosis with WBC of 21,000 which seemed to be uncharged from previous labs. Hemoglobin is 9.7. Sodium of 133, chloride 94 and glucose of 113. Chest x-ray done showed increased bilateral pleural effusion and a stable partial collapse of right lung.. Given concern for increased bilateral pleural effusion and catheter drain with pleuritic chest pain prior hospitalist consulted for admission.  Review of Systems:  Constitutional: Denies fever, chills, diaphoresis, diminished appetite change and fatigue.  HEENT: Denies photophobia, eye pain, redness, hearing loss, ear pain, congestion, sore throat, rhinorrhea, sneezing, mouth sores, trouble swallowing, neck pain, neck stiffness and tinnitus.   Respiratory: Denies worsening SOB, DOE, cough, chest tightness,  and wheezing.   Cardiovascular: Right-sided chest pain, denies palpitations and leg swelling.  Gastrointestinal: Denies nausea, vomiting, abdominal pain, diarrhea, constipation, blood in stool and abdominal distention.  Genitourinary: Denies dysuria, urgency, frequency, hematuria, flank pain and difficulty urinating.  Endocrine: Denies polyuria, polydipsia. Musculoskeletal: Denies myalgias, back pain, joint swelling, arthralgias and gait problem.  Skin: Denies pallor, rash and wound.  Neurological: Generalized weakness, Denies dizziness, seizures, syncope,  light-headedness, numbness and headaches.  Psychiatric/Behavioral: Denies  confusion, nervousness, sleep disturbance and agitation   Past Medical History  Diagnosis Date  . Overweight   . Dyslipidemia   . CAD (coronary artery disease)     [name] (DES) stent 2006; Nuclear stress 05/08, EF 62%, no ischemia  .  Ejection fraction     50%  catheterization 2006 /  60% nuclear, 2008  . MI (myocardial infarction)   . Kidney stones 1981  . Carotid artery disease     Doppler, May, 2013, 0-39% bilateral  . Ventral hernia     First noted may, 2014  . Pancreatic cancer   . Constipation   . Chemotherapy-induced nausea   . Pleural effusion on right   . Diabetes mellitus type 2 in nonobese   . Cataract    Past Surgical History  Procedure Laterality Date  . Cardiac catheterization  2006    EF 50%; 62% nuclear 2008  . Kidney stone surgery    . Ankle surgery  right  . Coronary angioplasty    . Insertion / placement pleural catheter     Social History:  reports that he has quit smoking. His smoking use included Cigarettes. He has a 25 pack-year smoking history. He has never used smokeless tobacco. He reports that he drinks alcohol. He reports that he does not use illicit drugs.  No Known Allergies  Family History  Problem Relation Age of Onset  . Liver cancer Sister   . Cancer Paternal Grandmother   . Heart disease Mother   . Heart disease Father   . Stroke Sister   . Stroke Paternal Grandfather     Prior to Admission medications   Medication Sig Start Date End Date Taking? Authorizing Provider  albuterol (PROVENTIL HFA;VENTOLIN HFA) 108 (90 BASE) MCG/ACT inhaler Inhale 1-2 puffs into the lungs every 6 (six) hours as needed for wheezing or shortness of breath.   Yes Historical Provider, MD  aspirin 81 MG EC tablet Take 81 mg by mouth daily.     Yes Historical Provider, MD  atorvastatin (LIPITOR) 40 MG tablet Take 1 tablet (40 mg total) by mouth daily. 09/25/12 10/30/13 Yes Carlena Bjornstad, MD  Cholecalciferol (VITAMIN D-3 PO) Take 1 capsule by mouth daily.   Yes Historical Provider, MD  dronabinol (MARINOL) 5 MG capsule Take 1 capsule (5 mg total) by mouth 2 (two) times daily before lunch and supper. 05/15/13  Yes Domenic Polite, MD  DULoxetine (CYMBALTA) 60 MG capsule Take 60 mg by mouth daily.   Yes Historical Provider, MD   enoxaparin (LOVENOX) 100 MG/ML injection Inject 0.9 mLs (90 mg total) into the skin every 12 (twelve) hours. 05/15/13  Yes Domenic Polite, MD  feeding supplement, ENSURE COMPLETE, (ENSURE COMPLETE) LIQD Take 237 mLs by mouth 3 (three) times daily between meals. 05/08/13  Yes Adeline C Viyuoh, MD  fluticasone (FLONASE) 50 MCG/ACT nasal spray Place 1 spray into both nostrils daily as needed.  03/14/13  Yes Historical Provider, MD  fluticasone (FLOVENT HFA) 110 MCG/ACT inhaler Inhale 2 puffs into the lungs 2 (two) times daily.   Yes Historical Provider, MD  hydrocortisone (ANUSOL-HC) 2.5 % rectal cream Place rectally 2 (two) times daily as needed for hemorrhoids or itching. 05/08/13  Yes Adeline Saralyn Pilar, MD  HYDROmorphone (DILAUDID) 2 MG tablet Take 2 mg by mouth every 6 (six) hours as needed for moderate pain or severe pain.    Yes Historical Provider, MD  insulin glargine (LANTUS) 100 UNIT/ML injection Inject 10 Units into the skin at bedtime.    Yes Historical Provider, MD  lactulose (CHRONULAC) 10 GM/15ML solution Take 15 mLs (10 g total) by mouth 2 (two) times daily as needed for mild constipation or moderate constipation. 05/08/13  Yes Adeline Saralyn Pilar, MD  lidocaine-prilocaine (EMLA) cream Apply  1 application topically once.   Yes Historical Provider, MD  meloxicam (MOBIC) 7.5 MG tablet Take 7.5 mg by mouth daily.   Yes Historical Provider, MD  metFORMIN (GLUCOPHAGE) 500 MG tablet Take 1 tablet by mouth daily. 02/18/13  Yes Historical Provider, MD  metoprolol succinate (TOPROL-XL) 25 MG 24 hr tablet Take 1 tablet (25 mg total) by mouth daily. 09/25/12 10/30/13 Yes Carlena Bjornstad, MD  morphine (MS CONTIN) 15 MG 12 hr tablet Take 3 tablets (45 mg total) by mouth every 12 (twelve) hours. 05/15/13  Yes Domenic Polite, MD  niacin (NIASPAN) 1000 MG CR tablet Take 1 tablet (1,000 mg total) by mouth at bedtime. 09/24/12 10/29/13 Yes Carlena Bjornstad, MD  nitroGLYCERIN (NITROSTAT) 0.4 MG SL tablet Place 1 tablet  (0.4 mg total) under the tongue every 5 (five) minutes as needed. May repeat up to 3 doses. 08/08/11  Yes Carlena Bjornstad, MD  ondansetron (ZOFRAN) 4 MG tablet Take 4 mg by mouth every 8 (eight) hours as needed for nausea or vomiting.   Yes Historical Provider, MD  pantoprazole (PROTONIX) 40 MG tablet Take 40 mg by mouth 2 (two) times daily.   Yes Historical Provider, MD  PRESCRIPTION MEDICATION Folfirinox inj 04/28/13 chemo   Yes Historical Provider, MD  prochlorperazine (COMPAZINE) 5 MG tablet Take 5 mg by mouth every 6 (six) hours as needed for nausea or vomiting.   Yes Historical Provider, MD  protein supplement (RESOURCE BENEPROTEIN) 6 g POWD Take 1 scoop (6 g total) by mouth 3 (three) times daily with meals. 05/15/13  Yes Domenic Polite, MD  ramipril (ALTACE) 2.5 MG capsule Take 2.5 mg by mouth daily.   Yes Historical Provider, MD  sodium chloride (OCEAN) 0.65 % SOLN nasal spray Place 2 sprays into both nostrils at bedtime as needed for congestion.   Yes Historical Provider, MD  Zinc 10 MG LOZG Use as directed 10 mg in the mouth or throat daily.   Yes Historical Provider, MD     Physical Exam:  Filed Vitals:   June 04, 2013 2215 04-Jun-2013 2230 04-Jun-2013 2245 06/08/2013 2330  BP: 101/52 119/48 107/58 119/55  Pulse: 99 103 107 46  Temp:      TempSrc:      Resp: 13 19 13 19   SpO2: 94% 100% 98% 100%    Constitutional: Vital signs reviewed.  Patient is a malnourished middle aged male lying in bed in no acute distress HEENT: No pallor, moist oral mucosa, no cervical lymphadenopathy Cardiovascular: RRR, S1 normal, S2 normal, no MRG Chest: rt sided pleurex drain. Diminished breath sounds over bilateral lung bases.,  Abdominal: Soft. Non-tender, non-distended, bowel sounds are normal, no guarding present.  GU: no CVA tenderness Ext: warm, trace edema  Neurological: A&O x3, non focal  Labs on Admission:  Basic Metabolic Panel:  Recent Labs Lab 06/08/2013 2000  NA 133*  K 3.9  CL 94*  CO2 27   GLUCOSE 113*  BUN 11  CREATININE 0.34*  CALCIUM 8.2*   Liver Function Tests:  Recent Labs Lab 05/17/2013 2000  AST 34  ALT 33  ALKPHOS 97  BILITOT 0.3  PROT 5.6*  ALBUMIN 1.9*    Recent Labs Lab 05/22/2013 2000  LIPASE 7*   No results found for this basename: AMMONIA,  in the last 168 hours CBC:  Recent Labs Lab 05/29/2013 2000  WBC 21.8*  NEUTROABS 19.0*  HGB 9.7*  HCT 29.8*  MCV 84.7  PLT 355   Cardiac Enzymes: No results found  for this basename: CKTOTAL, CKMB, CKMBINDEX, TROPONINI,  in the last 168 hours BNP: No components found with this basename: POCBNP,  CBG: No results found for this basename: GLUCAP,  in the last 168 hours  Radiological Exams on Admission: Dg Chest 2 View  06/13/2013   CLINICAL DATA:  Increasing shortness of Breath  EXAM: CHEST  2 VIEW  COMPARISON:  05/15/2013  FINDINGS: Right-sided chest wall port and right PleurX catheter are again identified. There remains partial collapse of the right lung but stable from the prior exam. The pleural fluid has increased slightly on the right hand side. A left pleural effusion is noted which is increased in the interval from the prior study. Patchy interstitial changes are again noted within the left lung.  IMPRESSION: Increased bilateral pleural effusions.  Stable partial collapse of the right lung.   Electronically Signed   By: Inez Catalina M.D.   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 He now has increased drained from the catheter. -continue pleurex drain daily and monitor output. Chest x-ray shows  increased bilateral pleural effusion the patient not in any respiratory distress. Please discuss with pulmonary in the morning regarding further care of pleurex drain. Option of VATS was considered not  appropriate during previous  hospitalization.  -Continue O2 via nasal cannula. Continue albuterol inhaler. We'll add albuterol nebs. -consult oncology in am. Patient does not have an oncologist here and would like to assign with one. Patient and family want to see Dr Marin Olp as his office is closer  to their house.    Active Problems:   Right sided Pleuritic chest pain Resolved at present. continue home pain meds    CAD (coronary artery disease) Continue aspirin, statin and metoprolol    Hyponatremia Secondary to malignancy. Continue to monitor  Diabetes mellitus Hold metformin. Continue Lantus and sliding scale insulin     Protein-calorie malnutrition, severe Continue nutrition supplements    Pulmonary embolus Seen on recent hospitalization and is on therapeutic Lovenox which will be continued.    Anemia of chronic disease Stable. Continue to monitor    Weakness generalized Will get PT eval.      Diet: Clear liquids  DVT prophylaxis: sq lovenox   Code Status: full code.  I discussed about goals of care and wife informs that patient wanted full treatment and was getting chemotherapy in [place]. She would like to have [place] discussion with palliative care if chemotherapy is not  suitable for patient. Will defer to oncology for further discussion about this.  Family Communication: Wife and daughter bedside Disposition Plan: home once improved.  Louellen Molder [facility] Pager [phone]  Total time spent on admission :70 minutes  If 7PM-7AM, please contact night-coverage [email or web address] Password [facility] 05/29/2013, 12:18 AM

## 2013-05-29 NOTE — Care Management Note (Unsigned)
    Page 1 of 2   06/01/2013     5:18:23 PM   CARE MANAGEMENT NOTE 06/01/2013  Patient:  Jimmy Christensen, Jimmy Christensen   Account Number:  [id number]  Date Initiated:  05/29/2013  Documentation initiated by:  Sherrin Daisy  Subjective/Objective Assessment:   dx pleural eusion, ascites; pancreatic cancer; pleurx in place     Action/Plan:   Pt is from home with [place] in place.   Anticipated DC Date:  05/31/2013   Anticipated DC Plan:  [place]  In-house referral  Hospice / [place]  CM consult      [facility] Choice  HOSPICE   Choice offered to / List presented to:  C-1 Patient   DME arranged  OTHER - SEE COMMENT      DME agency  [place] arranged  HH-1 RN      [facility] agency  [facility] [place]   Status of service:  In process, will continue to follow Medicare Important Message given?   (If response is "NO", the following Medicare IM given date fields will be blank) Date Medicare IM given:   Date Additional Medicare IM given:    Discharge Disposition:    Per UR Regulation:  Reviewed for med. necessity/level of care/duration of stay  If discussed at [place] of Stay Meetings, dates discussed:    Comments:  06/01/2013 [place] [phone] Plans are for patient to return to his home where spouse is caregiver. [facility] has been requested. Pt will nee overbed table and shower chair. Will also need CADD pump for pain med administration. [facility] has been notified of DME needs. [facility] will deliver CADD pump to [facility]. Other DME will be del to patient's home.

## 2013-05-29 NOTE — Progress Notes (Signed)
Cline Crock, RN IV assisted floor RN with pleural catheter draining.   337mL of "cranberry colored" liquid drained from catheter.   Patient tolerated this procedure well.

## 2013-05-29 NOTE — Progress Notes (Signed)
INITIAL NUTRITION ASSESSMENT  Pt meets criteria for severe MALNUTRITION in the context of chronic illness as evidenced by pt with <75% estimated energy intake in the past month with severe muscle wasting and subcutaneous fat loss in clavicles.  DOCUMENTATION CODES Per approved criteria  -Severe malnutrition in the context of chronic illness   INTERVENTION:  - Continue Ensure Complete TID  - Continue Beneprotein powder TID - Recommend nursing get updated weight - Will continue to monitor   NUTRITION DIAGNOSIS: Increased nutrient needs related to stage IV pancreatic CA as evidenced by MD notes.   Goal: Pt to consume >90% of meals/supplements  Monitor:  Weights, labs, intake  Reason for Assessment: Malnutrition screening tool   58 y.o. male  Admitting Dx: Pancreatic cancer  ASSESSMENT: Pt with history of CAD, hx of MI, DM, stage IV pancreatic cancer diagnosed in fall 2014 with malignant pleural effusion and ascites status post right-sided pleur-x catheter placement in January with further hospitalization for trapped lung due to malignant pleural effusion followed by pneumothorax and pulmonary embolism who was discharged on Lovenox was brought into the hospital by his wife after he complained of severe right-sided chest pain at the pleur-x catheter site. Patient reports her pain to be sharp and stabbing in nature and radiating to the right mid back. For past one week his wife has been noticing increased pleural fluid drainage from the catheter of about 750 cc alternating with 400 cc every other day. The wife was instructed for daily pleurx drainage till output <538ml then change to every other day. Patient has been undergoing treatment with chemotherapy at [facility] in [place] and has had 2 chemotherapy treatments with Folforinox so far. He was supposed to go back to [place] 2 weeks back but since she has been increasingly weak he has not been able to go there. Patient denies headache,  dizziness, fever, chills, nausea , vomiting, chest pain, palpitations, increased SOB, worsened abdominal pain, bowel or urinary symptoms. He has very poor appetite, taking mostly Ensure and is increasingly weak.  Pt known to RD from past admissions. Attempted to meet with pt several times today, however each time other staff in room talking to or working with pt. RN reports pt with 0% meal intake today.   During admission last month, pt was only able to eat liquids and when pt was eating solid food, he felt like he was going to vomit. Noted pt's weight up 8 pounds since last admission however noted there is no current weight for this admission.     Height: Ht Readings from Last 1 Encounters:  05/11/13 6\' 2"  (1.88 m)    Weight: Wt Readings from Last 1 Encounters:  05/15/13 196 lb 13.9 oz (89.3 kg)    Ideal Body Weight: 190 lb   % Ideal Body Weight: 103%  Wt Readings from Last 10 Encounters:  05/15/13 196 lb 13.9 oz (89.3 kg)  05/07/13 198 lb 10.2 oz (90.1 kg)  05/02/13 188 lb (85.276 kg)  04/02/13 207 lb 8 oz (94.121 kg)  09/12/12 256 lb (116.121 kg)  08/08/11 266 lb (120.657 kg)  05/06/11 259 lb 14.8 oz (117.9 kg)  10/13/10 261 lb 12.8 oz (118.752 kg)  09/22/09 257 lb (116.574 kg)  09/22/08 241 lb (109.317 kg)    Usual Body Weight: 218 lb   % Usual Body Weight: 90%  BMI:  25.3 kg/(m^2)  Estimated Nutritional Needs: Kcal: 2200-2550  Protein: 105-130g  Fluid: 2.2-2.5L/day   Skin: Intact   Diet  Order: General  EDUCATION NEEDS: -No education needs identified at this time   Intake/Output Summary (Last 24 hours) at 05/29/13 1136 Last data filed at 05/29/13 1110  Gross per 24 hour  Intake  732.5 ml  Output    550 ml  Net  182.5 ml    Last BM: 2/5   Labs:   Recent Labs Lab 06/08/2013 2000  NA 133*  K 3.9  CL 94*  CO2 27  BUN 11  CREATININE 0.34*  CALCIUM 8.2*  GLUCOSE 113*    CBG (last 3)   Recent Labs  05/29/13 0707 05/29/13 0729  GLUCAP  68* 70    Scheduled Meds: . sodium chloride   Intravenous STAT  . aspirin EC  81 mg Oral Daily  . cholecalciferol  3,000 Units Oral Daily  . dronabinol  5 mg Oral BID AC  . DULoxetine  60 mg Oral Daily  . feeding supplement (ENSURE COMPLETE)  237 mL Oral TID BM  . fluticasone  2 puff Inhalation BID  . insulin glargine  10 Units Subcutaneous QHS  . lidocaine-prilocaine  1 application Topical Once  . metoprolol succinate  25 mg Oral Daily  . morphine  45 mg Oral Q12H  . pantoprazole  40 mg Oral BID  . protein supplement  1 scoop Oral TID WC  . ramipril  2.5 mg Oral Daily  . zinc sulfate  220 mg Oral Daily    Continuous Infusions: . sodium chloride 100 mL/hr at 05/29/13 7622    Past Medical History  Diagnosis Date  . Overweight   . Dyslipidemia   . CAD (coronary artery disease)     [name] (DES) stent 2006; Nuclear stress 05/08, EF 62%, no ischemia  . Ejection fraction     50% catheterization 2006 /  60% nuclear, 2008  . MI (myocardial infarction)   . Kidney stones 1981  . Carotid artery disease     Doppler, May, 2013, 0-39% bilateral  . Ventral hernia     First noted may, 2014  . Pancreatic cancer   . Constipation   . Chemotherapy-induced nausea   . Pleural effusion on right   . Diabetes mellitus type 2 in nonobese   . Cataract     Past Surgical History  Procedure Laterality Date  . Cardiac catheterization  2006    EF 50%; 62% nuclear 2008  . Kidney stone surgery    . Ankle surgery  right  . Coronary angioplasty    . Insertion / placement pleural catheter      Mikey College MS, RD, LDN [phone] Pager [phone] After Hours Pager

## 2013-05-30 DIAGNOSIS — I2699 Other pulmonary embolism without acute cor pulmonale: Secondary | ICD-10-CM

## 2013-05-30 DIAGNOSIS — R634 Abnormal weight loss: Secondary | ICD-10-CM

## 2013-05-30 DIAGNOSIS — E871 Hypo-osmolality and hyponatremia: Secondary | ICD-10-CM

## 2013-05-30 DIAGNOSIS — I2510 Atherosclerotic heart disease of native coronary artery without angina pectoris: Secondary | ICD-10-CM

## 2013-05-30 LAB — BASIC METABOLIC PANEL
BUN: 9 mg/dL (ref 6–23)
CO2: 28 meq/L (ref 19–32)
Calcium: 8.2 mg/dL — ABNORMAL LOW (ref 8.4–10.5)
Chloride: 95 mEq/L — ABNORMAL LOW (ref 96–112)
Creatinine, Ser: 0.36 mg/dL — ABNORMAL LOW (ref 0.50–1.35)
GFR calc Af Amer: >90 mL/min (ref >90)
GLUCOSE: 97 mg/dL (ref 70–99)
POTASSIUM: 3.9 meq/L (ref 3.7–5.3)
Sodium: 133 mEq/L — ABNORMAL LOW (ref 137–147)

## 2013-05-30 LAB — CBC
HEMATOCRIT: 28.8 % — AB (ref 39.0–52.0)
HEMOGLOBIN: 9.4 g/dL — AB (ref 13.0–17.0)
MCH: 27.9 pg (ref 26.0–34.0)
MCHC: 32.6 g/dL (ref 30.0–36.0)
MCV: 85.5 fL (ref 78.0–100.0)
Platelets: 328 10*3/uL (ref 150–400)
RBC: 3.37 MIL/uL — AB (ref 4.22–5.81)
RDW: 17.3 % — ABNORMAL HIGH (ref 11.5–15.5)
WBC: 18.7 10*3/uL — ABNORMAL HIGH (ref 4.0–10.5)

## 2013-05-30 LAB — GLUCOSE, CAPILLARY
GLUCOSE-CAPILLARY: 125 mg/dL — AB (ref 70–99)
Glucose-Capillary: 102 mg/dL — ABNORMAL HIGH (ref 70–99)
Glucose-Capillary: 155 mg/dL — ABNORMAL HIGH (ref 70–99)
Glucose-Capillary: 189 mg/dL — ABNORMAL HIGH (ref 70–99)

## 2013-05-30 LAB — CANCER ANTIGEN 19-9: CA 19-9: 53517.3 U/mL — ABNORMAL HIGH (ref <35.0)

## 2013-05-30 MED ORDER — DRONABINOL 5 MG PO CAPS
5.0000 mg | ORAL_CAPSULE | Freq: Three times a day (TID) | ORAL | Status: DC
  Administered 2013-05-30 – 2013-06-02 (×10): 5 mg via ORAL
  Filled 2013-05-30 (×10): qty 1

## 2013-05-30 MED ORDER — LACTULOSE 10 GM/15ML PO SOLN
10.0000 g | Freq: Every day | ORAL | Status: DC | PRN
  Filled 2013-05-30: qty 15

## 2013-05-30 MED ORDER — HYDROMORPHONE HCL 2 MG PO TABS
4.0000 mg | ORAL_TABLET | ORAL | Status: DC
  Administered 2013-05-30 – 2013-06-01 (×11): 4 mg via ORAL
  Filled 2013-05-30 (×11): qty 2

## 2013-05-30 NOTE — Progress Notes (Signed)
I saw [name] this morning. I will leave a full consult note later.  Mr. Jimmy Christensen has a very bad problem. His metastatic pancreatic cancer. He has had 2 cycles of a very aggressive a chemotherapy. This was done elsewhere. His last chemotherapy was given about a month ago.  He clearly is progressing. He now has a malignant pleural effusion. He has ascites. He has a Pleurx catheter in his right chest wall. This is being drained daily.  He lost a lot of weight. There is no weight on him from this admission although the chart says 196.  His prealbumin is less than 3. His CA 19-9 is over 53,000. He appears on the borderline of cachexia. He's not eating much. He just cannot eat that much.  His performance status is ECOG 3.  I just do not see that he is going to benefit from second line chemotherapy. I just do not see that he is strong enough to take chemotherapy. The chance of chemotherapy benefiting him would be less than 5%.   He wants to try to go into a clinical trial. I don't think he would qualify for a clinical trial. I told him that if he felt this was going to help him, I would have no problems. He talked about going to [facility] Again, I don't think he would qualify for clinical trial because of his performance status. However, sometimes, academic [facility] Centers will make certain concessions to get patient is on protocol.  I agree with palliative care see him. I think they would be able to provide some guidance.  His wife is not present. I will have to try and talk with her.  I feel bad for Mr. Eskew. I don't see that I would be able to help him by treating him with chemotherapy that would have a very little chance of working and have a high risk of toxicity.  I think his nutritional state needs to be optimized, if possible.  I will continue to follow along and try to help with management issues.  He did not want to hear how long I thought he had prognostic wise. Personally, I  would be surprised if he made it more than 2 more months. Again, he did not want to hear how long he had. As such I do not share my opinion with him.  Also a pray hard for him and his family.Marland Kitchen  Pete E.  Hebrews 12:12

## 2013-05-30 NOTE — Progress Notes (Signed)
Palliative Medicine Consult received. I originally consulted on Mr. Sesay on 05/04/2013 upon entry into our health system. He has been actively managed for Pancreatic Cancer by [facility] in [place], [place]. He has unfortunately had two readmissions since I saw him last month and is as expected declining. While he has never been open to absolutes on prognostication my impression at the time of my consultation was that he was realistic within a framework of wanting to exhaust all options. He also expressed a need to fight even if that included continued interventions and academic center level care. I am happy to re-meet with patient and his family and solidify new goals of care that are achievable and realistic. I suspect that he will want to exhaust all options before shifting towards hospice. Will schedule a time when his family can be at bedside this weekend.  Lane Hacker, DO Palliative Medicine

## 2013-05-30 NOTE — Progress Notes (Signed)
[facility] PROGRESS NOTE  Aleksei Goodlin TKW:[id number] DOB: 10-07-1955 DOA: 05/31/13 PCP: [name], MD  Assessment/Plan  Pancreatic cancer with malignant pleural effusions and ascites, patient too ill to return to [place] for ongoing treatment and would like to establish care here -  Dr. Marin Olp consulted:  No further chemotherapy  -  Palliative care consulted -  Clarified code status and updated in computer system to partial code  Bilateral malignant pleural effusions with right pleurex catheter:  Sudden onset pain and new bloody effusion which started yesterday.  Has had increased effusions for the last week with about 757mL drained yesterday.  This bleeding is probably due to friable malignancy in the setting of anticoagulation.   -  Hemoglobin decreased from baseline -  Appreciate Pulm assistance -  Continue O2 via nasal cannula and albuterol nebulizer tx -  Per pulmonology, they are not particularly worried about sudden acute bleeding or life-threatening hemothorax, however, he will be at risk for anemia requiring blood transfusions.  Per pulm, his lovenox is probably not contributing significantly to his bloody effusion.   Right sided Pleuritic chest pain persistent -  Continue MS contin -  Continue cymbalta -  Change oral dilaudid to 4mg  q4h scheduled -  IV dilaudid for breakthrough pain -  Discussed possibility of increasing MS contin, but will increase oral dilaudid for now and readdress pain tomorrow  Pulmonary embolus "Possible small pulmonary embolism identified in a left lower lobe posterior basilar segmental branch" CT scan 1/26.  Discussed findings with pulmonology.  -  Duplex LE negative for DVT -  Pulm stated we could continue FD A/C or consider decreasing to DVT prophylaxis dose lovenox if bloody effusion worsens or threatens significant anemia  CAD (coronary artery disease) s/p DES 2006 stable -  Hold aspirin -  Continue statin and metoprolol  -  D/c ACEI due  to low blood pressures -  Place hold parameters on beta blocker for blood pressure -  Will continue to address utility of these medications with patient and family >> will also be addressed by palliative  Hyponatremia  And at baseline. Secondary to malignancy. Continue to monitor   Diabetes mellitus CBG well controlled, A1c 8 last month -  Continue low dose sliding scale insulin  Protein-calorie malnutrition, severe  Continue nutrition supplements   Anemia of chronic disease hgb decreased from baseline -  Repeat CBC in AM  Weakness generalized  - PT eval.   Constipation resolved, now with diarrhea -  Change lactulose to prn -  Continue colace and senna  Diet:  Regular  Access:  port IVF:  yes Proph:  SCDs  Code Status: Partial Family Communication: patient, wife, and oldest daughter Disposition Plan:     Consultants:  Oncology, Dr. Marin Olp  Pulmonology  Procedures:  CXR  Antibiotics:  none   HPI/Subjective:  Pain goes down to 1-2 at rest, but increases to 7/10 with movement, denies dyspnea.  Effusion was cranberry colored yesterday.    Objective: Filed Vitals:   05/29/13 2123 05/30/13 0631 05/30/13 0847 05/30/13 1334  BP: 105/65 112/65  104/59  Pulse: 106 108  106  Temp: 98.3 F (36.8 C) 98.1 F (36.7 C)  98.1 F (36.7 C)  TempSrc: Oral Oral  Oral  Resp: 18 18  16   SpO2: 98% 91% 94% 94%    Intake/Output Summary (Last 24 hours) at 05/30/13 1513 Last data filed at 05/30/13 0200  Gross per 24 hour  Intake    260 ml  Output  350 ml  Net    -90 ml   There were no vitals filed for this visit.  Exam:   General:  Cachectic CM, No acute distress, nasal canula in place  HEENT:  NCAT, MMM  Cardiovascular:  RRR, nl S1, S2 no mrg, 2+ pulses, warm extremities  Respiratory:  Diminished bilateral bases and to the mid-back on the right.  Some BS heard at right apex but still less than left side, no increased WOB, stable from yesterday  Abdomen:    NABS, soft, NT/ND, palpable mass left abdomen  MSK:   Normal tone and bulk, 2+ RLE and 1+ LLE edema  Neuro:  Grossly intact  Data Reviewed: Basic Metabolic Panel:  Recent Labs Lab 06/07/2013 2000 05/29/13 1320 05/30/13 0600  NA 133* 136* 133*  K 3.9 4.1 3.9  CL 94* 98 95*  CO2 27 29 28   GLUCOSE 113* 146* 97  BUN 11 10 9   CREATININE 0.34* 0.43* 0.36*  CALCIUM 8.2* 8.2* 8.2*   Liver Function Tests:  Recent Labs Lab 05/22/2013 2000  AST 34  ALT 33  ALKPHOS 97  BILITOT 0.3  PROT 5.6*  ALBUMIN 1.9*    Recent Labs Lab 05/25/2013 2000  LIPASE 7*   No results found for this basename: AMMONIA,  in the last 168 hours CBC:  Recent Labs Lab 05/20/2013 2000 05/29/13 1320 05/30/13 0600  WBC 21.8* 17.8* 18.7*  NEUTROABS 19.0*  --   --   HGB 9.7* 10.1* 9.4*  HCT 29.8* 31.0* 28.8*  MCV 84.7 86.6 85.5  PLT 355 321 328   Cardiac Enzymes: No results found for this basename: CKTOTAL, CKMB, CKMBINDEX, TROPONINI,  in the last 168 hours BNP (last 3 results)  Recent Labs  05/04/13 1135  PROBNP 542.0*   CBG:  Recent Labs Lab 05/29/13 1225 05/29/13 1616 05/29/13 2106 05/30/13 0719 05/30/13 1115  GLUCAP 126* 161* 186* 102* 125*    No results found for this or any previous visit (from the past 240 hour(s)).   Studies: Dg Chest 2 View  05/29/2013   CLINICAL DATA:  Increasing shortness of Breath  EXAM: CHEST  2 VIEW  COMPARISON:  05/15/2013  FINDINGS: Right-sided chest wall port and right PleurX catheter are again identified. There remains partial collapse of the right lung but stable from the prior exam. The pleural fluid has increased slightly on the right hand side. A left pleural effusion is noted which is increased in the interval from the prior study. Patchy interstitial changes are again noted within the left lung.  IMPRESSION: Increased bilateral pleural effusions.  Stable partial collapse of the right lung.   Electronically Signed   By: Inez Catalina M.D.   On:  05/31/2013 19:24   Dg Chest Port 1 View  05/29/2013   CLINICAL DATA:  Evaluate effusions  EXAM: PORTABLE CHEST - 1 VIEW  COMPARISON:  05/19/2013  FINDINGS: A right chest wall porta catheter is appreciated the and internal jugular approach with tip projecting region of superior vena caval right atrial junction. Stable partial collapse of the right lung is appreciated. There is been increased size of the patient's right pleural effusion. A chest tube is appreciated within the right lung base. The left pleural effusion appears stable. Patchy infiltrates identified within the left hemi thorax with increased confluence within the lateral upper lobe region. The cardiac silhouette is enlarged. The osseous structures are unremarkable.  IMPRESSION: Hydro pneumothorax on the right with increased size of the pleural effusion component. The  apical pneumothorax and lung collapse on the right appear stable.  There is increased conspicuity of the interstitial infiltrate in the left hemi thorax likely reflecting a component of pulmonary edema. A focal area of atelectasis focal versus focal component is identified within the lateral aspect of the left upper hemi thorax. The left pleural effusion appears stable. Continued surveillance evaluation is recommended.   Electronically Signed   By: Margaree Mackintosh M.D.   On: 05/29/2013 12:26    Scheduled Meds: . cholecalciferol  3,000 Units Oral Daily  . docusate sodium  100 mg Oral BID  . dronabinol  5 mg Oral TID AC  . DULoxetine  60 mg Oral Daily  . enoxaparin (LOVENOX) injection  90 mg Subcutaneous Q12H  . feeding supplement (ENSURE COMPLETE)  237 mL Oral TID BM  . fluticasone  2 puff Inhalation BID  . HYDROmorphone  4 mg Oral Q4H  . lidocaine-prilocaine  1 application Topical Once  . metoprolol succinate  25 mg Oral Daily  . morphine  45 mg Oral Q12H  . pantoprazole  40 mg Oral BID  . protein supplement  1 scoop Oral TID WC  . zinc sulfate  220 mg Oral Daily    Continuous Infusions: . sodium chloride 50 mL/hr at 05/29/13 2251    Principal Problem:   Pancreatic cancer with malignant pleural effusions and ascites Active Problems:   CAD (coronary artery disease)   Hyponatremia   Diabetes mellitus type 2 in nonobese   Protein-calorie malnutrition, severe   Pulmonary embolus   Anemia of chronic disease   Pleuritic chest pain   Weakness generalized   Pleural effusion, malignant   Pleural effusion on right    Time spent: 30 min    Yakima Kreitzer, [facility] Pager [phone]. If 7PM-7AM, please contact night-coverage at [email or web address], password [facility] 05/30/2013, 3:13 PM  LOS: 2 days

## 2013-05-30 NOTE — Progress Notes (Signed)
Held pm dose of Colace d/t loose frequent BMs

## 2013-05-31 DIAGNOSIS — C259 Malignant neoplasm of pancreas, unspecified: Secondary | ICD-10-CM

## 2013-05-31 DIAGNOSIS — Z515 Encounter for palliative care: Secondary | ICD-10-CM

## 2013-05-31 DIAGNOSIS — J910 Malignant pleural effusion: Secondary | ICD-10-CM

## 2013-05-31 LAB — CBC
HCT: 28.2 % — ABNORMAL LOW (ref 39.0–52.0)
Hemoglobin: 9.4 g/dL — ABNORMAL LOW (ref 13.0–17.0)
MCH: 28.4 pg (ref 26.0–34.0)
MCHC: 33.3 g/dL (ref 30.0–36.0)
MCV: 85.2 fL (ref 78.0–100.0)
PLATELETS: 327 10*3/uL (ref 150–400)
RBC: 3.31 MIL/uL — AB (ref 4.22–5.81)
RDW: 17.5 % — AB (ref 11.5–15.5)
WBC: 17.8 10*3/uL — ABNORMAL HIGH (ref 4.0–10.5)

## 2013-05-31 LAB — GLUCOSE, CAPILLARY
GLUCOSE-CAPILLARY: 121 mg/dL — AB (ref 70–99)
GLUCOSE-CAPILLARY: 151 mg/dL — AB (ref 70–99)
GLUCOSE-CAPILLARY: 120 mg/dL — AB (ref 70–99)
Glucose-Capillary: 132 mg/dL — ABNORMAL HIGH (ref 70–99)

## 2013-05-31 LAB — CLOSTRIDIUM DIFFICILE BY PCR: Toxigenic C. Difficile by PCR: NEGATIVE

## 2013-05-31 MED ORDER — ENOXAPARIN SODIUM 80 MG/0.8ML ~~LOC~~ SOLN
80.0000 mg | Freq: Two times a day (BID) | SUBCUTANEOUS | Status: DC
Start: 2013-05-31 — End: 2013-05-31
  Filled 2013-05-31: qty 0.8

## 2013-05-31 MED ORDER — SODIUM CHLORIDE 0.9 % IJ SOLN
10.0000 mL | INTRAMUSCULAR | Status: DC | PRN
  Administered 2013-05-31: 10 mL

## 2013-05-31 MED ORDER — ENOXAPARIN SODIUM 40 MG/0.4ML ~~LOC~~ SOLN
40.0000 mg | Freq: Every day | SUBCUTANEOUS | Status: DC
  Administered 2013-06-01 – 2013-06-02 (×2): 40 mg via SUBCUTANEOUS
  Filled 2013-05-31 (×2): qty 0.4

## 2013-05-31 NOTE — Progress Notes (Signed)
Pharmacy: Lovenox note   Lovenox continued from prior to admission for Hx of PE.    Noted that patient has a history of hemothorax, and now with new bloody effusion noted on 2/13.    Per pulmonology, they are not particularly worried about sudden acute bleeding or life-threatening hemothorax, however, he will be at risk for anemia requiring blood transfusions. Per pulm, his lovenox is probably not contributing significantly to his bloody effusion. Pulm stated we could continue full dose A/C or consider decreasing to DVT prophylaxis dose lovenox if bloody effusion worsens or threatens significant anemia  Had RN obtain updated weight today to insure Lovenox dose correct.   Weight today is 83 kg.    Plan: 1.) Reduce Lovenox dose to 80 mg SQ Q12h 2.) will f/u with MD recommendations for Lovenox dosing - full vs. Vs reduced dose if bloody effusion worsens.    BorgerdingGaye Alken PharmD Pager #: [phone] 4:37 PM 05/31/2013

## 2013-05-31 NOTE — Progress Notes (Signed)
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  Noticed that is thinking is becoming less clear.   -  Dr. Marin Olp consulted:  No further chemotherapy  -  Palliative care consulted, to see patient this evening -  Discussed prognosis privately with wife -  Discussed hospice vs. Home health services with patient, wife and daughter and they would like to pursue hospice care.   -  Consult for home hospice placed >> patient states he feels strong enough to return home at this time  Bilateral malignant pleural effusions with right pleurex catheter:  Persistent bloody effusion. -  Hemoglobin decreased from baseline but stable -  Appreciate Pulm assistance -  Continue O2 via nasal cannula and albuterol nebulizer tx -  Per pulmonology, they are not particularly worried about sudden acute bleeding or life-threatening hemothorax, however, he will be at risk for anemia requiring blood transfusions.    Right sided Pleuritic chest pain persistent -  Continue MS contin -  Continue cymbalta -  Continue oral dilaudid to 4mg  q4h scheduled  -  IV dilaudid for breakthrough pain -  Palliative care to address pain management this evening, possible home IV infusion  Pulmonary embolus "Possible small pulmonary embolism identified in a left lower lobe posterior basilar segmental branch" CT scan 1/26.  -  Duplex LE negative for DVT -  Discussed treatment options with family:   -----------1.  Continue full a/c which could lead to progressive hemorrhage, anemia, increased SOB, but would cont to treat PE and prevent DVT.   -----------2.  DVT proph dose lovenox to prevent more blood clots, but would not adequately treat PE, probably decreased risk of hemorrhage. -----------3.   No A/C.  Risk of PE and DVT, least risk of hemorrhage.  GIven metastatic pancreatitic ca, high risk for DVT/PE.   Family decided to pursue  DVT proph dose lovenox.    CAD (coronary artery disease) s/p DES 2006 stable -  Hold aspirin due to bleeding -  Continue statin and metoprolol  -  D/c ACEI due to low blood pressures -  Place hold parameters on beta blocker for blood pressure  Hyponatremia  And at baseline. Secondary to malignancy. Continue to monitor   Diabetes mellitus CBG well controlled, A1c 8 last month -  Continue low dose sliding scale insulin  Protein-calorie malnutrition, severe  Continue nutrition supplements   Anemia of chronic disease hgb decreased from baseline -  Repeat CBC in AM  Weakness generalized  - PT eval.   Constipation resolved, now with diarrhea -  Change lactulose to prn -  Continue colace and senna  Diet:  Regular  Access:  port IVF:  yes Proph:  lovenox  Code Status: Partial Family Communication: patient, wife, and oldest daughter Disposition Plan:  Home with hospice tomorrow.   Consultants:  Oncology, Dr. Marin Olp  Pulmonology  Procedures:  CXR  Antibiotics:  none   HPI/Subjective:  Pain still present.  Diarrhea slowing.  Effusion still bloody.    Objective: Filed Vitals:   05/31/13 1110 05/31/13 1316 05/31/13 1446 05/31/13 1614  BP:   114/71   Pulse:   105   Temp:   98.4 F (36.9 C)   TempSrc:   Oral   Resp:   16   Height:    6'  2.02" (1.88 m)  Weight:  83.553 kg (184 lb 3.2 oz)    SpO2: 96%  95%     Intake/Output Summary (Last 24 hours) at 05/31/13 1838 Last data filed at 05/31/13 1725  Gross per 24 hour  Intake   2360 ml  Output    500 ml  Net   1860 ml   Filed Weights   05/31/13 1316  Weight: 83.553 kg (184 lb 3.2 oz)    Exam:   General:  Cachectic CM, No acute distress, nasal canula in place, stable from yesterday  HEENT:  NCAT, MMM  Cardiovascular:  RRR, nl S1, S2 no mrg, 2+ pulses, warm  extremities  Respiratory:  Diminished bilateral bases and to the mid-back on the right.  Some BS heard at right apex but still less than left side, no increased WOB, stable from yesterday  Abdomen:   NABS, soft, NT/ND, palpable mass left abdomen  MSK:   Normal tone and bulk, 2+ RLE and 1+ LLE edema  Neuro:  Grossly intact  Data Reviewed: Basic Metabolic Panel:  Recent Labs Lab 05/18/2013 2000 05/29/13 1320 05/30/13 0600  NA 133* 136* 133*  K 3.9 4.1 3.9  CL 94* 98 95*  CO2 27 29 28   GLUCOSE 113* 146* 97  BUN 11 10 9   CREATININE 0.34* 0.43* 0.36*  CALCIUM 8.2* 8.2* 8.2*   Liver Function Tests:  Recent Labs Lab 06/01/2013 2000  AST 34  ALT 33  ALKPHOS 97  BILITOT 0.3  PROT 5.6*  ALBUMIN 1.9*    Recent Labs Lab 05/31/2013 2000  LIPASE 7*   No results found for this basename: AMMONIA,  in the last 168 hours CBC:  Recent Labs Lab 05/29/2013 2000 05/29/13 1320 05/30/13 0600 05/31/13 0544  WBC 21.8* 17.8* 18.7* 17.8*  NEUTROABS 19.0*  --   --   --   HGB 9.7* 10.1* 9.4* 9.4*  HCT 29.8* 31.0* 28.8* 28.2*  MCV 84.7 86.6 85.5 85.2  PLT 355 321 328 327   Cardiac Enzymes: No results found for this basename: CKTOTAL, CKMB, CKMBINDEX, TROPONINI,  in the last 168 hours BNP (last 3 results)  Recent Labs  05/04/13 1135  PROBNP 542.0*   CBG:  Recent Labs Lab 05/30/13 1721 05/30/13 2137 05/31/13 0736 05/31/13 1201 05/31/13 1657  GLUCAP 155* 189* 121* 132* 151*    Recent Results (from the past 240 hour(s))  CLOSTRIDIUM DIFFICILE BY PCR     Status: None   Collection Time    05/31/13 10:43 AM      Result Value Ref Range Status   C difficile by pcr NEGATIVE  NEGATIVE Final   Comment: Performed at [facility]     Studies: No results found.  Scheduled Meds: . cholecalciferol  3,000 Units Oral Daily  . dronabinol  5 mg Oral TID AC  . DULoxetine  60 mg Oral Daily  . [START ON 06/01/2013] enoxaparin (LOVENOX) injection  40 mg Subcutaneous Daily  .  feeding supplement (ENSURE COMPLETE)  237 mL Oral TID BM  . fluticasone  2 puff Inhalation BID  . HYDROmorphone  4 mg Oral Q4H  . lidocaine-prilocaine  1 application Topical Once  . metoprolol succinate  25 mg Oral Daily  . morphine  45 mg Oral Q12H  . pantoprazole  40 mg Oral BID  . protein supplement  1 scoop Oral TID WC  . zinc sulfate  220 mg Oral Daily   Continuous Infusions: . 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 If 7PM-7AM, please contact night-coverage at [email or web address], password [facility] 05/31/2013, 6:38 PM  LOS: 3 days

## 2013-05-31 NOTE — Progress Notes (Signed)
Held scheduled 0400 Dilaudid d/t pt being asleep w/o complaint

## 2013-05-31 NOTE — Consult Note (Addendum)
Palliative Medicine Team  Consult  58 yo advanced metastatic pancreatic cancer, large recurring malignant effusion with pleurx drain. Progressively weaker. Minimal PO.   I met with [place], his wife and his daughter for goals of care and symptom management. Jimmy Christensen has a strong will to live but has come to a place of acceptance and is coping well with his current situation. He is open to Hospice and looks forward to going home and staying out of the hospital.  He still has difficult to manage pain but says for the most part his current regimen is extremely helpful-his main concern is that when he does have breakthrough pain it comes on suddenly and once it starts he needs IV medication for relief. He has required several doses on IV dilaudid since the 2/13 (4 total 1-59m) usually after he is drained. I think PCA will be necerssary for pain control.  Recommendations:  Home with Hospice (I have spoken with [facility] RN)-hopefully tomorrow AM  Transition oral scheduled dosing of dilaudid to IV continuous infusion with PCA boluses q2 prn.  Continue MSContin  Use his Port-a-cath for infusion  Pleurx drain prn/dyspnea  [facility] script to read:  Hydromorphone 0.232mhr continuous infusion via CAD pump with 9m43molus q1 hour prn for breakthrough pain via PCA. Pharmacy to determine concentration. Hospice Patient.   I have discussed IV pain medication at home for his comfort-I anticipate he will decline quickly. His wife is a strong support and committed to his care.  He is also DNR.  EliLane HackerO Palliative Medicine  Prognosis: <2 weeks

## 2013-06-01 ENCOUNTER — Encounter: Payer: Self-pay | Admitting: Nurse Practitioner

## 2013-06-01 DIAGNOSIS — J90 Pleural effusion, not elsewhere classified: Secondary | ICD-10-CM

## 2013-06-01 DIAGNOSIS — Z515 Encounter for palliative care: Secondary | ICD-10-CM

## 2013-06-01 DIAGNOSIS — D72829 Elevated white blood cell count, unspecified: Secondary | ICD-10-CM

## 2013-06-01 LAB — GLUCOSE, CAPILLARY
GLUCOSE-CAPILLARY: 107 mg/dL — AB (ref 70–99)
Glucose-Capillary: 128 mg/dL — ABNORMAL HIGH (ref 70–99)
Glucose-Capillary: 139 mg/dL — ABNORMAL HIGH (ref 70–99)
Glucose-Capillary: 130 mg/dL — ABNORMAL HIGH (ref 70–99)

## 2013-06-01 MED ORDER — ENOXAPARIN SODIUM 40 MG/0.4ML ~~LOC~~ SOLN: 40.0000 mg | Freq: Every day | SUBCUTANEOUS | Status: AC

## 2013-06-01 MED ORDER — LACTULOSE 10 GM/15ML PO SOLN: 10.0000 g | Freq: Every day | ORAL | Status: AC | PRN

## 2013-06-01 MED ORDER — SENNA 8.6 MG PO TABS: 1.0000 | ORAL_TABLET | Freq: Every evening | ORAL | Status: AC | PRN

## 2013-06-01 MED ORDER — LIDOCAINE 5 % EX PTCH
1.0000 | MEDICATED_PATCH | CUTANEOUS | Status: DC
  Administered 2013-06-01 – 2013-06-02 (×2): 1 via TRANSDERMAL
  Filled 2013-06-01 (×3): qty 1

## 2013-06-01 MED ORDER — HYDROMORPHONE BOLUS VIA INFUSION
1.0000 mg | INTRAVENOUS | Status: DC | PRN
  Administered 2013-06-01 – 2013-06-03 (×6): 1 mg via INTRAVENOUS
  Filled 2013-06-01: qty 1

## 2013-06-01 MED ORDER — LORAZEPAM 1 MG PO TABS: 1.0000 mg | ORAL_TABLET | ORAL | Status: AC | PRN

## 2013-06-01 MED ORDER — LORAZEPAM 1 MG PO TABS
1.0000 mg | ORAL_TABLET | ORAL | Status: DC | PRN
  Administered 2013-06-02: 1 mg via ORAL
  Filled 2013-06-01: qty 1

## 2013-06-01 MED ORDER — SODIUM CHLORIDE 0.9 % IV SOLN: INTRAVENOUS | Status: AC

## 2013-06-01 MED ORDER — SODIUM CHLORIDE 0.9 % IV SOLN
1.0000 mg/h | INTRAVENOUS | Status: DC
  Administered 2013-06-01 – 2013-06-02 (×2): 0.5 mg/h via INTRAVENOUS
  Administered 2013-06-03: 1 mg/h via INTRAVENOUS
  Filled 2013-06-01 (×3): qty 2.5

## 2013-06-01 MED ORDER — LIDOCAINE 5 % EX PTCH: 1.0000 | MEDICATED_PATCH | CUTANEOUS | Status: AC

## 2013-06-01 NOTE — Progress Notes (Signed)
Notified patient and family request services of [facility] ([facility]) after discharge.  Spoke with patient, wife Joycelyn Schmid and daughter Jonelle Sidle at bedside to  initiate education related to hospice services, philosophy and team approach to care- pt/family voiced good understanding of information provided.  Per discussion with pt, primary attending plan is to d/c by non-emergent transport once arrangements are in place -Please send completed GOLD DNR form home with pt  -Per notes/discussion- plan is to transition pt to Dilaudid IV per PMT Dr Hilma Favors today (oral Dilaudid to be stopped)  - in preparation for d/c home on IV Dilaudid via [facility] - [facility] will need hard copy of Rx - home infusion supplies will need to be arranged through [facility] to be delivered to [facility] so the infusion can be transitioned tomorrow, Tuesday for home   DME needs discussed; currently pt has an electric hospital bed BSC, wheelchair and O2 in the home; family would like an over bed table and tub seat arranged to be sent to the home  Dr Marin Olp will be hospice attending physician; pharmacy is [facility]  Initial paperwork faxed to [facility]  Completed d/c summary will need to be faxed to [place] @ [phone] when final Please notify [facility] when patient is ready to leave unit at d/c call [phone] (or [phone] if after 5 pm);  [facility] information and contact numbers also given to wife Joycelyn Schmid during visit.   Above information shared with [facility] Please call with any questions or concerns   Danton Sewer, RN 06/01/2013, 9:17 AM [facility] Liaison [phone]

## 2013-06-01 NOTE — Progress Notes (Signed)
Received a call from [place], [place] with [place]. Pt is requesting their services. Dr. Marin Olp has agreed this is necessary and has agreed to be attending with [place] MD to assess and treat for symptom management.

## 2013-06-01 NOTE — Progress Notes (Signed)
Patient premedicated with 1mg  Dilaudid prior to drainage of Pleurex cath. Drained 400cc bloody fluid from pleurex cath before patient started to feel discomfort. Patient tolerated the procedure well. Will continue to monitor.

## 2013-06-01 NOTE — Discharge Summary (Signed)
Physician Discharge Summary  Jimmy Christensen UUE:[id number] DOB: 1956-03-29 DOA: 05/25/2013  PCP: Jimmy Simmer, MD  Admit date: 05/19/2013 Discharge date: 06/02/2013 once home hospice equipment set up for PCA  Recommendations for Outpatient Follow-up:  1. Dr. Marin Olp to follow.  Will discuss possible clinical trials with him at next visit 2. Home hospice arranged with home dilaudid PCA through port for pain control.   3. PCP as needed  Discharge Diagnoses:  Principal Problem:   Pancreatic cancer with malignant pleural effusions and ascites Active Problems:   CAD (coronary artery disease)   Hyponatremia   Diabetes mellitus type 2 in nonobese   Protein-calorie malnutrition, severe   Pulmonary embolus   Anemia of chronic disease   Pleuritic chest pain   Weakness generalized   Pleural effusion, malignant   Pleural effusion on right   Discharge Condition:  fair  Diet recommendation: regular  Wt Readings from Last 3 Encounters:  05/31/13 83.553 kg (184 lb 3.2 oz)  05/15/13 89.3 kg (196 lb 13.9 oz)  05/07/13 90.1 kg (198 lb 10.2 oz)    History of present illness:  58 year old male with past medical hx CAD, hx of MI with DES in 2006, DM, pancreatic cancer diagnosed in fall 2014 with malignant pleural effusion and ascites status post right-sided pleur-x catheter placement in January with further hospitalization for trapped lung due to malignant pleural effusion followed by pneumothorax and pulmonary embolism who was discharged on Lovenox was brought into the hospital by his wife after he complained of severe right-sided chest pain at the pleur-x catheter site. Patient reports her pain to be sharp and stabbing in nature and radiating to the right mid back. For past one week his wife has been noticing increased pleural fluid drainage from the catheter of about 750 cc alternating with 400 cc every other day. The wife was instructed for daily pleurx drainage till output <542ml then change to every  other day.   Patient has been undergoing treatment with chemotherapy at [facility] in [place] and has had 2 chemotherapy treatments with Folforinox so far. He was supposed to go back to [place] 2 weeks back but since she has been increasingly weak he has not been able to go there. Patient denies headache, dizziness, fever, chills, nausea , vomiting, chest pain, palpitations, increased SOB, worsened abdominal pain, bowel or urinary symptoms. He has very poor appetite , taking mostly Ensure and is increasingly weak. Denies any chest trauma. Patient has been following with a pulmonologist at [facility] since his recent discharge. Since patient could not go to [place] for chemotherapy adue to weakness, he was in the process of establishing a care with oncologist in [place]. He was seen by Dr. [name] in december.   Hospital Course:   Pancreatic cancer with malignant pleural effusions and ascites, Patient initially was treated at [facility] in [place], however, he has been too ill to return there for ongoing chemotherapy.  Dr. Marin Olp consulted to establish care here in [facility].  He recommended no further chemotherapy but he is looking into clinical trials.  He does not think [name] is a candidate for clinical trials at this time, however.  Trials will be discussed with him at his next appointment.  Patient and family met with Dr. Hilma Favors from Palliative care who addressed goals of care and comfort, particularly pain management.  He is going to transition from home health services to home hospice.  Family is supportive.  He is DNR.    Bilateral  malignant pleural effusions with right pleurex catheter:  He was hospitalized with pain during drainage of his pleurex catheter and bloody effusion.  His hemoglobin was decreased from baseline, however, it remained stable around 9.$RemoveBeforeD'4mg'WxybgMdYivWgLc$ /dl and he did not receive blood transfusions.  His aspirin was discontinued and his lovenox dose was reduced from full  treatment dose for PE to DVT prophylaxis dose to minimize bleeding risk.  See conversation details below.  Pulmonology recommended draining the effusion only as needed, such as every 4th day or for symptoms of shortness of breath to minimize discomfort.  Continue home oxygen.    Right sided Pleuritic chest pain, back pain, abdominal pain, persistent.  Palliative care recommended discontinuing oral medications and transitioning to dilaudid PCA to continue at home with Dr. Marin Olp supervising hospice care.  Lidocaine patch being tried also.    Pulmonary embolus "Possible small pulmonary embolism identified in a left lower lobe posterior basilar segmental branch" CT scan 1/26.  Duplex LE negative for DVT during this admission.   - Discussed treatment options with family:  -----------1. Continue full a/c which could lead to progressive hemorrhage, anemia, increased SOB, but would cont to treat PE and prevent DVT.  -----------2. DVT proph dose lovenox to prevent more blood clots, but would not adequately treat PE, probably decreased risk of hemorrhage.  -----------3. No A/C. Risk of PE and DVT, least risk of hemorrhage. GIven metastatic pancreatitic ca, high risk for DVT/PE.  Family decided to pursue DVT proph dose lovenox.   CAD (coronary artery disease) s/p DES 2006 stable.  Aspirin held due to bleeding.  Continued statin and metoprolol.  Discontinued ACEI due to low blood pressures and his blood pressures improved.  If concerns for liver failure, d/c statin.  Continued BB due to improved BP after holding ACEI and persistent tachycardia.    Hyponatremia due to malignancy, stable and asymptomatic.     Diabetes mellitus CBG well controlled, A1c 8 last month.  Initially placed on SSI, but this was discontinued due to low normal CBG.  Lantus and metformin have been discontinued.    Protein-calorie malnutrition, severe.  Continued nutrition supplements.   Anemia of chronic disease hgb decreased from  baseline but stable.   Weakness generalized.  Home hospice arranged with home equipment.  Constipation due to narcotics.  Resolved with bisacodyl after which he had diarrhea.  Recommended that he use less stool softener medication and rely on more stimulant medication.  May adjust doses as needed to comfort.     Consultants:  Oncology, Dr. Marin Olp  Pulmonology Procedures:  CXR Antibiotics:  none    Discharge Exam: Filed Vitals:   06/01/13 1447  BP: 120/63  Pulse: 109  Temp: 99.3 F (37.4 C)  Resp: 14   Filed Vitals:   05/31/13 2040 06/01/13 0402 06/01/13 1017 06/01/13 1447  BP: 103/59 117/71  120/63  Pulse: 102 93  109  Temp: 98.6 F (37 C) 98.1 F (36.7 C)  99.3 F (37.4 C)  TempSrc: Oral Oral  Oral  Resp: $Remo'16 16  14  'xwdKs$ Height:      Weight:      SpO2: 98% 100% 99% 97%    General: Cachectic CM, No acute distress, nasal canula in place, unchanged from prior HEENT: NCAT, MMM  Cardiovascular: RRR, nl S1, S2 no mrg, 2+ pulses, warm extremities  Respiratory: Diminished bilateral bases and to the mid-back on the right. Some BS heard at right apex but still less than left side, no increased WOB, stable from  yesterday  Abdomen: NABS, soft, NT/ND, palpable mass left abdomen  MSK: Normal tone and bulk, 2+ RLE and 1+ LLE edema  Neuro: Grossly intact   Discharge Instructions      Discharge Orders   Future Orders Complete By Expires   Call MD for:  difficulty breathing, headache or visual disturbances  As directed    Call MD for:  extreme fatigue  As directed    Call MD for:  hives  As directed    Call MD for:  persistant dizziness or light-headedness  As directed    Call MD for:  persistant nausea and vomiting  As directed    Call MD for:  severe uncontrolled pain  As directed    Call MD for:  temperature >100.4  As directed    Diet general  As directed    Discharge instructions  As directed    Comments:     You were hospitalized with pain during draining of your  pleurex catheter and drainage of bloody fluid.  Most likely, you have some cancer cells that have been irritated by the pleurex catheter causing some bleeding.  The pulmonologist recommend that you drain your catheter only as needed or every 4 days.  Lovenox increases the risk of bleeding.  Your dose has been decreased to $RemoveBefo'40mg'cxCmBWfMxsX$  once daily which should reduce the risk of developing new blood clots and reduce the risk of worsening bleeding from around your lung.  Please also stop your aspirin and mobic because these also increase the risk of bleeding.  Because your blood pressure was somewhat low, I have stopped your ramipril.  You may also stop your metformin and your lantus because your blood sugars have been more normal.  You may also stop your niacin because this medication does not help you live any longer and it has some side effects like flushing.  For pain control, please stop your oral pain medications.  You will be using IV dilaudid instead through your port.  The hospice nurses and Dr. Marin Olp can help adjust the doses if needed.   Driving Restrictions  As directed    Comments:     No driving or operating heavy machinery.   Increase activity slowly  As directed        Medication List    STOP taking these medications       aspirin 81 MG EC tablet     HYDROmorphone 2 MG tablet  Commonly known as:  DILAUDID     insulin glargine 100 UNIT/ML injection  Commonly known as:  LANTUS     meloxicam 7.5 MG tablet  Commonly known as:  MOBIC     metFORMIN 500 MG tablet  Commonly known as:  GLUCOPHAGE     morphine 15 MG 12 hr tablet  Commonly known as:  MS CONTIN     niacin 1000 MG CR tablet  Commonly known as:  NIASPAN     PRESCRIPTION MEDICATION     ramipril 2.5 MG capsule  Commonly known as:  ALTACE      TAKE these medications       albuterol 108 (90 BASE) MCG/ACT inhaler  Commonly known as:  PROVENTIL HFA;VENTOLIN HFA  Inhale 1-2 puffs into the lungs every 6 (six) hours as  needed for wheezing or shortness of breath.     atorvastatin 40 MG tablet  Commonly known as:  LIPITOR  Take 1 tablet (40 mg total) by mouth daily.     dronabinol 5 MG  capsule  Commonly known as:  MARINOL  Take 1 capsule (5 mg total) by mouth 2 (two) times daily before lunch and supper.     DULoxetine 60 MG capsule  Commonly known as:  CYMBALTA  Take 60 mg by mouth daily.     enoxaparin 40 MG/0.4ML injection  Commonly known as:  LOVENOX  Inject 0.4 mLs (40 mg total) into the skin daily.     feeding supplement (ENSURE COMPLETE) Liqd  Take 237 mLs by mouth 3 (three) times daily between meals.     fluticasone 110 MCG/ACT inhaler  Commonly known as:  FLOVENT HFA  Inhale 2 puffs into the lungs 2 (two) times daily.     fluticasone 50 MCG/ACT nasal spray  Commonly known as:  FLONASE  Place 1 spray into both nostrils daily as needed.     hydrocortisone 2.5 % rectal cream  Commonly known as:  ANUSOL-HC  Place rectally 2 (two) times daily as needed for hemorrhoids or itching.     lactulose 10 GM/15ML solution  Commonly known as:  CHRONULAC  Take 15 mLs (10 g total) by mouth daily as needed for mild constipation or moderate constipation.     lidocaine 5 %  Commonly known as:  LIDODERM  Place 1 patch onto the skin daily. Remove & Discard patch within 12 hours or as directed by MD     lidocaine-prilocaine cream  Commonly known as:  EMLA  Apply 1 application topically once.     metoprolol succinate 25 MG 24 hr tablet  Commonly known as:  TOPROL-XL  Take 1 tablet (25 mg total) by mouth daily.     nitroGLYCERIN 0.4 MG SL tablet  Commonly known as:  NITROSTAT  Place 1 tablet (0.4 mg total) under the tongue every 5 (five) minutes as needed. May repeat up to 3 doses.     ondansetron 4 MG tablet  Commonly known as:  ZOFRAN  Take 4 mg by mouth every 8 (eight) hours as needed for nausea or vomiting.     pantoprazole 40 MG tablet  Commonly known as:  PROTONIX  Take 40 mg by mouth  2 (two) times daily.     prochlorperazine 5 MG tablet  Commonly known as:  COMPAZINE  Take 5 mg by mouth every 6 (six) hours as needed for nausea or vomiting.     protein supplement 6 g Powd  Commonly known as:  RESOURCE BENEPROTEIN  Take 1 scoop (6 g total) by mouth 3 (three) times daily with meals.     senna 8.6 MG Tabs tablet  Commonly known as:  SENOKOT  Take 1-2 tablets (8.6-17.2 mg total) by mouth at bedtime as needed for mild constipation.     sodium chloride 0.65 % Soln nasal spray  Commonly known as:  OCEAN  Place 2 sprays into both nostrils at bedtime as needed for congestion.     sodium chloride 0.9 % SOLN 47.5 mL with HYDROmorphone 10 MG/ML SOLN 25 mg  - Infuse 0.$RemoveBe'5mg'MSLFmeByn$ /hr continuous infusion via CADD pump with $RemoveB'1mg'sPdOUVLW$  bolus doses q1 hour via PCA.  - [place] to determine concentration. Hospice may titrate as needed for pain.     VITAMIN D-3 PO  Take 1 capsule by mouth daily.     Zinc 10 MG Lozg  Use as directed 10 mg in the mouth or throat daily.       Follow-up Information   Follow up with Jimmy Simmer, MD. (As needed)    Specialty:  Family Medicine   Contact information:   [address] [place] [address] [phone]       Follow up with Volanda Napoleon, MD In 2 weeks.   Specialty:  Oncology   Contact information:   [address], [address] [place] [address] [phone]        The results of significant diagnostics from this hospitalization (including imaging, microbiology, ancillary and laboratory) are listed below for reference.    Significant Diagnostic Studies: Dg Chest 1 View  05/04/2013   CLINICAL DATA:  Evaluate pneumothorax, shortness of breath and chest pain  EXAM: CHEST - 1 VIEW  COMPARISON:  Earlier same day; CT abdomen pelvis - earlier same day; 03/19/2013  FINDINGS: Grossly unchanged cardiac silhouette and mediastinal contours. Stable positioning of support apparatus. There has been minimal  re-expansion of the right lung with persistent small right-sided hydro pneumothorax. No significant deviation of the cardiomediastinal structures. Possible minimal increase in the amount of right-sided pleural fluid. Small amount of left-sided pleural fluid appear grossly unchanged. Improved aeration of the right lower lung with persistent bibasilar heterogeneous opacities, right greater than left. No new focal airspace opacities. No definite evidence of edema. Grossly unchanged bones.  IMPRESSION: 1. Minimal re-expansion of the right lung with persistent small right-sided hydro pneumothorax. Given the presence of a right basilar approach chest tube, this pneumothorax may be ex vacuo in etiology. Clinical correlation is advised. 2. Unchanged small left-sided pleural effusion. 3. Improved aeration of the right lung with persistent bibasilar opacities, right greater than left, atelectasis versus infiltrate.   Electronically Signed   By: Sandi Mariscal M.D.   On: 05/04/2013 14:55   Dg Chest 2 View  06/10/2013   CLINICAL DATA:  Increasing shortness of Breath  EXAM: CHEST  2 VIEW  COMPARISON:  05/15/2013  FINDINGS: Right-sided chest wall port and right PleurX catheter are again identified. There remains partial collapse of the right lung but stable from the prior exam. The pleural fluid has increased slightly on the right hand side. A left pleural effusion is noted which is increased in the interval from the prior study. Patchy interstitial changes are again noted within the left lung.  IMPRESSION: Increased bilateral pleural effusions.  Stable partial collapse of the right lung.   Electronically Signed   By: Inez Catalina M.D.   On: 05/26/2013 19:24   Dg Chest 2 View  05/15/2013   CLINICAL DATA:  Chest tube  EXAM: CHEST  2 VIEW  COMPARISON:  DG CHEST 1V PORT dated 05/14/2013; CT ANGIO CHEST W/CM &/OR WO/CM dated 05/11/2013  FINDINGS: There is a right basilar chest tube present. There is interval enlargement of the right  hydro pneumothorax. There is right lower lobe collapse. There is a small left pleural effusion. There is mild bilateral interstitial thickening. Stable cardiomediastinal silhouette. Right-sided Port-A-Cath in unchanged position. Unremarkable osseous structures.  Nonspecific gastric air-fluid level.  IMPRESSION: Interval enlargement of the right hydropneumothorax.  Right lower lobe collapse.  Findings concerning for mild underlying pulmonary edema.   Electronically Signed   By: Kathreen Devoid   On: 05/15/2013 14:00   Dg Chest 2 View  05/11/2013   CLINICAL DATA:  Cough and respiratory distress  EXAM: CHEST  2 VIEW  COMPARISON:  DG CHEST 2V dated 05/11/2013  FINDINGS: The lungs are reasonably well inflated. There is a stable moderate to large hydropneumothorax on the right. The pleural drainage catheter appears unchanged in position. There is parenchymal density in  the right infrahilar region which is stable. There is a small left pleural effusion which appears stable. The cardiac silhouette is normal in size. The Port-A-Cath appliance tip lies in the region of the midportion of the SVC.  IMPRESSION: 1. The right hydropneumothorax on the right is unchanged and likely occupies at least 50-60% of the lung volume. The right-sided small caliber pleural drainage tube is stable in position. 2. A small left pleural effusion is not significantly changed from the previous study.   Electronically Signed   By: David  Martinique   On: 05/11/2013 14:34   Dg Chest 2 View  05/07/2013   CLINICAL DATA:  Shortness of breath, dyspnea  EXAM: CHEST  2 VIEW  COMPARISON:  05/06/2013; 05/05/2013; 05/04/2013  FINDINGS: Grossly unchanged cardiac silhouette and mediastinal contours. Stable positioning of support apparatus. Interval increase in now moderate amount of fluid within right-sided hydropneumothorax. The amount of air within the right pleural space is grossly unchanged. Stable positioning of right-sided chest tube.  Grossly unchanged  small to moderate size left-sided effusion. Improved aeration of the bilateral lungs with persistent bibasilar opacities. No definite evidence of edema. Grossly unchanged bones.  IMPRESSION: 1. Stable positioning of support apparatus with interval increase in the now moderate amount of fluid within the right-sided possibly ex vacuo hydropneumothorax. 2. Grossly unchanged small to moderate size left-sided pleural effusion. 3. Improved aeration of lungs with persistent bibasilar heterogeneous/consolidative opacities, likely atelectasis.   Electronically Signed   By: Sandi Mariscal M.D.   On: 05/07/2013 10:12   Ct Angio Chest Pe W/cm &/or Wo Cm  05/11/2013   CLINICAL DATA:  Worsening shortness of breath and history of pancreatic carcinoma with history of bilateral malignant pleural effusions and lack of expansion of the right lung.  EXAM: CT ANGIOGRAPHY CHEST WITH CONTRAST  TECHNIQUE: Multidetector CT imaging of the chest was performed using the standard protocol during bolus administration of intravenous contrast. Multiplanar CT image reconstructions including MIPs were obtained to evaluate the vascular anatomy.  CONTRAST:  118mL OMNIPAQUE IOHEXOL 350 MG/ML SOLN  COMPARISON:  Multiple recent chest x-ray studies with the latest dated 05/11/2013.  FINDINGS: Pulmonary arterial opacification is adequate. There is suggestion of potentially a small pulmonary embolism to the posterior basilar segmental artery of the left lower lobe. No other emboli are identified.  There remains evidence of trapped lung on the right with very little aerated lung present and a persistent high did pneumothorax identified. An indwelling tunneled pleural drainage catheter extends into the posterior and inferior pleural space with a small amount of pleural fluid present currently.  There is a moderate left-sided pleural effusion demonstrating partial loculation and extending up into the major fissure. A moderate amount of ascites is also present  in the left upper quadrant adjacent to the spleen.  Two separate left lower lobe lung nodules are identified measuring approximately 0.9 cm and 1.1 cm in diameter. No enlarged lymph nodes are seen. A porta cath is present.  Review of the MIP images confirms the above findings.  IMPRESSION: 1. Possible small pulmonary embolism identified in a left lower lobe posterior basilar segmental branch. 2. Persistent "trapped" appearance of the right lung with lack of expansion despite previous pleural drain placement. There is a large residual hydropneumothorax.   Electronically Signed   By: Aletta Edouard M.D.   On: 05/11/2013 15:35   Ct Abdomen Pelvis W Contrast  05/04/2013   CLINICAL DATA:  Abdominal pain. Fever. Chills. Undergoing chemotherapy for pancreatic carcinoma.  EXAM:  CT ABDOMEN AND PELVIS WITH CONTRAST  TECHNIQUE: Multidetector CT imaging of the abdomen and pelvis was performed using the standard protocol following bolus administration of intravenous contrast.  CONTRAST:  157mL OMNIPAQUE IOHEXOL 300 MG/ML  SOLN  COMPARISON:  03/19/2013  FINDINGS: Right chest tube is now seen in place with a right basilar hydropneumothorax. Collapse or consolidation is seen in the right lower lung. New small to moderate left pleural effusion noted.  New moderate ascites is seen. Peritoneal enhancement is also noted, and omental caking is again demonstrated, consistent with peritoneal carcinomatosis. No evidence of bowel obstruction.  Hypovascular mass in the pancreatic body is again seen which currently measures 4.1 x 3.8 cm on image 69 this is not significant change since previous study.  Probable tiny sub-cm hepatic cysts are stable. No definite liver masses are identified. The spleen, and adrenal glands are normal in appearance. Tiny renal cyst remains stable and there is no evidence of renal masses or hydronephrosis. No lymphadenopathy identified no evidence focal inflammatory process or bowel obstruction. No suspicious  bone lesions identified.  IMPRESSION: New ascites and findings consistent with increased peritoneal carcinomatosis.  Large right basilar hydropneumothorax with right chest tube in place.  Collapse or consolidation in right lower lung. Infection or neoplasm cannot be excluded.  New small to moderate left pleural effusion.  Stable 4 cm hypovascular mass the pancreatic body, consistent with known primary pancreatic carcinoma.   Electronically Signed   By: Earle Gell M.D.   On: 05/04/2013 14:52   US Abdomen Limited  05/05/2013   CLINICAL DATA:  Patient with recently diagnosed pancreatic cancer with possible metastatic disease. Abdominal pain. Ascites noted on CT of the abdomen  EXAM: US ABDOMEN LIMITED - RIGHT UPPER QUADRANT  COMPARISON:  CT of the abdomen on 05/04/2013.  FINDINGS: Limited ultrasound of the abdomen findings small volume of ascites in the perihepatic and perisplenic regions. These areas are not amenable for percutaneous paracentesis. There is also a potential collection of fluid in the low pelvis note is very difficult to differentiate from fluid versus fluid-filled intestine. It was felt that this was not safe for percutaneous paracentesis.  IMPRESSION: Small volume perihepatic, perisplenic ascites not amenable for paracentesis. PROCEDURE not performed.  Read by: Ascencion Dike PA-C   Electronically Signed   By: Maryclare Bean M.D.   On: 05/05/2013 15:55   Dg Chest Port 1 View  05/29/2013   CLINICAL DATA:  Evaluate effusions  EXAM: PORTABLE CHEST - 1 VIEW  COMPARISON:  05/29/2013  FINDINGS: A right chest wall porta catheter is appreciated the and internal jugular approach with tip projecting region of superior vena caval right atrial junction. Stable partial collapse of the right lung is appreciated. There is been increased size of the patient's right pleural effusion. A chest tube is appreciated within the right lung base. The left pleural effusion appears stable. Patchy infiltrates identified within  the left hemi thorax with increased confluence within the lateral upper lobe region. The cardiac silhouette is enlarged. The osseous structures are unremarkable.  IMPRESSION: Hydro pneumothorax on the right with increased size of the pleural effusion component. The apical pneumothorax and lung collapse on the right appear stable.  There is increased conspicuity of the interstitial infiltrate in the left hemi thorax likely reflecting a component of pulmonary edema. A focal area of atelectasis focal versus focal component is identified within the lateral aspect of the left upper hemi thorax. The left pleural effusion appears stable. Continued surveillance evaluation is recommended.  Electronically Signed   By: Margaree Mackintosh M.D.   On: 05/29/2013 12:26   Dg Chest Port 1 View  05/14/2013   CLINICAL DATA:  Right pneumothorax.  EXAM: PORTABLE CHEST - 1 VIEW  COMPARISON:  05/13/2013 and 05/11/2013  FINDINGS: Power port in place. Right pneumothorax is unchanged. PleurX catheter present at the right lung base. Persistent chronic atelectasis at the right lung base.  No change in the left pleural effusion.  IMPRESSION: No change since the prior study. Persistent right pneumothorax and left pleural effusion.   Electronically Signed   By: Rozetta Nunnery M.D.   On: 05/14/2013 08:04   Dg Chest Port 1 View  05/13/2013   CLINICAL DATA:  Follow-up of pneumothorax  EXAM: PORTABLE CHEST - 1 VIEW  COMPARISON:  CT ANGIO CHEST W/CM &/OR WO/CM dated 05/11/2013; DG CHEST 2 VIEW dated 05/11/2013  FINDINGS: The patient has a known right-sided hydro pneumothorax. The pneumothorax has slightly decreased in size since the previous study but still likely occupies the least 30-40% of the lung volume. The small caliber right-sided chest tube tip is stable in position with the tip projected inferiorly in the medial aspect of the hemithorax. Density at the right lung base is consistent with known pleural fluid and there is consolidated lung  parenchyma in the right infrahilar region which is stable.  The interstitial markings on the left have become more conspicuous since the earlier study. A portion of this may be related fluid in the major fissure. There is a persistent moderate size left pleural effusion. The cardiac silhouette is normal in size. The Port-A-Cath appliance tip lies in the region of the mid to distal SVC.  IMPRESSION: 1. The known large right hydropneumothorax has slightly decreased in size but still occupies at least 40-50% of the lung volume. The position of the right-sided chest tube is unchanged. 2. The pulmonary interstitial markings within the left lung are more conspicuous today. The left-sided pleural effusion is unchanged. 3. There is no evidence of CHF.   Electronically Signed   By: David  Martinique   On: 05/13/2013 19:08   Dg Chest Port 1 View  05/06/2013   CLINICAL DATA:  Pneumothorax.  EXAM: PORTABLE CHEST - 1 VIEW  COMPARISON:  05/05/2013.  FINDINGS: Port-A-Cath in stable anatomic position. Persistent prominent right pneumothorax. Patient's physician has previously been notified of this finding. Chest tube noted over right chest in stable position. Heart size is stable. Pulmonary infiltrate on the left has improved. Left-sided pleural effusion has increased in size. No acute osseous abnormality.  IMPRESSION: 1. Persistent right pneumothorax. Chest tube in stable position. Patient's physician has been previously notified of this finding. 2. Partial clearing of left pulmonary infiltrate. Developing left pleural effusion.   Electronically Signed   By: Marcello Moores  Register   On: 05/06/2013 07:26   Dg Chest Port 1 View  05/05/2013   CLINICAL DATA:  Followup pneumothorax.  EXAM: PORTABLE CHEST - 1 VIEW  COMPARISON:  05/04/2013  FINDINGS: Right IJ central venous catheter is unchanged. Small caliber right-sided chest tube has tip over the medial right base unchanged. There is continued evidence of a moderate to large right-sided  pneumothorax which has worsened compared to the recent prior exam from 05/04/2013 at 1443 although is not significantly changed from the prior exam 05/04/2013 11:28 a.m. There is continued hazy opacification over the right lower thorax likely layering pleural fluid slightly improved. There is mild worsening left perihilar and basilar opacification which may be due to  interstitial edema. There is continued moderate consolidation over the inferior aspect of the collapsed right lung unchanged. Cardiomediastinal silhouette and remainder of the exam is unchanged.  IMPRESSION: Interval worsening of patient's moderate to large right-sided pneumothorax with small caliber chest tube in place and unchanged with tip over the medial right base.  Slightly improved right pleural fluid collection. Stable consolidation over the inferior aspect of the collapse right lung. Worsening left perihilar/ basilar opacification suggesting interstitial edema.  These results were called by telephone at the time of interpretation on 05/05/2013 at 7:31 AM to Dr. Janece Canterbury , who verbally acknowledged these results.   Electronically Signed   By: Marin Olp M.D.   On: 05/05/2013 07:31   Dg Chest Port 1 View  05/04/2013   CLINICAL DATA:  Shortness of breath.  EXAM: PORTABLE CHEST - 1 VIEW  COMPARISON:  None.  FINDINGS: Right chest tube is noted of the right lower chest. A right pneumothorax is present. The pneumothorax is prominent and may be under tension. Atelectasis and effusion on the right noted. Port-A-Cath noted in good anatomic position. Small left pleural effusion. Mild left base atelectasis. Heart size normal.  IMPRESSION: 1. Prominent right pneumothorax with with probable tension. Right chest tube is noted over the right chest. 2. Bibasilar atelectasis with bilateral pleural effusions. Critical Value/emergent results were called by telephone at the time of interpretation on 05/04/2013 at 11:50 AM to Dr. Joseph Berkshire , who  verbally acknowledged these results.   Electronically Signed   By: Marcello Moores  Register   On: 05/04/2013 11:50    Microbiology: Recent Results (from the past 240 hour(s))  CLOSTRIDIUM DIFFICILE BY PCR     Status: None   Collection Time    05/31/13 10:43 AM      Result Value Ref Range Status   C difficile by pcr NEGATIVE  NEGATIVE Final   Comment: Performed at [facility]: Basic Metabolic Panel:  Recent Labs Lab 06/07/2013 2000 05/29/13 1320 05/30/13 0600  NA 133* 136* 133*  K 3.9 4.1 3.9  CL 94* 98 95*  CO2 $Re'27 29 28  'pek$ GLUCOSE 113* 146* 97  BUN $Re'11 10 9  'tID$ CREATININE 0.34* 0.43* 0.36*  CALCIUM 8.2* 8.2* 8.2*   Liver Function Tests:  Recent Labs Lab 06/02/2013 2000  AST 34  ALT 33  ALKPHOS 97  BILITOT 0.3  PROT 5.6*  ALBUMIN 1.9*    Recent Labs Lab 06/13/2013 2000  LIPASE 7*   No results found for this basename: AMMONIA,  in the last 168 hours CBC:  Recent Labs Lab 05/23/2013 2000 05/29/13 1320 05/30/13 0600 05/31/13 0544  WBC 21.8* 17.8* 18.7* 17.8*  NEUTROABS 19.0*  --   --   --   HGB 9.7* 10.1* 9.4* 9.4*  HCT 29.8* 31.0* 28.8* 28.2*  MCV 84.7 86.6 85.5 85.2  PLT 355 321 328 327   Cardiac Enzymes: No results found for this basename: CKTOTAL, CKMB, CKMBINDEX, TROPONINI,  in the last 168 hours BNP: BNP (last 3 results)  Recent Labs  05/04/13 1135  PROBNP 542.0*   CBG:  Recent Labs Lab 05/31/13 1201 05/31/13 1657 05/31/13 2241 06/01/13 0711 06/01/13 1152  GLUCAP 132* 151* 120* 107* 128*    Time coordinating discharge: 45 minutes  Signed:  Nick Stults  [facility] 06/01/2013, 3:45 PM

## 2013-06-01 NOTE — Progress Notes (Addendum)
S: Complains of intermittent intense rib pain/pleural pain. He is agreeable in general to his care plan at this point-accepting but also grieving.  Vitals signs reviewed and noted.   Severely cachectic. Alert and oriented. Minimal breath sounds on the right. Course on left. Abdomen distended. Skin without lesions.  Assessment: 58 yo man with metastatic pancreatic cancer and difficult to manage malignant pleural effusions and cancer related pain. He is transitioning to Hospice after a year an a half battle with progressive disease. He is requiring more frequent pleurx drainage and increasing amounts of pain prns.I am concerned that he will soon not be able to take PO and am recommending to transition him to IV infusion dilaudid for optimal pain management.   Plan is for him to go home with hospice tomorrow. Will transition to Dilaudid infusion- continuous at 0.5mg /hr with q1 1mg  boluses via home PCA/CADD -will stop scheduled oral hydromorphone and MSContin. Script printed and faxed to [facility]. D/C with hospice tomorrow.  25 minutes. Greater than 50%  of this time was spent counseling and coordinating care related to the above assessment and plan.   Lane Hacker, DO Palliative Medicine

## 2013-06-01 NOTE — Consult Note (Signed)
Name: Jimmy Christensen MRN: [id number] DOB: 1956/02/14    ADMISSION DATE:  June 26, 2013 CONSULTATION DATE:  05/29/13  REFERRING MD :  Dr. Sheran Fava PRIMARY SERVICE:  [facility]  CHIEF COMPLAINT:  Pleural Effusion   BRIEF PATIENT DESCRIPTION: 58 y/o M with pancreatic cancer and known malignant pleural effusions admitted 2/12 with R sided chest pain and increased pleural drainage.    SIGNIFICANT EVENTS / STUDIES:  2014 - Dx of malignant pleural effusion in setting of pancreatic cancer 03/2013 - seen by Dr [name] 04/2013 - hospitalized for pleurex placement, trapped lung with malignant effusion, PE.  Dc'd on lovenox.  Rx in [place] for cancer trmt's (Folforinox x2 trmt's) ........................................................................................................................................................................ 2/12 - Admit with right sided chest pain, increased pleural drainage from pleurx, increased weakness   LINES / TUBES: R Pleur-X >>>  CULTURES:   ANTIBIOTICS:   SUBJECTIVE:  Pt complains of R posterior / side back pain.  Mild SOB.   VITAL SIGNS: Temp:  [98.1 F (36.7 C)-98.6 F (37 C)] 98.1 F (36.7 C) (02/16 0402) Pulse Rate:  [93-105] 93 (02/16 0402) Resp:  [16] 16 (02/16 0402) BP: (103-117)/(59-71) 117/71 mmHg (02/16 0402) SpO2:  [95 %-100 %] 99 % (02/16 1017) Weight:  [184 lb 3.2 oz (83.553 kg)] 184 lb 3.2 oz (83.553 kg) (02/15 1316)  PHYSICAL EXAMINATION: General:  Thin, chronically ill in NAD Neuro:  AAOx4, MAE, speech clear HEENT:  Mm pink/moist, no jvd Neck:  Port wire noted over clavicle Cardiovascular:  s1s2 rr, no m/r/g Lungs:  resp's even/non-labored, lungs bilaterally diminished R>L Abdomen:  Round/soft, bsx4 active Musculoskeletal:  No acute deformities Skin:  Warm/dry, no edema   Recent Labs Lab 2013/06/26 2000 05/29/13 1320 05/30/13 0600  NA 133* 136* 133*  K 3.9 4.1 3.9  CL 94* 98 95*  CO2 27 29 28   BUN 11 10 9   CREATININE 0.34* 0.43*  0.36*  GLUCOSE 113* 146* 97    Recent Labs Lab 05/29/13 1320 05/30/13 0600 05/31/13 0544  HGB 10.1* 9.4* 9.4*  HCT 31.0* 28.8* 28.2*  WBC 17.8* 18.7* 17.8*  PLT 321 328 327   No results found.  ASSESSMENT / PLAN:  Bilateral Pleural Effusions - chronic in the setting of pancreatic cancer with mets.  Now with bloody pleural drainage from pleurex cath.  PE - dx in 04/2013, on lovenox approx 2 wks. Pain - in setting of pancreatic cancer with mets   58 y/o M with pancreatic cancer with malignant effusions and ascites.  Recent dx of PE and rx with lovenox.  Now with bloody & painful drainage from Pleur-X.  This may reflect metastatic progression or catheter related irritation in pleural space in the setting of anti-coagulants.    Plan: -drainage of pleurx cath based on dyspnea relief -> post dc , drain only if dyspnea (can transition to every 4th day )  -Can try pre-med with narcotics prior to drainage.  -escalate narcotics as needed for patients comfort -family/pt decided for prophylactic dose lovenox.  No further treatment dose for PE given bleeding.  -patient and family are open to palliative pain control with ongoing aggressive cancer treatment.  He would not want CPR.   -trial lidoderm patch for R posterior mid back + warm compress -Hospice being planned  [name], NP-C [facility] Pgr: [phone] or [phone]  Independently examined pt, evaluated data & formulated above care plan with NP who scribed this note & edited by me.  PCCM to sign off   [name] V.  [phone]  06/01/2013, 11:00 AM

## 2013-06-01 NOTE — Progress Notes (Signed)
[facility] is providing the following services: Overbed table and tubseat w/ back  If patient discharges after hours, please call [phone].   Linward Headland 06/01/2013, 10:20 AM

## 2013-06-01 NOTE — Plan of Care (Signed)
Problem: Phase II Progression Outcomes Goal: Discharge plan established Pt plans to be discharged to home with Hospice on 06/01/13.  Goal: Obtain order to discontinue catheter if appropriate Variance: Not applicable     Problem: Phase III Progression Outcomes Goal: Pain controlled on oral analgesia Outcome: Adequate for Discharge Pt using MS Contin 45 mg po q 12h and Dilaudid 4mg  po q4hours with IV Dilaudid prn breakthrough pain.

## 2013-06-02 MED ORDER — ATROPINE SULFATE 1 % OP SOLN
4.0000 [drp] | OPHTHALMIC | Status: DC | PRN
  Administered 2013-06-03: 4 [drp] via SUBLINGUAL
  Filled 2013-06-02: qty 2

## 2013-06-02 MED ORDER — LORAZEPAM 2 MG/ML IJ SOLN
1.0000 mg | Freq: Once | INTRAMUSCULAR | Status: AC
  Administered 2013-06-02: 1 mg via INTRAVENOUS

## 2013-06-02 MED ORDER — BISACODYL 10 MG RE SUPP: 10.0000 mg | Freq: Every day | RECTAL | Status: DC | PRN

## 2013-06-02 MED ORDER — DOCUSATE SODIUM 283 MG RE ENEM
1.0000 | ENEMA | Freq: Every day | RECTAL | Status: DC | PRN
  Filled 2013-06-02: qty 1

## 2013-06-02 MED ORDER — LORAZEPAM 2 MG/ML IJ SOLN
INTRAMUSCULAR | Status: AC
  Filled 2013-06-02: qty 1

## 2013-06-02 MED ORDER — LORAZEPAM 2 MG/ML IJ SOLN
1.0000 mg | INTRAMUSCULAR | Status: DC | PRN
  Administered 2013-06-02 – 2013-06-03 (×3): 1 mg via INTRAVENOUS
  Filled 2013-06-02 (×3): qty 1

## 2013-06-02 MED ORDER — SCOPOLAMINE 1 MG/3DAYS TD PT72
1.0000 | MEDICATED_PATCH | TRANSDERMAL | Status: DC
  Administered 2013-06-02: 1.5 mg via TRANSDERMAL
  Filled 2013-06-02: qty 1

## 2013-06-02 NOTE — Progress Notes (Signed)
Pleurx drained early per MD request due to SOB and for comfort. 250 cc drained. Pt tolerated well. Callie Fielding RN

## 2013-06-02 NOTE — Progress Notes (Signed)
06/02/2013 Sherrin Daisy BSN RN CCM [phone] Discussed in length of stay rounds. Pt with breathing complications today. Per progress notes discharge on hold til sxs under control.

## 2013-06-02 NOTE — Progress Notes (Signed)
[facility] PROGRESS NOTE  Jimmy Christensen VZC:[id number] DOB: 06/03/1955 DOA: 06/05/2013 PCP: [name], MD  Brief Summary  58 year old male with past medical hx CAD, hx of MI with DES in 2006, DM, pancreatic cancer diagnosed in fall 2014 with malignant pleural effusion and ascites status post right-sided pleur-x catheter placement in January with further hospitalization for trapped lung due to malignant pleural effusion followed by pneumothorax and pulmonary embolism who was discharged on Lovenox was brought into the hospital by his wife after he complained of severe right-sided chest pain at the pleur-x catheter site. Patient reports her pain to be sharp and stabbing in nature and radiating to the right mid back. For past one week his wife has been noticing increased pleural fluid drainage from the catheter of about 750 cc alternating with 400 cc every other day. The wife was instructed for daily pleurx drainage till output <574ml then change to every other day.  Last effusion drained was very bloody and after the fluid had settled in the container, the bottom half appeared to be blood and the top half looked like his previously normal fluid.    Jimmy Christensen was seen by Dr. Marin Olp, Oncology, and by Pulmonology.  He was not a candidate for further chemotherapy given his poor nutritional and functional status.  He continues to drain bloody malignant pleural fluid and each time he has the fluid drained, he has severe pain of the right chest.  His lovenox dose was decreased from therapeutic dosing to DVT proph dosing in an attempt to decrease the bleeding.  He was seen by palliative care to address his pain and shortness of breath. He was transitioned from oral pain medications to Dilaudid PCA, and the initial plan had been to discharge him to home with home hospice. On the morning of February 17, Jimmy Christensen developed worsening shortness of breath, and when I walked in the room, he was tripod breathing with  moderate to severe respiratory distress, sweating from effort.  He was given IV Ativan and IV Dilaudid and his Pleurx catheter was drained, relieving approximately 250 mL's of fluid. He temporarily had some improvement in his dyspnea, however, over the course of early afternoon, he developed agitation, worsening dyspnea with grunting and confusion.  The Dilaudid infusion has been increased for symptom management.  Not a candidate for GIP due to insurance.  Anticipate possible in-hospital death.  Palliative care continuing to follow.     Assessment/Plan  Pancreatic cancer with malignant pleural effusions and ascites, Patient initially was treated at [facility] in [place], however, he has been too ill to return there for ongoing chemotherapy. Dr. Marin Olp consulted to establish care here in [facility]. He recommended no further chemotherapy.  Patient and family met with Dr. Hilma Favors from Palliative care who addressed goals of care and comfort, particularly pain management. Family is supportive. He is DNR and full palliation at this time.    Bilateral malignant pleural effusions with right pleurex catheter: He was hospitalized with pain during drainage of his pleurex catheter and bloody effusion. His hemoglobin was decreased from baseline, however, it remained stable around 9.$RemoveBeforeD'4mg'zqphcjiEigQTLH$ /dl and he did not receive blood transfusions. His aspirin was discontinued and his lovenox dose was reduced from full treatment dose for PE to DVT prophylaxis dose to minimize bleeding risk, however, this was discontinued altogether on 2/17 due to sudden decline. Draining catheter as needed for SOB up to once daily.   -  Dilaudid PCA for dyspnea.   -  Ativan IV prn restlessness, anxiety  Right sided Pleuritic chest pain, back pain, abdominal pain, persistent.  Titrate dilaudid PCA.    Pulmonary embolus "Possible small pulmonary embolism identified in a left lower lobe posterior basilar segmental branch" CT scan 1/26. Duplex LE  negative for DVT during this admission.  No further A/C.    CAD (coronary artery disease) s/p DES 2006 stable. Aspirin held due to bleeding.  Other oral medications have been stopped due to patient's inability to tolerate PO medications.    Hyponatremia due to malignancy.  Diabetes mellitus CBG well controlled, A1c 8 last month. Initially placed on SSI, but this was discontinued due to low normal CBG. Lantus and metformin are being held.   Protein-calorie malnutrition, severe. Continued nutrition supplements.   Anemia of chronic disease hgb decreased from baseline but stable.   Weakness generalized. Home hospice arranged with home equipment.   Constipation due to narcotics. Resolved with bisacodyl after which he had diarrhea.  Using rectal suppository or enemas now prn constipation.   Diet:  Regular  Access:  port IVF:  yes Proph:  lovenox  Code Status: Partial Family Communication: patient, wife, and oldest daughter Disposition Plan:  Home with hospice tomorrow.   Consultants:  Oncology, Dr. Marin Olp  Pulmonology  Procedures:  CXR  Antibiotics:  none   HPI/Subjective:  AM:  Asking for help this morning because of difficulty breathing.  Felt very Blayton Huttner of breath.  Only his flank pain present this morning and fairly well controlled.  SOB most pressing problem.    PM:  Confused, restless, grunting and unable to answer questions  Objective: Filed Vitals:   06/01/13 2055 06/02/13 0518 06/02/13 0909 06/02/13 1334  BP: 121/72 149/82  139/90  Pulse: 98 105  100  Temp: 98.4 F (36.9 C) 97.8 F (36.6 C)  98.5 F (36.9 C)  TempSrc: Oral Oral  Oral  Resp: $Remo'16 18  18  'iLVfG$ Height:      Weight:      SpO2: 94% 91% 91% 92%    Intake/Output Summary (Last 24 hours) at 06/02/13 1711 Last data filed at 06/02/13 1500  Gross per 24 hour  Intake   1388 ml  Output    650 ml  Net    738 ml   Filed Weights   05/31/13 1316  Weight: 83.553 kg (184 lb 3.2 oz)     Exam:   General:  Cachectic CM, moderate respiratory distress, initially with full chest retractions, this afternoon with grunting.    HEENT:  NCAT, MMM  Cardiovascular:  Tachycardic RR, nl S1, S2 no mrg, 2+ pulses, warm extremities  Respiratory:  Diminished bilateral bases and to the mid-back on the right.  Some BS heard at right apex but still less than left side, no increased WOB, stable from yesterday  Abdomen:   NABS, soft, NT/ND, palpable mass left abdomen  MSK:   Normal tone and bulk, 2+ RLE and 1+ LLE edema  Neuro:  Grossly intact  Data Reviewed: Basic Metabolic Panel:  Recent Labs Lab 05/20/2013 2000 05/29/13 1320 05/30/13 0600  NA 133* 136* 133*  K 3.9 4.1 3.9  CL 94* 98 95*  CO2 $Re'27 29 28  'eIu$ GLUCOSE 113* 146* 97  BUN $Re'11 10 9  'fQl$ CREATININE 0.34* 0.43* 0.36*  CALCIUM 8.2* 8.2* 8.2*   Liver Function Tests:  Recent Labs Lab 05/25/2013 2000  AST 34  ALT 33  ALKPHOS 97  BILITOT 0.3  PROT 5.6*  ALBUMIN 1.9*    Recent  Labs Lab 05/20/2013 2000  LIPASE 7*   No results found for this basename: AMMONIA,  in the last 168 hours CBC:  Recent Labs Lab 06/08/2013 2000 05/29/13 1320 05/30/13 0600 05/31/13 0544  WBC 21.8* 17.8* 18.7* 17.8*  NEUTROABS 19.0*  --   --   --   HGB 9.7* 10.1* 9.4* 9.4*  HCT 29.8* 31.0* 28.8* 28.2*  MCV 84.7 86.6 85.5 85.2  PLT 355 321 328 327   Cardiac Enzymes: No results found for this basename: CKTOTAL, CKMB, CKMBINDEX, TROPONINI,  in the last 168 hours BNP (last 3 results)  Recent Labs  05/04/13 1135  PROBNP 542.0*   CBG:  Recent Labs Lab 05/31/13 2241 06/01/13 0711 06/01/13 1152 06/01/13 1643 06/01/13 2221  GLUCAP 120* 107* 128* 139* 130*    Recent Results (from the past 240 hour(s))  CLOSTRIDIUM DIFFICILE BY PCR     Status: None   Collection Time    05/31/13 10:43 AM      Result Value Ref Range Status   C difficile by pcr NEGATIVE  NEGATIVE Final   Comment: Performed at [facility]      Studies: No results found.  Scheduled Meds: . lidocaine  1 patch Transdermal Q24H  . lidocaine-prilocaine  1 application Topical Once   Continuous Infusions: . sodium chloride 20 mL/hr at 06/02/13 0641  . HYDROmorphone 1 mg/hr (06/02/13 1650)    Principal Problem:   Pancreatic cancer with malignant pleural effusions and ascites Active Problems:   CAD (coronary artery disease)   Hyponatremia   Diabetes mellitus type 2 in nonobese   Protein-calorie malnutrition, severe   Pulmonary embolus   Anemia of chronic disease   Pleuritic chest pain   Weakness generalized   Pleural effusion, malignant   Pleural effusion on right    Time spent: 30 min    Mishelle Hassan, [facility] Pager [phone]. If 7PM-7AM, please contact night-coverage at [email or web address], password [facility] 06/02/2013, 5:11 PM  LOS: 5 days

## 2013-06-02 NOTE — Progress Notes (Signed)
Jimmy Christensen is to be discharged today. Hospice will see him at home. He has had bloody drainage from his Pleurx catheter. 400 cc were removed yesterday.  He is off anticoagulation now. I agree with this. He may have had a small pulmonary embolism. I think the risk of bleeding would be significant and would be damaging to him.  He'll be going home with a Dilaudid infusion pump. Again, is that they will help him out with respect to pain issues.  His appetite is marginal at best. I just don't think he'll ever eat that much. 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  Overall, his performance status is ECOG 3-4.  Again, I don't think that his prognosis will be more than 6 weeks.  I know that hospice will do a great job in comfort measures.  I left a message for his wife on her cell phone.  I still do not feel that he is a candidate for any intervention for his pancreatic cancer. He just has a poor performance status. His comfort, respect, dignity are what is important now.  I talked with Jimmy Christensen today. He is okay and comfortable with focusing on quality-of-life issues.  I will more than happy to help out in any way once he gets home.  I do feel bad that there is now more that we can do to try to help his malignancy.   Lum Keas  2 Timothy 4:6-8

## 2013-06-02 NOTE — Progress Notes (Signed)
Follow-up: on visit pt very pale, diaphoretic, RR 28-30 using accessory muscles; Dr Sheran Fava seeing pt; she informed writer discharge on hold at this time and staff trying to get symptoms managed; Dr Sheran Fava to speak with pt's wife. [facility] notified; [facility] representative Carolynn Sayers notified; [facility] will continue to follow to assist as needed   Danton Sewer, RN 06/02/2013, 11:07 AM [facility] Liaison [phone]

## 2013-06-02 NOTE — Progress Notes (Signed)
Palliative Care Team at [facility]: Now unresponsive. Wife at bedside, his children are in route. Dilaudid gtt at 1mg /hr. Events over the last 24 hour noted-dramatic change today with onset of acute agitation and dyspnea throughout the day.    OBJECTIVE: Vital Signs: BP 118/72  Pulse 114  Temp(Src) 98.1 F (36.7 C) (Oral)  Resp 18  Ht 6\' 4"  (1.93 m)  Wt 83.553 kg (184 lb 3.2 oz)  BMI 23.64 kg/m2  SpO2 92%   Intake and Output: 02/16 0701 - 02/17 0700 In: 881.7 [P.O.:480; I.V.:281.7] Out: -   Physical Exam: Unresponsive. Muscle jerking. Developing rhonchi and course upper airway congestion. No breath sounds on his right lung field. Worsening edema in his hands bilaterally. No grimace. Abdomen distended.  No Known Allergies  Medications: Scheduled Meds:  . lidocaine  1 patch Transdermal Q24H  . lidocaine-prilocaine  1 application Topical Once    Continuous Infusions: . sodium chloride 20 mL/hr (06/02/13 2031)  . HYDROmorphone 1 mg/hr (06/02/13 1650)    PRN Meds: acetaminophen, bisacodyl, docusate sodium, hydrocortisone, HYDROmorphone, LORazepam, ondansetron (ZOFRAN) IV, ondansetron, sodium chloride  Palliative Performance Scale: 10 %   ASSESSMENT/ PLAN: Jimmy Christensen is actively dying of metastatic pancreatic cancer after more than a year long battle involving multiple rounds of chemotherapy and radiation. He has a large right malignant effusion that has been requiring daily drainage. Intense pleuritic chest pain and abdominal pain. He is immobile and malnourished.   He has clearly turned a corner today in terms of progressing towards EOL. He is too unstable to transport home at this point. The compassionate and humane plan should be to allow for a hospital death to occur with a high level of symptom management and support family here.  Titrate Dilaudid gtt for comfort. Use Ativan for agitation and myoclonus. Will add a scop  patch-upper airway secretions are worsening.  His wife was at the bedside, I provided and update on his medical condition and plan moving forward. Additionally I provided grief support.  Lane Hacker, DO Palliative Medicine  35 minutes. Greater than 50%  of this time was spent counseling and coordinating care related to the above assessment and plan.   Acquanetta Chain, DO  06/02/2013, 10:15 PM  Please contact Palliative Medicine Team phone at [phone] for questions and concerns.

## 2013-06-03 DIAGNOSIS — D638 Anemia in other chronic diseases classified elsewhere: Secondary | ICD-10-CM

## 2013-06-03 DIAGNOSIS — J9620 Acute and chronic respiratory failure, unspecified whether with hypoxia or hypercapnia: Secondary | ICD-10-CM

## 2013-06-14 NOTE — Care Management Note (Signed)
Cm spoke with patient's spouse at the bedside concerning pt's disposition. Pt previously arranging disposition home  With hospice. Per pt's spouse in pt's current state unable to care for pt at home with hospice services. MD to assess if patient able to transition into residential hospice facility.    Venita Lick Tersa Fotopoulos,MSN,RN [phone]

## 2013-06-14 NOTE — Progress Notes (Signed)
Patient GY:KZLD Fini      DOB: 1956/02/27      JTT:[id number]   Palliative Medicine Team at [facility] Progress Note    Subjective:  Patient softly vocalizing.  Wife Maggie at the bedside with daughter from out of town.  Maggie remembered me from last visit.  They were never able to get back to [facility].     Filed Vitals:   05/27/2013 0515  BP: 136/75  Pulse: 124  Temp: 98.8 F (37.1 C)  Resp: 20   Physical exam:  General: unresponsive but moaning Pupils not examined Chest decreased with bilateral upper rhonchi CVS: tachycardic, S1, S2 Abd: scaphoid, soft, not tender Ext: not mottled but cool Neuro: unresponsive   Assessment and plan: 58 yr old with metastatic pancreatic cancer being treated at [facility] . Patient presented to the hospital with worsening chest pain.  Patient with significant decline. Patient had been accepted into hospice at home.  Patient had change in status and was not able to discharge to home.  Required dilaudid drip for symptom management at the end of life. Patient appears near death.  Emotional support offered to spouse and daughter at the bedside.  1.  DNR  2.  Comfort care: continue dilaudid drip and prn meds for comfort.  3.  Agitation/Moaninig:  Prn ativan at his spouses discretion.   Total time: 15 min   Jazzlynn Rawe L. Lovena Le, MD MBA The Palliative Medicine Team at [facility] Phone: [phone] Pager: [phone]

## 2013-06-14 NOTE — Progress Notes (Signed)
Follow-up: pt with significant decline overnight, seen at bedside minimally responsive now with 100% NRB mask; RR=24, moaning intermittently, diaphoretic, warm to touch, hands with edema, elevated on pillows, audible upper airway congestion, wife at bedside stated both of her daughters, Fara Chute (from [place]) and Tiffany [facility]) on the way to the hospital; she is hopeful Jonelle Sidle will be in later this afternoon; wife shared how fast this change has been, "I thought I'd have more time" talked about the fact that she had not made any decisions on arrangements and she asked about FH/cremation - list of local agencies given to wife; information on [facility] grief counseling also shared; wife aware patient's time is very short dicussed current comfort medications in place - discussed with staff RN Alyse Low who is aware wife waiting on children to arrive and will continue to assess pt's comfort level- currently on 1 mg IV Dilaudid continuous; and has Ativan PRN; emotional support offered, asked if wife would like hospital chaplain visit , she declined at this time; Probation officer asked secretary Caren Griffins to request comfort cart   [facility] notified of change in pt condition; appears to be actively dying and per notes anticipating a hospital death; Dr Antonieta Pert office also notified of change in condition; [facility] Representative Pam notified and has collected CADD pump and IV Dilaudid that was previously ordered for home Please call if [facility] can be of further assistance Danton Sewer, RN 06/12/2013, 10:19 AM [facility] RN Liaison [phone]

## 2013-06-14 NOTE — Progress Notes (Signed)
Dilauded 40ml/25mg  wasted in sink from IV gtt bag. Witnessed by Senaida Ores, RN.Harlene Ramus

## 2013-06-14 NOTE — Progress Notes (Signed)
NT notifed RN that she believed patient had passed away. This RN confirmed patient death at 83. Verified with second RN, Wilkie Aye. Family support given. They are currently uncertain of arrangements and will follow up with [place] shortly.

## 2013-06-14 NOTE — Progress Notes (Signed)
CALLED TO ROOM BY NURSE TECH , PATIENT WITH LOW SAT 69% ON 4L, PLACED 50 %  MASK,  CALLED RESP TO SEE PT. Jimmy Christensen ,RN

## 2013-06-14 NOTE — Progress Notes (Signed)
Called to patient room for decreased SpO2. Upon arrival, patient remains unresponsive with SpO2 71% on .50 FiO2 via venturi mask. Placed on 100% Non rebreather mask. SpO2 climbed to 94%. Auscultation reveals rhonchi throughout most lung fields, but diminished in the right lower. RR 24-28, labored with expiratory grunting and use of accessory muscles. HR 131-145. Wife remains at bedside. RN at bedside.

## 2013-06-14 NOTE — Discharge Summary (Signed)
  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 [name]) and related malignant pleural effusion with right side pleur-x catheter placement, recent admission (on 05/04/2013 for trapped lung due to malignant pleural effusion), hypertension, DM who presented to [facility] ED with complaints of right sided chest pain at the pleur-x catheter site. During his hospital stay pt was seen by oncology and was told he was not a candidate for further chemo due to progressive decline. Pt was also seen by palliative care team for overall goals of care. Due to severe resp distress he was eventually put on dilaudid drip for comfort.   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  - oncology recommended no further chemotherapy.  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  Protein-calorie malnutrition, severe.  - secondary to progressive malignancy - no PO intake    Time: 2:57 pm 06/20/13  Signed:  Leisa Lenz  [facility] 06-20-2013, 2:57 PM

## 2013-06-14 DEATH — deceased

## 2014-10-12 IMAGING — CR DG CHEST 2V
3 series · 3 of 3 positions shown · non-contrast
Comparison: 05/15/2013

CLINICAL DATA: Increasing shortness of Breath

EXAM:
CHEST  2 VIEW

[w chest lat]
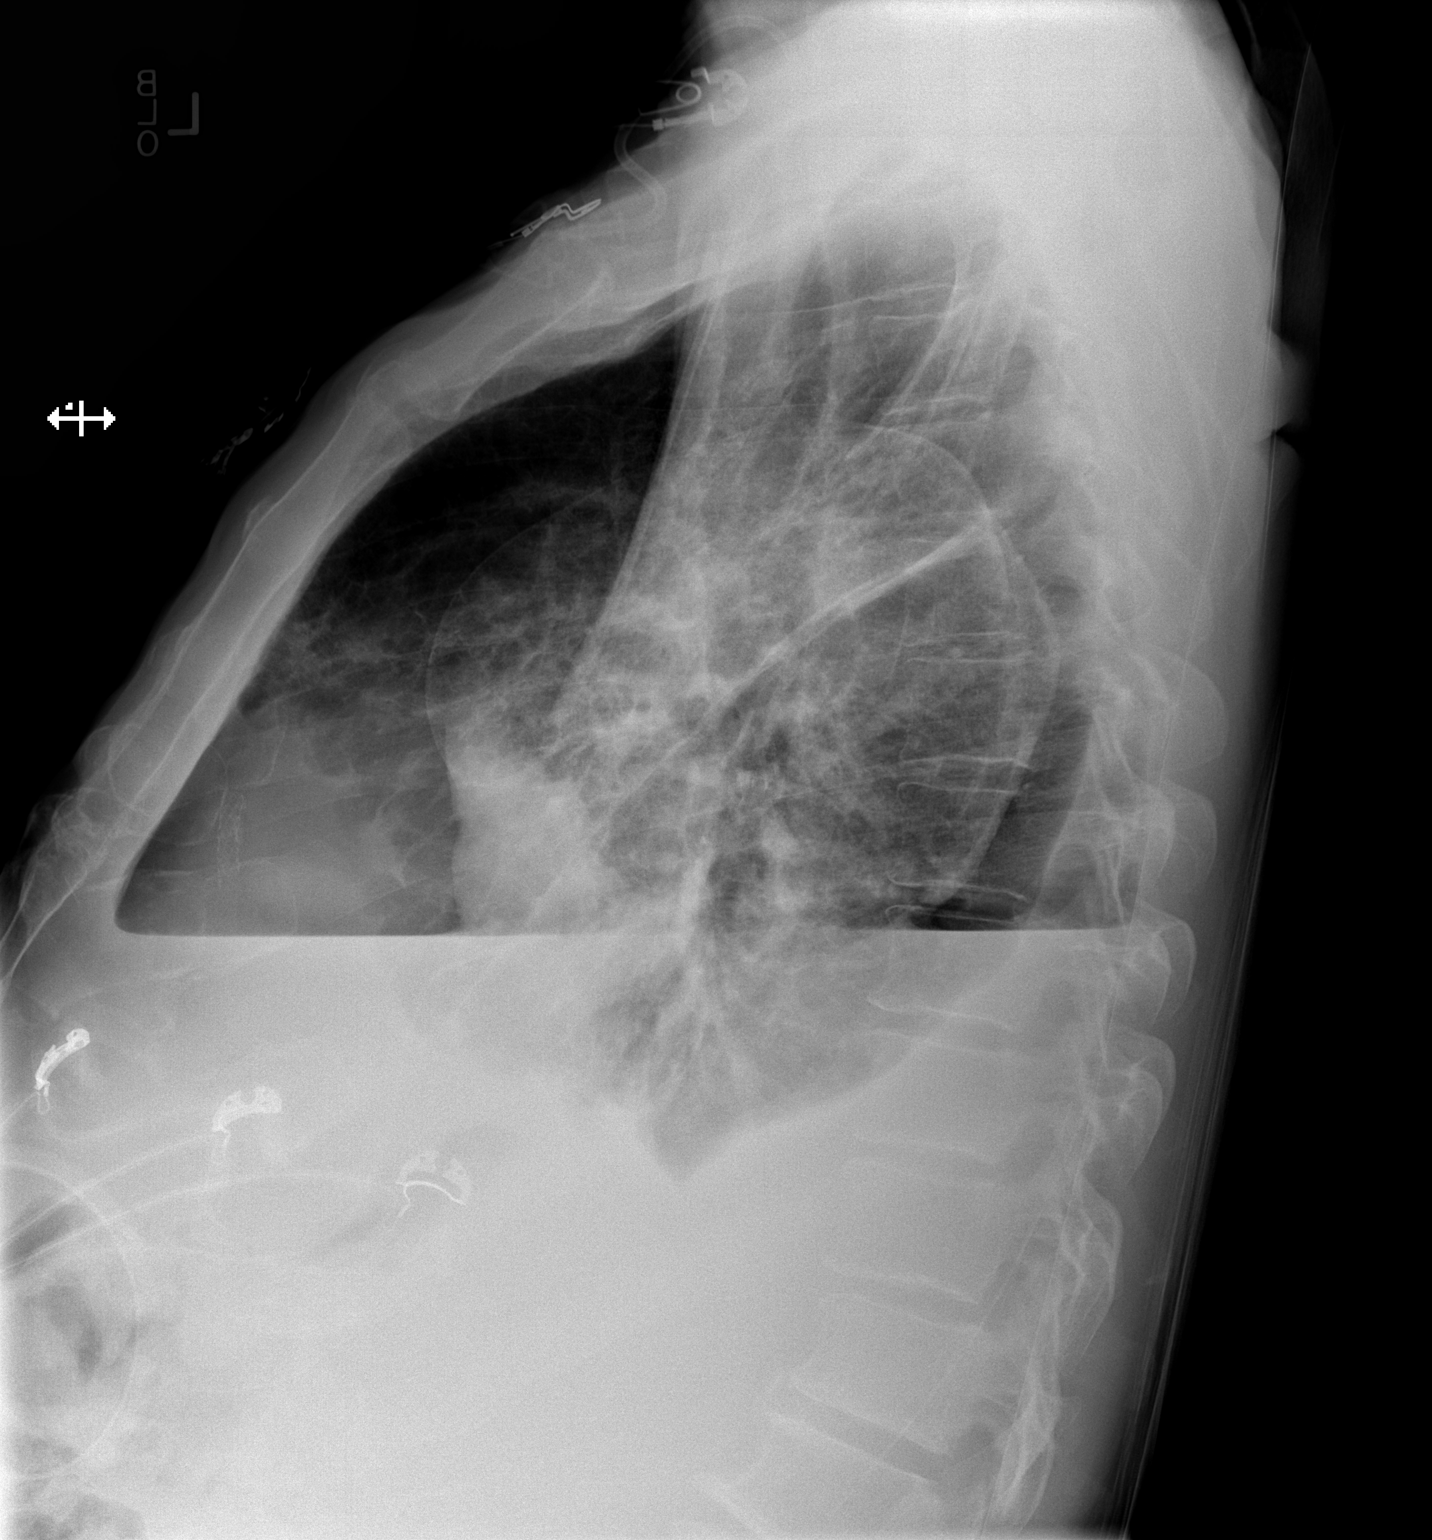

[x chest ap (1 of 2)]
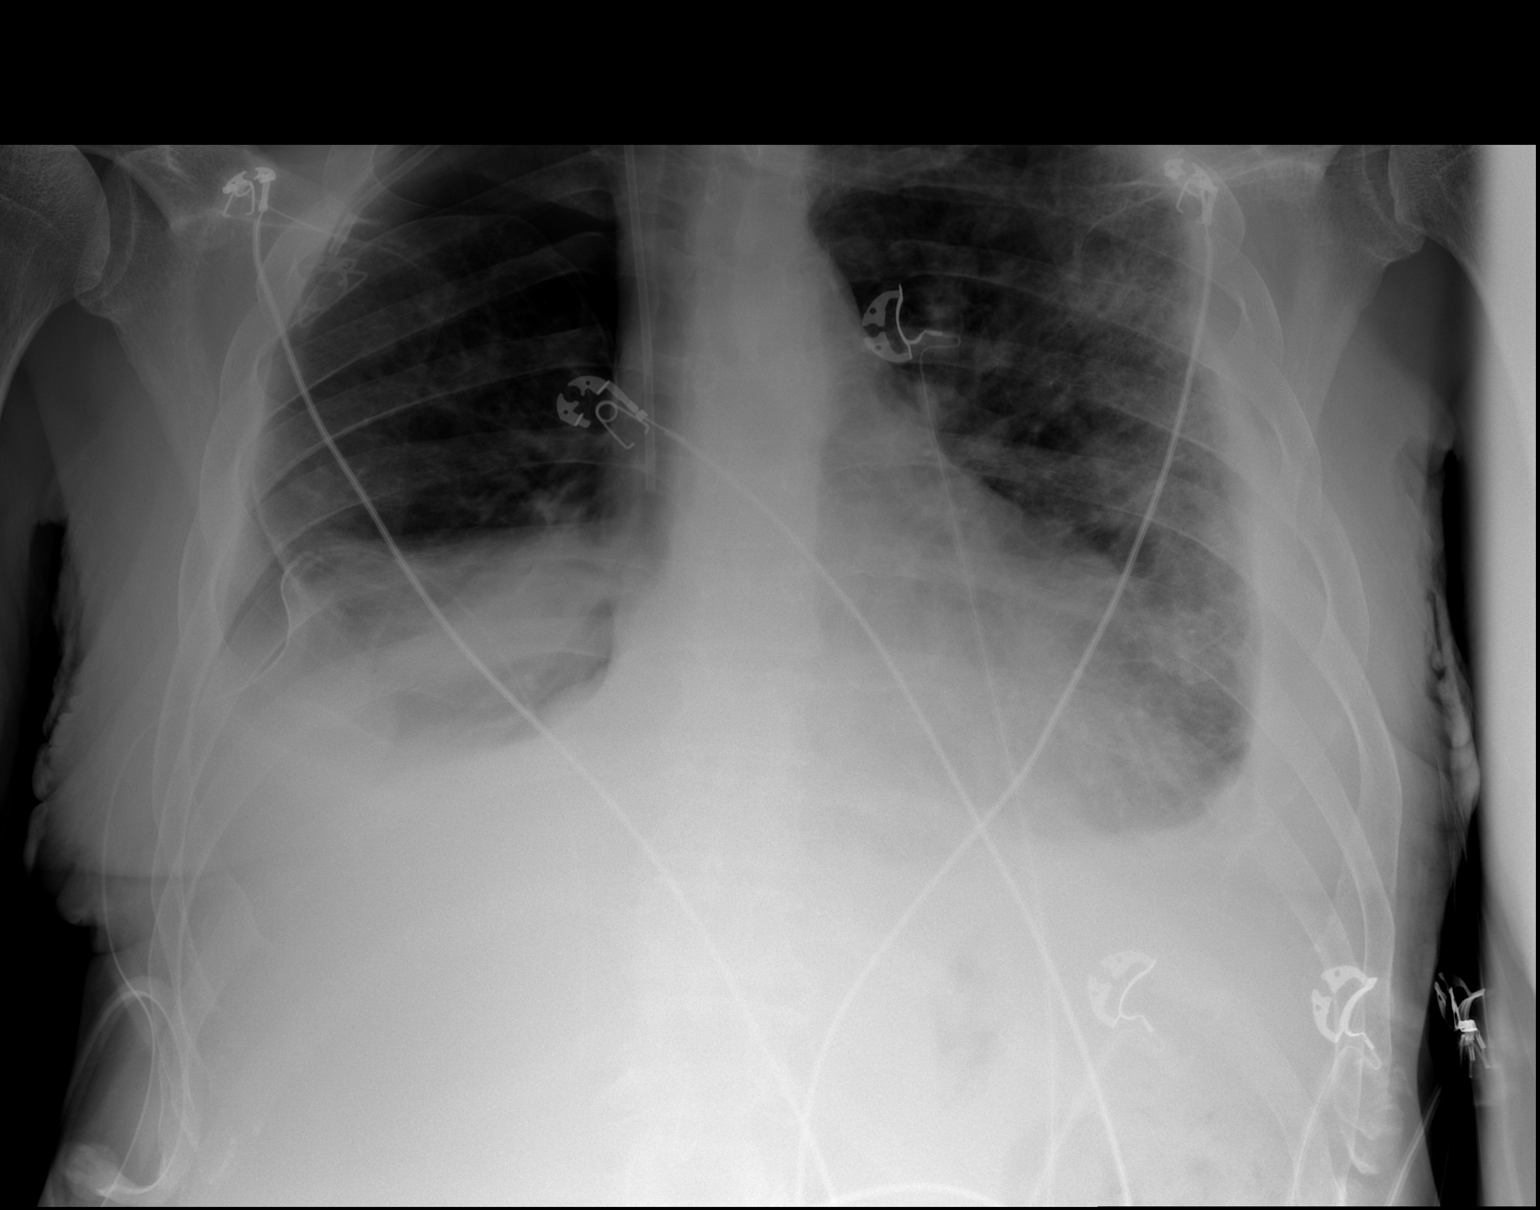

[x chest ap (2 of 2)]
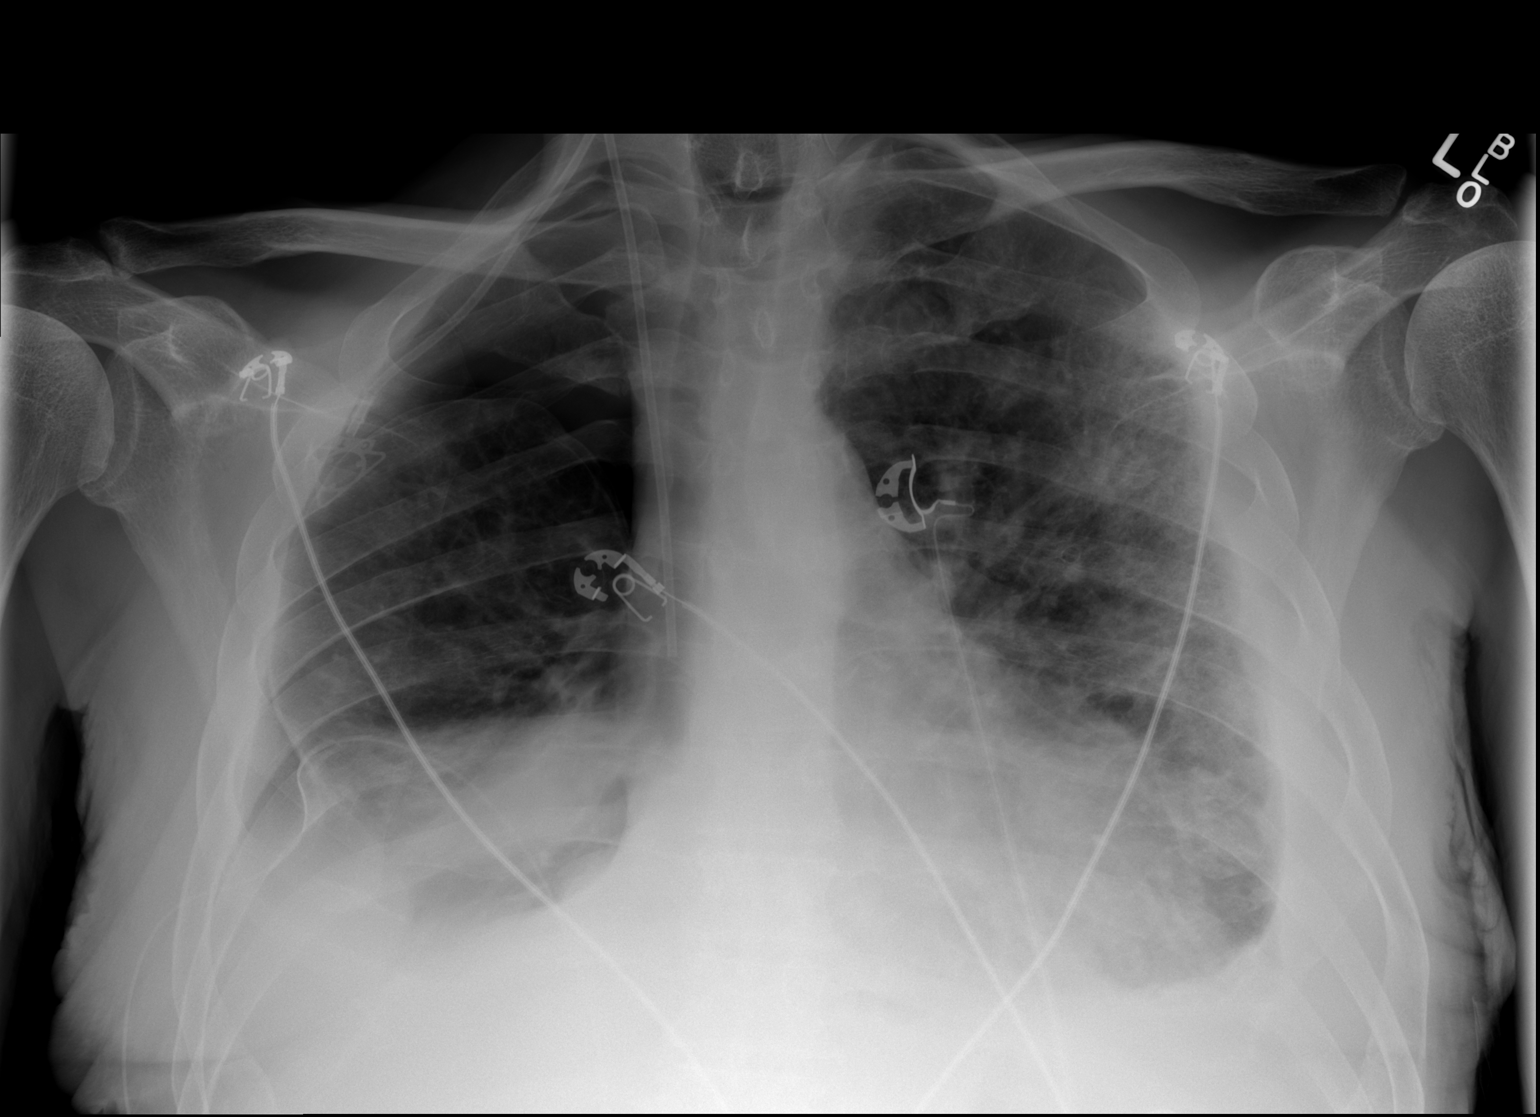

[3 of 3 positions shown; findings below may reference images not displayed]

FINDINGS: Right-sided chest wall port and right PleurX catheter are again
identified. There remains partial collapse of the right lung but
stable from the prior exam. The pleural fluid has increased slightly
on the right hand side. A left pleural effusion is noted which is
increased in the interval from the prior study. Patchy interstitial
changes are again noted within the left lung.
IMPRESSION: Increased bilateral pleural effusions.

Stable partial collapse of the right lung.

## 2014-10-13 IMAGING — CR DG CHEST 1V PORT
1 series · 1 of 1 positions shown · non-contrast
Comparison: 05/28/2013

CLINICAL DATA: Evaluate effusions

EXAM:
PORTABLE CHEST - 1 VIEW

[AP]
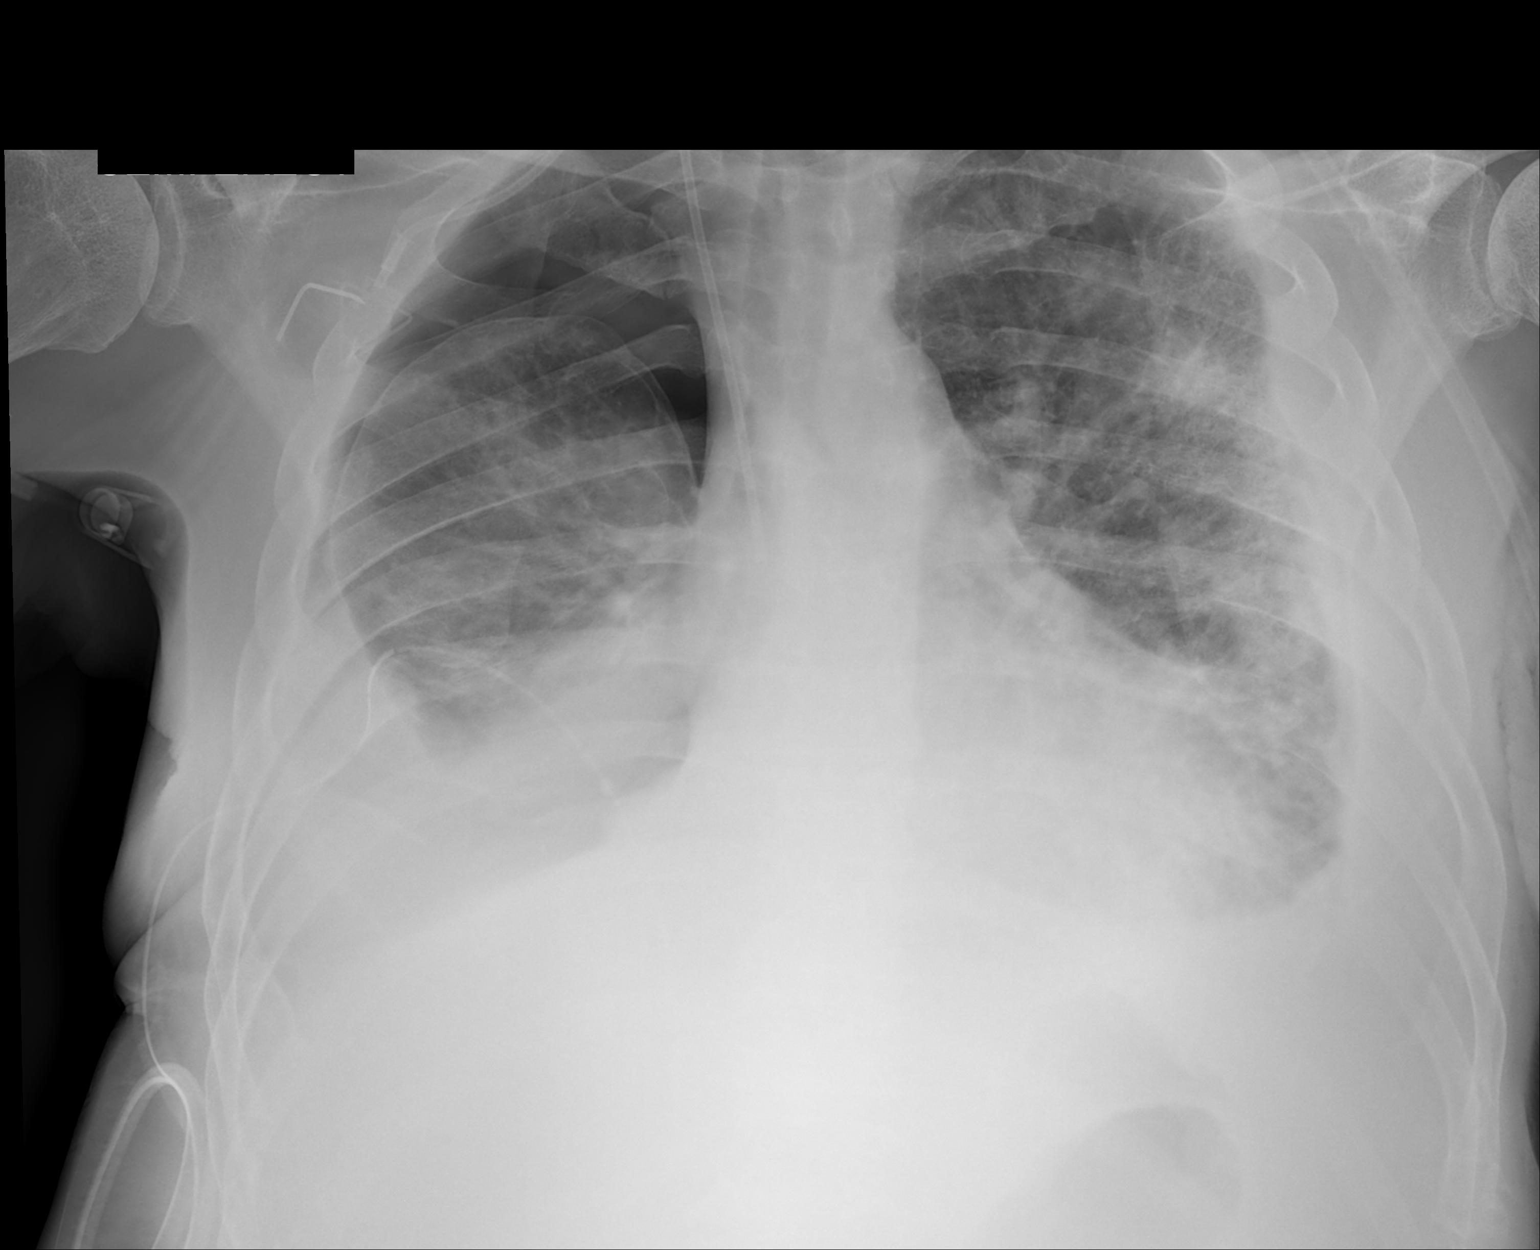

[1 of 1 positions shown; findings below may reference images not displayed]

FINDINGS: A right chest wall porta catheter is appreciated the and internal
jugular approach with tip projecting region of superior vena caval
right atrial junction. Stable partial collapse of the right lung is
appreciated. There is been increased size of the patient's right
pleural effusion. A chest tube is appreciated within the right lung
base. The left pleural effusion appears stable. Patchy infiltrates
identified within the left hemi thorax with increased confluence
within the lateral upper lobe region. The cardiac silhouette is
enlarged. The osseous structures are unremarkable.
IMPRESSION: Hydro pneumothorax on the right with increased size of the pleural
effusion component. The apical pneumothorax and lung collapse on the
right appear stable.

There is increased conspicuity of the interstitial infiltrate in the
left hemi thorax likely reflecting a component of pulmonary edema. A
focal area of atelectasis focal versus focal component is identified
within the lateral aspect of the left upper hemi thorax. The left
pleural effusion appears stable. Continued surveillance evaluation
is recommended.
